# Patient Record
Sex: Male | Born: 1985
Health system: Southern US, Community
[De-identification: ages and names within clinical notes are randomized; demographics above are authoritative.]

## PROBLEM LIST (undated history)

## (undated) DIAGNOSIS — G4730 Sleep apnea, unspecified: Secondary | ICD-10-CM

## (undated) DIAGNOSIS — I10 Essential (primary) hypertension: Secondary | ICD-10-CM

## (undated) DIAGNOSIS — Z22322 Carrier or suspected carrier of Methicillin resistant Staphylococcus aureus: Secondary | ICD-10-CM

## (undated) DIAGNOSIS — E669 Obesity, unspecified: Secondary | ICD-10-CM

## (undated) DIAGNOSIS — I509 Heart failure, unspecified: Secondary | ICD-10-CM

## (undated) HISTORY — DX: Heart failure, unspecified: I50.9

## (undated) HISTORY — DX: Sleep apnea, unspecified: G47.30

## (undated) HISTORY — PX: ADENOIDECTOMY: SUR15

## (undated) HISTORY — PX: TONSILLECTOMY: SUR1361

---

## 2010-10-23 ENCOUNTER — Emergency Department: Payer: Self-pay | Admitting: Unknown Physician Specialty

## 2011-09-17 ENCOUNTER — Emergency Department: Payer: Self-pay | Admitting: *Deleted

## 2013-07-12 ENCOUNTER — Emergency Department: Payer: Self-pay | Admitting: Emergency Medicine

## 2013-10-21 ENCOUNTER — Emergency Department: Payer: Self-pay | Admitting: Emergency Medicine

## 2013-10-21 LAB — CBC
HCT: 43.6 % (ref 40.0–52.0)
HGB: 14.3 g/dL (ref 13.0–18.0)
MCH: 27.7 pg (ref 26.0–34.0)
MCHC: 32.9 g/dL (ref 32.0–36.0)
MCV: 84 fL (ref 80–100)
PLATELETS: 299 10*3/uL (ref 150–440)
RBC: 5.16 10*6/uL (ref 4.40–5.90)
RDW: 13.8 % (ref 11.5–14.5)
WBC: 10.5 10*3/uL (ref 3.8–10.6)

## 2013-10-21 LAB — BASIC METABOLIC PANEL
Anion Gap: 7 (ref 7–16)
BUN: 14 mg/dL (ref 7–18)
CALCIUM: 8.6 mg/dL (ref 8.5–10.1)
Chloride: 104 mmol/L (ref 98–107)
Co2: 25 mmol/L (ref 21–32)
Creatinine: 0.52 mg/dL — ABNORMAL LOW (ref 0.60–1.30)
EGFR (African American): >60
EGFR (Non-African Amer.): >60
Glucose: 115 mg/dL — ABNORMAL HIGH (ref 65–99)
OSMOLALITY: 273 (ref 275–301)
POTASSIUM: 3.9 mmol/L (ref 3.5–5.1)
Sodium: 136 mmol/L (ref 136–145)

## 2013-10-21 LAB — TROPONIN I: Troponin-I: <0.02 ng/mL

## 2014-06-21 ENCOUNTER — Emergency Department: Payer: Self-pay | Admitting: Emergency Medicine

## 2019-02-20 ENCOUNTER — Other Ambulatory Visit: Payer: Self-pay

## 2019-02-20 ENCOUNTER — Encounter: Payer: Self-pay | Admitting: Emergency Medicine

## 2019-02-20 DIAGNOSIS — Z800 Family history of malignant neoplasm of digestive organs: Secondary | ICD-10-CM

## 2019-02-20 DIAGNOSIS — Z1159 Encounter for screening for other viral diseases: Secondary | ICD-10-CM

## 2019-02-20 DIAGNOSIS — F17210 Nicotine dependence, cigarettes, uncomplicated: Secondary | ICD-10-CM | POA: Diagnosis present

## 2019-02-20 DIAGNOSIS — L89329 Pressure ulcer of left buttock, unspecified stage: Secondary | ICD-10-CM | POA: Diagnosis present

## 2019-02-20 DIAGNOSIS — Z6841 Body Mass Index (BMI) 40.0 and over, adult: Secondary | ICD-10-CM

## 2019-02-20 DIAGNOSIS — Z811 Family history of alcohol abuse and dependence: Secondary | ICD-10-CM

## 2019-02-20 DIAGNOSIS — L0231 Cutaneous abscess of buttock: Secondary | ICD-10-CM | POA: Diagnosis present

## 2019-02-20 DIAGNOSIS — Z833 Family history of diabetes mellitus: Secondary | ICD-10-CM

## 2019-02-20 DIAGNOSIS — L731 Pseudofolliculitis barbae: Secondary | ICD-10-CM | POA: Diagnosis present

## 2019-02-20 DIAGNOSIS — L89324 Pressure ulcer of left buttock, stage 4: Principal | ICD-10-CM | POA: Diagnosis present

## 2019-02-20 DIAGNOSIS — L03317 Cellulitis of buttock: Secondary | ICD-10-CM | POA: Diagnosis present

## 2019-02-20 DIAGNOSIS — I10 Essential (primary) hypertension: Secondary | ICD-10-CM | POA: Diagnosis present

## 2019-02-20 LAB — CBC WITH DIFFERENTIAL/PLATELET
Abs Immature Granulocytes: 0.07 10*3/uL (ref 0.00–0.07)
Basophils Absolute: 0.1 10*3/uL (ref 0.0–0.1)
Basophils Relative: 1 %
Eosinophils Absolute: 0.3 10*3/uL (ref 0.0–0.5)
Eosinophils Relative: 2 %
HCT: 43.4 % (ref 39.0–52.0)
Hemoglobin: 14.2 g/dL (ref 13.0–17.0)
Immature Granulocytes: 1 %
Lymphocytes Relative: 17 %
Lymphs Abs: 2.6 10*3/uL (ref 0.7–4.0)
MCH: 28 pg (ref 26.0–34.0)
MCHC: 32.7 g/dL (ref 30.0–36.0)
MCV: 85.4 fL (ref 80.0–100.0)
Monocytes Absolute: 1.4 10*3/uL — ABNORMAL HIGH (ref 0.1–1.0)
Monocytes Relative: 9 %
Neutro Abs: 10.9 10*3/uL — ABNORMAL HIGH (ref 1.7–7.7)
Neutrophils Relative %: 70 %
Platelets: 332 10*3/uL (ref 150–400)
RBC: 5.08 MIL/uL (ref 4.22–5.81)
RDW: 13.5 % (ref 11.5–15.5)
WBC: 15.4 10*3/uL — ABNORMAL HIGH (ref 4.0–10.5)
nRBC: 0 % (ref 0.0–0.2)

## 2019-02-20 NOTE — ED Triage Notes (Addendum)
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

## 2019-02-21 ENCOUNTER — Emergency Department: Payer: Self-pay

## 2019-02-21 ENCOUNTER — Inpatient Hospital Stay
Admission: EM | Admit: 2019-02-21 | Discharge: 2019-02-22 | DRG: 571 | Disposition: A | Discharge status: Other Acute Inpatient Hospital | Payer: Self-pay | Attending: Internal Medicine | Admitting: Internal Medicine

## 2019-02-21 ENCOUNTER — Encounter: Payer: Self-pay | Admitting: Radiology

## 2019-02-21 DIAGNOSIS — L03317 Cellulitis of buttock: Secondary | ICD-10-CM | POA: Diagnosis present

## 2019-02-21 DIAGNOSIS — L8990 Pressure ulcer of unspecified site, unspecified stage: Secondary | ICD-10-CM

## 2019-02-21 DIAGNOSIS — L0231 Cutaneous abscess of buttock: Secondary | ICD-10-CM

## 2019-02-21 HISTORY — DX: Obesity, unspecified: E66.9

## 2019-02-21 HISTORY — DX: Essential (primary) hypertension: I10

## 2019-02-21 LAB — LACTIC ACID, PLASMA: Lactic Acid, Venous: 1.2 mmol/L (ref 0.5–1.9)

## 2019-02-21 LAB — BASIC METABOLIC PANEL
Anion gap: 8 (ref 5–15)
BUN: 16 mg/dL (ref 6–20)
CO2: 26 mmol/L (ref 22–32)
Calcium: 8.5 mg/dL — ABNORMAL LOW (ref 8.9–10.3)
Chloride: 102 mmol/L (ref 98–111)
Creatinine, Ser: 0.57 mg/dL — ABNORMAL LOW (ref 0.61–1.24)
GFR calc Af Amer: >60 mL/min (ref >60)
GFR calc non Af Amer: >60 mL/min (ref >60)
Glucose, Bld: 144 mg/dL — ABNORMAL HIGH (ref 70–99)
Potassium: 3.5 mmol/L (ref 3.5–5.1)
Sodium: 136 mmol/L (ref 135–145)

## 2019-02-21 LAB — TSH: TSH: 2.074 u[IU]/mL (ref 0.350–4.500)

## 2019-02-21 LAB — HEMOGLOBIN A1C
Hgb A1c MFr Bld: 5.4 % (ref 4.8–5.6)
Mean Plasma Glucose: 108.28 mg/dL

## 2019-02-21 LAB — SARS CORONAVIRUS 2 BY RT PCR (HOSPITAL ORDER, PERFORMED IN ~~LOC~~ HOSPITAL LAB): SARS Coronavirus 2: NEGATIVE

## 2019-02-21 MED ORDER — ACETAMINOPHEN 325 MG PO TABS: 650.0000 mg | ORAL_TABLET | Freq: Four times a day (QID) | ORAL | Status: DC | PRN

## 2019-02-21 MED ORDER — ONDANSETRON HCL 4 MG PO TABS: 4.0000 mg | ORAL_TABLET | Freq: Four times a day (QID) | ORAL | Status: DC | PRN

## 2019-02-21 MED ORDER — ENOXAPARIN SODIUM 40 MG/0.4ML ~~LOC~~ SOLN
40.0000 mg | SUBCUTANEOUS | Status: DC
  Administered 2019-02-21: 40 mg via SUBCUTANEOUS
  Filled 2019-02-21: qty 0.4

## 2019-02-21 MED ORDER — CLINDAMYCIN PHOSPHATE 600 MG/50ML IV SOLN
600.0000 mg | Freq: Once | INTRAVENOUS | Status: AC
  Administered 2019-02-21: 600 mg via INTRAVENOUS
  Filled 2019-02-21: qty 50

## 2019-02-21 MED ORDER — PIPERACILLIN SOD-TAZOBACTAM SO 2.25 (2-0.25) G IV SOLR
4.5000 g | Freq: Three times a day (TID) | INTRAVENOUS | Status: DC
  Administered 2019-02-21: 4.5 g via INTRAVENOUS
  Filled 2019-02-21 (×3): qty 20

## 2019-02-21 MED ORDER — VANCOMYCIN HCL 10 G IV SOLR: 2500.0000 mg | Freq: Two times a day (BID) | INTRAVENOUS | Status: DC

## 2019-02-21 MED ORDER — OXYCODONE HCL 5 MG PO TABS
5.0000 mg | ORAL_TABLET | Freq: Four times a day (QID) | ORAL | Status: DC | PRN
  Administered 2019-02-21 – 2019-02-22 (×2): 5 mg via ORAL
  Filled 2019-02-21 (×2): qty 1

## 2019-02-21 MED ORDER — NICOTINE 14 MG/24HR TD PT24
14.0000 mg | MEDICATED_PATCH | Freq: Every day | TRANSDERMAL | Status: DC
  Filled 2019-02-21: qty 1

## 2019-02-21 MED ORDER — VANCOMYCIN HCL 10 G IV SOLR
2500.0000 mg | Freq: Two times a day (BID) | INTRAVENOUS | Status: DC
  Filled 2019-02-21: qty 2500

## 2019-02-21 MED ORDER — PIPERACILLIN-TAZOBACTAM 3.375 G IVPB 30 MIN
3.3750 g | Freq: Once | INTRAVENOUS | Status: DC
  Filled 2019-02-21: qty 50

## 2019-02-21 MED ORDER — DOCUSATE SODIUM 100 MG PO CAPS
100.0000 mg | ORAL_CAPSULE | Freq: Two times a day (BID) | ORAL | Status: DC
  Administered 2019-02-21 (×2): 100 mg via ORAL
  Filled 2019-02-21 (×2): qty 1

## 2019-02-21 MED ORDER — MORPHINE SULFATE (PF) 4 MG/ML IV SOLN
4.0000 mg | Freq: Once | INTRAVENOUS | Status: AC
  Administered 2019-02-21: 4 mg via INTRAVENOUS
  Filled 2019-02-21: qty 1

## 2019-02-21 MED ORDER — ACETAMINOPHEN 650 MG RE SUPP: 650.0000 mg | Freq: Four times a day (QID) | RECTAL | Status: DC | PRN

## 2019-02-21 MED ORDER — LIDOCAINE-EPINEPHRINE 2 %-1:100000 IJ SOLN
30.0000 mL | Freq: Once | INTRAMUSCULAR | Status: AC
  Administered 2019-02-21: 30 mL
  Filled 2019-02-21: qty 2

## 2019-02-21 MED ORDER — IOHEXOL 300 MG/ML  SOLN
125.0000 mL | Freq: Once | INTRAMUSCULAR | Status: AC | PRN
  Administered 2019-02-21: 150 mL via INTRAVENOUS

## 2019-02-21 MED ORDER — CLINDAMYCIN PHOSPHATE 900 MG/50ML IV SOLN
900.0000 mg | Freq: Three times a day (TID) | INTRAVENOUS | Status: DC
  Administered 2019-02-21 – 2019-02-22 (×4): 900 mg via INTRAVENOUS
  Filled 2019-02-21 (×5): qty 50

## 2019-02-21 MED ORDER — PIPERACILLIN-TAZOBACTAM 4.5 G IVPB
4.5000 g | Freq: Three times a day (TID) | INTRAVENOUS | Status: DC
  Filled 2019-02-21 (×2): qty 100

## 2019-02-21 MED ORDER — ENOXAPARIN SODIUM 40 MG/0.4ML ~~LOC~~ SOLN
40.0000 mg | Freq: Two times a day (BID) | SUBCUTANEOUS | Status: DC
  Administered 2019-02-21: 22:00:00 40 mg via SUBCUTANEOUS
  Filled 2019-02-21: qty 0.4

## 2019-02-21 MED ORDER — VANCOMYCIN HCL 1.5 G IV SOLR
1500.0000 mg | Freq: Three times a day (TID) | INTRAVENOUS | Status: DC
  Administered 2019-02-21 – 2019-02-22 (×3): 1500 mg via INTRAVENOUS
  Filled 2019-02-21 (×5): qty 1500

## 2019-02-21 MED ORDER — ONDANSETRON HCL 4 MG/2ML IJ SOLN: 4.0000 mg | Freq: Four times a day (QID) | INTRAMUSCULAR | Status: DC | PRN

## 2019-02-21 MED ORDER — VANCOMYCIN HCL 10 G IV SOLR
2500.0000 mg | Freq: Once | INTRAVENOUS | Status: AC
  Administered 2019-02-21: 2500 mg via INTRAVENOUS
  Filled 2019-02-21: qty 2500

## 2019-02-21 MED ORDER — PIPERACILLIN-TAZOBACTAM 3.375 G IVPB
3.3750 g | Freq: Three times a day (TID) | INTRAVENOUS | Status: DC
  Administered 2019-02-21 – 2019-02-22 (×4): 3.375 g via INTRAVENOUS
  Filled 2019-02-21 (×4): qty 50

## 2019-02-21 NOTE — Progress Notes (Signed)
Pharmacy Antibiotic Note  Christopher Keith is a 33 y.o. male admitted on 02/21/2019 with wound infection.  Pharmacy has been consulted for Vancomycin, Zosyn, clindamycin dosing.  Pt is morbidly obese (BMI 72.7). Wound infection of left buttock, concerned for necrotizing fasciitis.   Plan: Will start Zosyn 3.375 gm IV Q8H.  Pt received vancomycin 2500 mg IV X 1 5/17 @0851 , will order a maintenance dose of 1500 mg q8H. Predicted AUC 410. Goal AUC 400-550. Calculated Css min 12.8. Used Scr 0.8 for calculations.  No peak or trough currently ordered.   Will order clindamycin 900 mg q8H due to concern for necrotizing infection.   Height: 5\' 7"  (170.2 cm) Weight: (!) 464 lb 9 oz (210.7 kg) IBW/kg (Calculated) : 66.1  Temp (24hrs), Avg:98.6 F (37 C), Min:98.4 F (36.9 C), Max:98.9 F (37.2 C)  Recent Labs  Lab 02/20/19 2346 02/21/19 0435  WBC 15.4*  --   CREATININE 0.57*  --   LATICACIDVEN  --  1.2    Estimated Creatinine Clearance: 225.9 mL/min (A) (by C-G formula based on SCr of 0.57 mg/dL (L)).    No Known Allergies  Antimicrobials this admission: 5/17 pip/tazo >> 5/17 vancomycin  >>  5/17 Clindamycin >>   Dose adjustments this admission: None  Microbiology results:  5/17 BCx: pending  Thank you for allowing pharmacy to be a part of this patient's care.  Ronnald Ramp, PharmD, BCPS 02/21/2019 10:10 AM

## 2019-02-21 NOTE — ED Notes (Signed)
Dr. Forbach at bedside.  

## 2019-02-21 NOTE — Procedures (Addendum)
ATTENDING Surgeon(s): Carolan Shiver, MD   ANESTHESIA: General   PRE-OPERATIVE DIAGNOSIS: left gluteal abscess with necrotic tissue   POST-OPERATIVE DIAGNOSIS: left gluteal abscess with necrotic tissue   PROCEDURE(S):  1.) Sharp excisional debridement of left gluteal infected and necrotic tissueulcer down to subcutaneous tissue    INTRAOPERATIVE FINDINGS:  A 6 cm x 4 cm and 1 cm deep stage four sacral ulcer without purulent secretions   ESTIMATED BLOOD LOSS: Minimal (<10 mL)      SPECIMENS: abscess culture   COMPLICATIONS: None apparent   CONDITION AT END OF PROCEDURE: Hemodynamically stable and awake   INDICATIONS FOR PROCEDURE:  Patient is 33 y.o. with left gluteal abscess with necrotic tissue and purulent discharge. Patient with leukocytosis and purulent secretions.      DETAILS OF PROCEDURE: After informed consent,patient was positioned. Time out performed. Local anesthesia infiltrated. Patient placed on right decubitus position. The left gluteal area was cleaned and draped in sterile fashion. With #11 blade, incision was done and purulent secretion drained.  Necrotic tissue from the was resected with scissors. Hemostasis achieved. Wet to dry dressing was applied. Patient tolerated the procedure well.

## 2019-02-21 NOTE — Consult Note (Signed)
SURGICAL CONSULTATION NOTE   HISTORY OF PRESENT ILLNESS (HPI):  33 y.o. male presented to [facility] ED for evaluation of left border abscess. Patient reports he has been feeling in it for pain since few days ago.  Reports that he has been trying to take care of it but is progressively getting worse.  Patient has tried to drain it very recently has minimal possible.  Denies fever chills.  Reports previous episode of scrotal abscess.  Pain localized in the left buttock area.  Pain does not radiate towards the body.  Pain aggravated by pressure.  There is no alleviating factor.  At ED patient found with leukocytosis.  CT scan of the pelvis shows phlegmonous process on the liver area but no drainable collection.  Surgery is consulted by Dr. York Cerise in this context for evaluation and management of left gluteal abscess.  PAST MEDICAL HISTORY (PMH):  Past Medical History:  Diagnosis Date  . Hypertension   . Obesity      PAST SURGICAL HISTORY [facility]):  Past Surgical History:  Procedure Laterality Date  . ADENOIDECTOMY    . TONSILLECTOMY       MEDICATIONS:  Prior to Admission medications   Not on File     ALLERGIES:  No Known Allergies   SOCIAL HISTORY:  Social History   Socioeconomic History  . Marital status: Married    Spouse name: Not on file  . Number of children: Not on file  . Years of education: Not on file  . Highest education level: Not on file  Occupational History  . Not on file  Social Needs  . Financial resource strain: Not on file  . Food insecurity:    Worry: Not on file    Inability: Not on file  . Transportation needs:    Medical: Not on file    Non-medical: Not on file  Tobacco Use  . Smoking status: Current Some Day Smoker    Types: Cigarettes  . Smokeless tobacco: Never Used  Substance and Sexual Activity  . Alcohol use: Yes  . Drug use: Never  . Sexual activity: Not on file  Lifestyle  . Physical activity:    Days per week: Not on file    Minutes  per session: Not on file  . Stress: Not on file  Relationships  . Social connections:    Talks on phone: Not on file    Gets together: Not on file    Attends religious service: Not on file    Active member of club or organization: Not on file    Attends meetings of clubs or organizations: Not on file    Relationship status: Not on file  . Intimate partner violence:    Fear of current or ex partner: Not on file    Emotionally abused: Not on file    Physically abused: Not on file    Forced sexual activity: Not on file  Other Topics Concern  . Not on file  Social History Narrative  . Not on file     FAMILY HISTORY:  History reviewed. No pertinent family history.   REVIEW OF SYSTEMS:  Constitutional: denies weight loss, fever, chills, or sweats  Eyes: denies any other vision changes, history of eye injury  ENT: denies sore throat, hearing problems  Respiratory: denies shortness of breath, wheezing  Cardiovascular: denies chest pain, palpitations  Gastrointestinal: denies abdominal pain, N/V, or diarrhea Genitourinary: denies burning with urination or urinary frequency Musculoskeletal: denies any other joint pains or  cramps  Skin: denies any other rashes or skin discolorations  Neurological: denies any other headache, dizziness, weakness  Psychiatric: denies any other depression, anxiety   All other review of systems were negative   VITAL SIGNS:  Temp:  [98.5 F (36.9 C)-98.9 F (37.2 C)] 98.5 F (36.9 C) (05/17 0252) Pulse Rate:  [85-92] 85 (05/17 0252) Resp:  [19-20] 20 (05/17 0252) BP: (158-180)/(74-97) 158/97 (05/17 0252) SpO2:  [98 %] 98 % (05/17 0252) Weight:  [210.7 kg] 210.7 kg (05/16 2335)     Height: 5\' 7"  (170.2 cm) Weight: (!) 210.7 kg BMI (Calculated): 72.74   PHYSICAL EXAM:  Constitutional:  --Morbid obesity -- Awake, alert, and oriented x3  Eyes:  -- Pupils equally round and reactive to light  -- No scleral icterus  Ear, nose, and throat:  -- No  jugular venous distension  Pulmonary:  -- No crackles  -- Equal breath sounds bilaterally -- Breathing non-labored at rest Cardiovascular:  -- S1, S2 present  -- No pericardial rubs Gastrointestinal:  -- Abdomen soft, nontender, non-distended, no guarding or rebound tenderness -- No abdominal masses appreciated, pulsatile or otherwise  Musculoskeletal and Integumentary:  -- Wounds or skin discoloration: Left gluteal area of necrotic tissue measure 4 x 6 cm.  Superficial necrotic tissue with purulent secretion.  There is no crepitus.  There is associated cellulitis. -- Extremities: B/L UE and LE FROM, hands and feet warm, no edema  Neurologic:  -- Motor function: intact and symmetric -- Sensation: intact and symmetric   Labs:  CBC Latest Ref Rng & Units 02/20/2019 10/21/2013  WBC 4.0 - 10.5 K/uL 15.4(H) 10.5  Hemoglobin 13.0 - 17.0 g/dL 16.114.2 09.614.3  Hematocrit 04.539.0 - 52.0 % 43.4 43.6  Platelets 150 - 400 K/uL 332 299   CMP Latest Ref Rng & Units 02/20/2019 10/21/2013  Glucose 70 - 99 mg/dL 409(W144(H) 119(J115(H)  BUN 6 - 20 mg/dL 16 14  Creatinine 4.780.61 - 1.24 mg/dL 2.95(A0.57(L) 2.13(Y0.52(L)  Sodium 135 - 145 mmol/L 136 136  Potassium 3.5 - 5.1 mmol/L 3.5 3.9  Chloride 98 - 111 mmol/L 102 104  CO2 22 - 32 mmol/L 26 25  Calcium 8.9 - 10.3 mg/dL 8.6(V8.5(L) 8.6     Imaging studies:  EXAM: CT PELVIS WITH CONTRAST  TECHNIQUE: Multidetector CT imaging of the pelvis was performed using the standard protocol following the bolus administration of intravenous contrast.  CONTRAST:  150mL OMNIPAQUE IOHEXOL 300 MG/ML  SOLN  COMPARISON:  None.  FINDINGS: Urinary Tract:  Bladder is within normal limits.  Bowel:  Visualized bowel is unremarkable.  Vascular/Lymphatic: No evidence of aneurysm.  Small retroperitoneal nodes, including 11 mm short axis left common iliac node (series 3/image 20). Small pelvic nodes bilaterally, including a 14 mm short axis left external iliac node (series 3/image 87),  these are mildly prominent although likely reactive.  Reproductive:  Prostate is unremarkable.  Other:  No pelvic ascites.  Musculoskeletal: Inflammatory stranding/phlegmonous change in the medial left buttock adjacent to the gluteal cleft (series 11/image 97). No discrete fluid collection/abscess.  Mild degenerative changes of the lower lumbar spine.  IMPRESSION: Inflammatory stranding/phlegmonous change in the medial left buttock adjacent to the gluteal cleft, reflecting cellulitis. No discrete fluid collection/abscess.  Small retroperitoneal/pelvic lymph nodes, likely reactive.   Electronically Signed   By: Charline BillsSriyesh  Krishnan M.D.   On: 02/21/2019 05:17  Assessment/Plan:  33 y.o. male with left gluteal abscess, complicated by pertinent comorbidities including morbid obesity of 72 of BMI. And within the  gluteal abscess and associated phlegmon with necrotic tissue.  Due to the amount of necrotic tissue and leukocytosis I assess that the patient will benefit of incision and drainage and debridement of the tissue.  I discussed the case with anesthesia and an upper comfortable performing anesthesia on this patient.  I discussed this with the patient and he approved to proceed with bedside debridement and incision and drainage.  I discussed with the patient the benefit of the procedure which will be control infection. After debridement of the area not having any more fluid collection that needs to be drained I recommend the patient to have IV antibiotic therapy for 24-48 hours for the cellulitis.  I will gladly follow the patient for wound care.  Gae Gallop, MD

## 2019-02-21 NOTE — Progress Notes (Signed)
See the patient at bedside. Vital signs are stable.  Labs reviewed. Left gluteal abscess with necrotic tissue Continue antibiotics, follow-up CBC. Morbid obesity.  Diet control and exercise advised. Tobacco abuse.  Smoking cessation was counseled for 3 to 4 minutes. I discussed with the patient and RN.  Time spent about 20 minutes.

## 2019-02-21 NOTE — ED Provider Notes (Signed)
[facility] Emergency Department Provider Note  ____________________________________________   First MD Initiated Contact with Patient 02/21/19 [phone]     (approximate)  I have reviewed the triage vital signs and the nursing notes.   HISTORY  Chief Complaint Abscess    HPI Christopher Keith is a 33 y.o. male with a history of morbid obesity but no other known diagnoses other than a prior episode about 7 months ago of an abscess on his scrotum.  He presents tonight with what he describes as a gradually worsening area of infection on his left buttock.  He reports that it is painful when he sits but otherwise it is not painful.  He has been trying to keep clean and scrubs himself including that area with a brush but he has a difficult time seeing it both due to the location and his size.  He finally came in tonight because it has been draining some fluid and it has become very painful when he sits down.  He required drainage of an abscess on his scrotum 7 months ago as described above but he said that there is no scrotal involvement this time and that the area is isolated to the left buttock.  He denies fever/chills, sore throat, chest pain, shortness of breath, cough, nausea, vomiting, abdominal pain, and dysuria.  He does not have a diagnosis of diabetes that he is aware of but he does not have a doctor  or receive regular medical care.  He rarely uses alcohol and occasionally smokes tobacco.  He thinks it may have started as an ingrown hair or another small infection and he was able to squeeze out some pus and blood a few days ago but it has gotten much bigger and much worse.  He now describes it as severe.        Past Medical History:  Diagnosis Date   Hypertension    Obesity     There are no active problems to display for this patient.   Past Surgical History:  Procedure Laterality Date   ADENOIDECTOMY     TONSILLECTOMY      Prior to Admission medications    Not on File    Allergies Patient has no known allergies.  History reviewed. No pertinent family history.  Social History Social History   Tobacco Use   Smoking status: Current Some Day Smoker    Types: Cigarettes   Smokeless tobacco: Never Used  Substance Use Topics   Alcohol use: Yes   Drug use: Never    Review of Systems Constitutional: No fever/chills Eyes: No visual changes. ENT: No sore throat. Cardiovascular: Denies chest pain. Respiratory: Denies shortness of breath. Gastrointestinal: No abdominal pain.  No nausea, no vomiting.  No diarrhea.  No constipation. Genitourinary: Negative for dysuria. Musculoskeletal: Negative for neck pain.  Negative for back pain. Integumentary: Large area of draining infection on his left buttock. Neurological: Negative for headaches, focal weakness or numbness.   ____________________________________________   PHYSICAL EXAM:  VITAL SIGNS: ED Triage Vitals  Enc Vitals Group     BP 02/20/19 2334 (!) 180/74     Pulse Rate 02/20/19 2334 92     Resp 02/20/19 2334 19     Temp 02/20/19 2334 98.9 F (37.2 C)     Temp Source 02/20/19 2334 Oral     SpO2 02/20/19 2334 98 %     Weight 02/20/19 2335 (!) 210.7 kg (464 lb 9 oz)     Height 02/20/19 2335  1.702 m ( )     Head Circumference --      Peak Flow --      Pain Score 02/20/19 2335 4     Pain Loc --      Pain Edu? --      Excl. in GC? --     Constitutional: Alert and oriented. Well appearing and in no acute distress. Eyes: Conjunctivae are normal.  Head: Atraumatic. Nose: No congestion/rhinnorhea. Mouth/Throat: Mucous membranes are moist. Neck: No stridor.  No meningeal signs.   Cardiovascular: Normal rate, regular rhythm. Good peripheral circulation. Grossly normal heart sounds. Respiratory: Normal respiratory effort.  No retractions. No audible wheezing. Gastrointestinal: Morbid obesity.  Soft and nontender. No distention.  GU: Normal-appearing scrotum and  penis.  No erythema, edema, tenderness, or crepitus identified in and around the scrotum nor perineum. Musculoskeletal: No lower extremity tenderness nor edema. No gross deformities of extremities. Neurologic:  Normal speech and language. No gross focal neurologic deficits are appreciated.  Psychiatric: Mood and affect are normal. Speech and behavior are normal. Skin:  Skin is warm, dry and intact except for the left buttock.  He has a very large area of induration that is at least 10 to 12 cm in diameter and in width with a slightly smaller area of erythema and central purulence and fluctuance.  It is isolated to the left buttock but it takes up most of the buttock itself.  It does not track to his anus.  In addition to the fluctuance and induration and surrounding cellulitis, there is a large central area of purulence versus acute necrosis.  There is no central eschar.  The wound is bleeding and discharging a purulent material.  It is tender to palpation but the tenderness is not out of proportion.  There is no crepitus of the infected region.     ____________________________________________   LABS (all labs ordered are listed, but only abnormal results are displayed)  Labs Reviewed  CBC WITH DIFFERENTIAL/PLATELET - Abnormal; Notable for the following components:      Result Value   WBC 15.4 (*)    Neutro Abs 10.9 (*)    Monocytes Absolute 1.4 (*)    All other components within normal limits  BASIC METABOLIC PANEL - Abnormal; Notable for the following components:   Glucose, Bld 144 (*)    Creatinine, Ser 0.57 (*)    Calcium 8.5 (*)    All other components within normal limits  SARS CORONAVIRUS 2 (HOSPITAL ORDER, PERFORMED IN [facility])  CULTURE, BLOOD (ROUTINE X 2)  CULTURE, BLOOD (ROUTINE X 2)  LACTIC ACID, PLASMA  HEMOGLOBIN A1C   ____________________________________________  EKG  No indication for  EKG ____________________________________________  RADIOLOGY   ED MD interpretation: Phlegmon with no obvious drainable fluid collection in the left buttock  Official radiology report(s): Ct Pelvis W Contrast  Result Date: 02/21/2019 CLINICAL DATA:  Left buttock abscess EXAM: CT PELVIS WITH CONTRAST TECHNIQUE: Multidetector CT imaging of the pelvis was performed using the standard protocol following the bolus administration of intravenous contrast. CONTRAST:  OMNIPAQUE IOHEXOL 300 MG/ML  SOLN COMPARISON:  None. FINDINGS: Urinary Tract:  Bladder is within normal limits. Bowel:  Visualized bowel is unremarkable. Vascular/Lymphatic: No evidence of aneurysm. Small retroperitoneal nodes, including 11 mm short axis left common iliac node (series 3/image 20). Small pelvic nodes bilaterally, including a 14 mm short axis left external iliac node (series 3/image 87), these are mildly prominent although likely reactive. Reproductive:  Prostate  is unremarkable. Other:  No pelvic ascites. Musculoskeletal: Inflammatory stranding/phlegmonous change in the medial left buttock adjacent to the gluteal cleft (series 11/image 97). No discrete fluid collection/abscess. Mild degenerative changes of the lower lumbar spine. IMPRESSION: Inflammatory stranding/phlegmonous change in the medial left buttock adjacent to the gluteal cleft, reflecting cellulitis. No discrete fluid collection/abscess. Small retroperitoneal/pelvic lymph nodes, likely reactive. Electronically Signed   By: Charline BillsSriyesh  Krishnan M.D.   On: 02/21/2019 05:17    ____________________________________________   PROCEDURES   Procedure(s) performed (including Critical Care):  Procedures   ____________________________________________   INITIAL IMPRESSION / MDM / ASSESSMENT AND PLAN / ED COURSE  As part of my medical decision making, I reviewed the following data within the electronic MEDICAL RECORD NUMBER Nursing notes reviewed and incorporated, Labs  reviewed , Old chart reviewed, Discussed with admitting physician (Dr. Sheryle Hailiamond with the hospitalist service) , A consult was requested and obtained from this/these consultant(s) Surgery, Notes from prior ED visits and [place] Controlled Substance Database      *Christopher EmeryJuan Keith was evaluated in Emergency Department on 02/21/2019 for the symptoms described in the history of present illness. He was evaluated in the context of the global COVID-19 pandemic, which necessitated consideration that the patient might be at risk for infection with the SARS-CoV-2 virus that causes COVID-19. Institutional protocols and algorithms that pertain to the evaluation of patients at risk for COVID-19 are in a state of rapid change based on information released by regulatory bodies including the CDC and federal and state organizations. These policies and algorithms were followed during the patient's care in the ED.  Some ED evaluations and interventions may be delayed as a result of limited staffing during the pandemic.*  Differential diagnosis includes, but is not limited to, large left buttock abscess and surrounding cellulitis, necrotizing fasciitis/Fournier's gangrene, bacteremia.  According to the patient this wound has developed over the last week and it is currently severe.  In spite of this wound he is well-appearing and in no distress with normal and stable vital signs, no tachycardia, no fever.  Lab work is notable for an essentially normal basic metabolic panel and a leukocytosis of 16.  Given the extent of the wound I am calling Dr. Maia Planintron the general surgeon that is currently on call and asking him to come evaluate the patient in person and admit him to the operating room for drainage versus debridement.  We are establishing IV access and I have ordered a COVID-19 swab given the patient's need for admission.  He does not meet sepsis criteria.  Clinical Course as of Feb 20 637  Wynelle LinkSun Feb 21, 2019  0401 Discussed case by phone  with Dr. Maia Planintron.  He will come to the ED to evaluate the patinet.   [CF]  0402 I ordered blood cultures x 2, lactic acid, COVID-19 swab, and hemoglobin A1c (for follow up).   [CF]  [phone] Upon reconsideration, rather than just awaiting bedside consult with Dr. Maia Planintron, which I still believe is appropriate, I will also evaluate with a CT scan of the pelvis with IV contrast to rule out necrotizing fasciitis given the odd and concerning appearance of the wound even though he does not have significant pain and is hemodynamically stable.  I [facility] a secure chat [facility] Maia Planintron who agreed with this plan.  Given the concerns, I have also ordered empiric antibiotics for possible necrotizing fasciitis which includes clindamycin 600 mg IV, vancomycin 2 g IV loading dose, and Zosyn 3.375  g IV.  I also ordered a pharmacy consult given the patient's morbid obesity.   [CF]  0503 Lactic Acid, Venous: 1.2 [CF]  0531 CT scan was reassuring with no sign of necrotizing fasciitis.  I discussed the case in person with Dr. Cintron after he evaluated the Maia Planent in the emergency department.  He agreed that it would be ideal to perform the incision and drainage in the operating room under anesthesia, but he discussed the case with the anesthesiologist who refused the case because of the patient's super morbid obesity.  We discussed options and Dr. Maia Plan talked to the patient about performing a bedside incision and drainage and the patient agreed with the plan.  He will let me know after the procedure whether or not he feels the patient needs to come into the hospital for 24 hours or more of IV antibiotics.   [CF]  0543 SARS Coronavirus 2: NEGATIVE [CF]  0621 Dr. Maia Plan performed the bedside incision and drainage with minimal purulent discharge.  He deroofed the necrotic portion of the abscess.  He agrees that the patient would benefit him coming into the hospital service for 24 hours of IV antibiotics.  Dr.  Maia Plan will continue to follow as a consult service and they can reassess after about 24 hours to determine if he needs additional IV treatment or if he is improving sufficiently for discharge.  I have paged the hospitalist service to discuss the case with Dr. Sheryle Hail.   [CF]  [phone] I discussed the case by phone with Dr. Sheryle Hail with the hospitalist service.  He will admit for cellulitis of the left buttock as described previously.  See the note from Dr. Maia Plan for additional details.   [CF]    Clinical Course User Index [CF] Loleta Rose, MD     ____________________________________________  FINAL CLINICAL IMPRESSION(S) / ED DIAGNOSES  Final diagnoses:  Left buttock abscess  Cellulitis of left buttock     MEDICATIONS GIVEN DURING THIS VISIT:  Medications  vancomycin (VANCOCIN) 2,500 mg in sodium chloride 0.9 % 500 mL IVPB (has no administration in time range)  piperacillin-tazobactam (ZOSYN) 4.5 g in dextrose 5 % 50 mL IVPB (has no administration in time range)  lidocaine-EPINEPHrine (XYLOCAINE W/EPI) 2 %-1:100000 (with pres) injection 30 mL (has no administration in time range)  clindamycin (CLEOCIN) IVPB 600 mg (0 mg Intravenous Stopped 02/21/19 0629)  iohexol (OMNIPAQUE) 300 MG/ML solution 125 mL (150 mLs Intravenous Contrast Given 02/21/19 0447)  morphine 4 MG/ML injection 4 mg (4 mg Intravenous Given 02/21/19 2130)     ED Discharge Orders    None       Note:  This document was prepared using Dragon voice recognition software and may include unintentional dictation errors.   Loleta Rose, MD 02/21/19 [phone]

## 2019-02-21 NOTE — ED Notes (Signed)
Report given to Megan, RN.

## 2019-02-21 NOTE — Progress Notes (Signed)
PHARMACIST - PHYSICIAN COMMUNICATION  CONCERNING:  Enoxaparin (Lovenox) for DVT Prophylaxis    RECOMMENDATION: Patient was prescribed enoxaprin 40mg  q24 hours for VTE prophylaxis.   Filed Weights   02/20/19 2335  Weight: (!) 464 lb 9 oz (210.7 kg)    Body mass index is 72.76 kg/m.  Estimated Creatinine Clearance: 225.9 mL/min (A) (by C-G formula based on SCr of 0.57 mg/dL (L)).   Based on [facility] policy patient is candidate for enoxaparin 40mg  every 12 hour dosing due to BMI being >40.  DESCRIPTION: Pharmacy has adjusted enoxaparin dose per [facility] policy.  Patient is now receiving enoxaparin 40mg  every 12 hours.   Ronnald Ramp, PharmD, BCPS Clinical Pharmacist  02/21/2019 1:58 PM

## 2019-02-21 NOTE — ED Notes (Signed)
ED TO INPATIENT HANDOFF REPORT  ED Nurse Name and Phone #: Rayaan Garguilo [phone]  S Name/Age/Gender Christopher Keith 33 y.o. male Room/Bed: ED13A/ED13A  Code Status   Code Status: Not on file  Home/SNF/Other Home A/Ox4 Is this baseline? Yes   Triage Complete: Triage complete  Chief Complaint abscess l buttock  Triage Note Pt reports abscess to left buttock that he noticed about a week ago; large area of redness, soft in the middle with large area of redness surrounding; pt reports being homeless and has been showering at the truck stop; is concerned the area is MRSA positive; some drainage noted; pt reports abscess on scrotum last fall that cleared after a round of bactim   Allergies No Known Allergies  Level of Care/Admitting Diagnosis ED Disposition    ED Disposition Condition Comment   Admit  Hospital Area: [facility] [[id number]]  Level of Care: Med-Surg [16]  Covid Evaluation: Screening Protocol (No Symptoms)  Diagnosis: Cellulitis [161096]  Admitting Physician: Arnaldo Natal [[id number]]  Attending Physician: Arnaldo Natal [phone]  PT Class (Do Not Modify): Observation [104]  PT Acc Code (Do Not Modify): Observation [10022]       B Medical/Surgery History Past Medical History:  Diagnosis Date  . Hypertension   . Obesity    Past Surgical History:  Procedure Laterality Date  . ADENOIDECTOMY    . TONSILLECTOMY       A IV Location/Drains/Wounds Patient Lines/Drains/Airways Status   Active Line/Drains/Airways    Name:   Placement date:   Placement time:   Site:   Days:   Peripheral IV 02/21/19 Right Forearm   02/21/19    0640    Forearm   less than 1          Intake/Output Last 24 hours No intake or output data in the 24 hours ending 02/21/19 0655  Labs/Imaging Results for orders placed or performed during the hospital encounter of 02/21/19 (from the past 48 hour(s))  CBC with Differential     Status: Abnormal   Collection Time:  02/20/19 11:46 PM  Result Value Ref Range   WBC 15.4 (H) 4.0 - 10.5 K/uL   RBC 5.08 4.22 - 5.81 MIL/uL   Hemoglobin 14.2 13.0 - 17.0 g/dL   HCT 29.5 62.1 - 30.8 %   MCV 85.4 80.0 - 100.0 fL   MCH 28.0 26.0 - 34.0 pg   MCHC 32.7 30.0 - 36.0 g/dL   RDW 65.7 84.6 - 96.2 %   Platelets 332 150 - 400 K/uL   nRBC 0.0 0.0 - 0.2 %   Neutrophils Relative % 70 %   Neutro Abs 10.9 (H) 1.7 - 7.7 K/uL   Lymphocytes Relative 17 %   Lymphs Abs 2.6 0.7 - 4.0 K/uL   Monocytes Relative 9 %   Monocytes Absolute 1.4 (H) 0.1 - 1.0 K/uL   Eosinophils Relative 2 %   Eosinophils Absolute 0.3 0.0 - 0.5 K/uL   Basophils Relative 1 %   Basophils Absolute 0.1 0.0 - 0.1 K/uL   Immature Granulocytes 1 %   Abs Immature Granulocytes 0.07 0.00 - 0.07 K/uL    Comment: Performed at [facility], [address], [place], [place] [address]  Basic metabolic panel     Status: Abnormal   Collection Time: 02/20/19 11:46 PM  Result Value Ref Range   Sodium 136 135 - 145 mmol/L   Potassium 3.5 3.5 - 5.1 mmol/L   Chloride 102 98 - 111 mmol/L  CO2 26 22 - 32 mmol/L   Glucose, Bld 144 (H) 70 - 99 mg/dL   BUN 16 6 - 20 mg/dL   Creatinine, Ser 1.610.57 (L) 0.61 - 1.24 mg/dL   Calcium 8.5 (L) 8.9 - 10.3 mg/dL   GFR calc non Af Amer >60 >60 mL/min   GFR calc Af Amer >60 >60 mL/min   Anion gap 8 5 - 15    Comment: Performed at [facility], [address] [place], [place] [address]  SARS Coronavirus 2 (CEPHEID - Performed in [facility] hospital lab), Hosp Order     Status: None   Collection Time: 02/21/19  4:34 AM  Result Value Ref Range   SARS Coronavirus 2 NEGATIVE NEGATIVE    Comment: (NOTE) If result is NEGATIVE SARS-CoV-2 target nucleic acids are NOT DETECTED. The SARS-CoV-2 RNA is generally detectable in upper and lower  respiratory specimens during the acute phase of infection. The lowest  concentration of SARS-CoV-2 viral copies this assay can detect is 250  copies / mL. A negative result  does not preclude SARS-CoV-2 infection  and should not be used as the sole basis for treatment or other  patient management decisions.  A negative result may occur with  improper specimen collection / handling, submission of specimen other  than nasopharyngeal swab, presence of viral mutation(s) within the  areas targeted by this assay, and inadequate number of viral copies  (<250 copies / mL). A negative result must be combined with clinical  observations, patient history, and epidemiological information. If result is POSITIVE SARS-CoV-2 target nucleic acids are DETECTED. The SARS-CoV-2 RNA is generally detectable in upper and lower  respiratory specimens dur ing the acute phase of infection.  Positive  results are indicative of active infection with SARS-CoV-2.  Clinical  correlation with patient history and other diagnostic information is  necessary to determine patient infection status.  Positive results do  not rule out bacterial infection or co-infection with other viruses. If result is PRESUMPTIVE POSTIVE SARS-CoV-2 nucleic acids MAY BE PRESENT.   A presumptive positive result was obtained on the submitted specimen  and confirmed on repeat testing.  While 2019 novel coronavirus  (SARS-CoV-2) nucleic acids may be present in the submitted sample  additional confirmatory testing may be necessary for epidemiological  and / or clinical management purposes  to differentiate between  SARS-CoV-2 and other Sarbecovirus currently known to infect humans.  If clinically indicated additional testing with an alternate test  methodology [phone] is advised. The SARS-CoV-2 RNA is generally  detectable in upper and lower respiratory sp ecimens during the acute  phase of infection. The expected result is Negative. Fact Sheet for Patients:  BoilerBrush.com.cyhttps://www.fda.gov/media/136312/download Fact Sheet for Healthcare Providers: https://pope.com/https://www.fda.gov/media/136313/download This test is not yet approved or  cleared by the [place] FDA and has been authorized for detection and/or diagnosis of SARS-CoV-2 by FDA under an Emergency Use Authorization (EUA).  This EUA will remain in effect (meaning this test can be used) for the duration of the COVID-19 declaration under Section 564(b)(1) of the Act, 21 U.S.C. section 360bbb-3(b)(1), unless the authorization is terminated or revoked sooner. Performed at [facility], [address] [place], [place] [address]   Blood culture (routine x 2)     Status: None (Preliminary result)   Collection Time: 02/21/19  4:35 AM  Result Value Ref Range   Specimen Description BLOOD RIGHT ANTECUBITAL    Special Requests      BOTTLES DRAWN AEROBIC AND ANAEROBIC  Blood Culture adequate volume   Culture      NO GROWTH <12 HOURS Performed at [facility], [address] [place], [place] [address]    Report Status PENDING   Blood culture (routine x 2)     Status: None (Preliminary result)   Collection Time: 02/21/19  4:35 AM  Result Value Ref Range   Specimen Description BLOOD LEFT ANTECUBITAL    Special Requests      BOTTLES DRAWN AEROBIC AND ANAEROBIC Blood Culture results may not be optimal due to an excessive volume of blood received in culture bottles   Culture      NO GROWTH <12 HOURS Performed at [facility] [address] [place], [place] [address]    Report Status PENDING   Lactic acid, plasma     Status: None   Collection Time: 02/21/19  4:35 AM  Result Value Ref Range   Lactic Acid, Venous 1.2 0.5 - 1.9 mmol/L    Comment: Performed at [facility], [address] [place], [place] [address]   Ct Pelvis W Contrast  Result Date: 02/21/2019 CLINICAL DATA:  Left buttock abscess EXAM: CT PELVIS WITH CONTRAST TECHNIQUE: Multidetector CT imaging of the pelvis was performed using the standard protocol following the bolus administration of intravenous contrast. CONTRAST:  OMNIPAQUE IOHEXOL 300 MG/ML   SOLN COMPARISON:  None. FINDINGS: Urinary Tract:  Bladder is within normal limits. Bowel:  Visualized bowel is unremarkable. Vascular/Lymphatic: No evidence of aneurysm. Small retroperitoneal nodes, including 11 mm short axis left common iliac node (series 3/image 20). Small pelvic nodes bilaterally, including a 14 mm short axis left external iliac node (series 3/image 87), these are mildly prominent although likely reactive. Reproductive:  Prostate is unremarkable. Other:  No pelvic ascites. Musculoskeletal: Inflammatory stranding/phlegmonous change in the medial left buttock adjacent to the gluteal cleft (series 11/image 97). No discrete fluid collection/abscess. Mild degenerative changes of the lower lumbar spine. IMPRESSION: Inflammatory stranding/phlegmonous change in the medial left buttock adjacent to the gluteal cleft, reflecting cellulitis. No discrete fluid collection/abscess. Small retroperitoneal/pelvic lymph nodes, likely reactive. Electronically Signed   By: Charline Bills M.D.   On: 02/21/2019 05:17    Pending Labs Unresulted Labs (From admission, onward)    Start     Ordered   02/21/19 0400  Hemoglobin A1c  ONCE - STAT,   STAT     02/21/19 0400   Signed and Held  Creatinine, serum  (enoxaparin (LOVENOX)    CrCl >/= 30 ml/min)  Weekly,   R    Comments:  while on enoxaparin therapy    Signed and Held   Signed and Held  TSH  Add-on,   R     Signed and Held          Vitals/Pain Today's Vitals   02/20/19 2334 02/20/19 2335 02/21/19 0252  BP: (!) 180/74  (!) 158/97  Pulse: 92  85  Resp: 19  20  Temp: 98.9 F (37.2 C)  98.5 F (36.9 C)  TempSrc: Oral  Oral  SpO2: 98%  98%  Weight:  (!) 210.7 kg   Height:   (1.702 m)   PainSc:  4  2     Isolation Precautions No active isolations  Medications Medications  vancomycin (VANCOCIN) 2,500 mg in sodium chloride 0.9 % 500 mL IVPB (has no administration in time range)  piperacillin-tazobactam (ZOSYN) 4.5 g in dextrose 5  % 50 mL IVPB (4.5 g Intravenous New Bag/Given 02/21/19 0651)  lidocaine-EPINEPHrine (XYLOCAINE W/EPI) 2 %-1:100000 (with pres) injection 30 mL (has no administration in time range)  vancomycin (VANCOCIN) 2,500 mg in sodium chloride 0.9 % 500 mL IVPB (has no administration in time range)  clindamycin (CLEOCIN) IVPB 600 mg (0 mg Intravenous Stopped 02/21/19 0629)  iohexol (OMNIPAQUE) 300 MG/ML solution 125 mL (150 mLs Intravenous Contrast Given 02/21/19 0447)  morphine 4 MG/ML injection 4 mg (4 mg Intravenous Given 02/21/19 0612)    Mobility walks Low fall risk   Focused Assessments    R Recommendations: See Admitting Provider Note  Report given to:   Additional Notes

## 2019-02-21 NOTE — Progress Notes (Addendum)
Pharmacy Antibiotic Note  Christopher Keith is a 33 y.o. male admitted on 02/21/2019 with wound infection.  Pharmacy has been consulted for Vancomycin, Zosyn dosing.  Pt is morbidly obese (BMI 72.7).  Plan:  Zosyn 4.5 gm IV Q8H EI ordered to start on 5/17 @ 0700.  Vancomycin 2500 mg IV X 1 to be given in ED on 5/17 @ ~ 0700. Vancomycin 2500 mg IV Q12H ordered to start on 5/17 @ 1900. No peak or trough currently ordered.   CrCl = 120 ml/min Ke = 0.1 hr-1 T1/2 = 6.6 hrs Vd = 105.4 L   AUC = 454.6 Vanc trough = 12.3 mcg/mL   Height: 5\' 7"  (170.2 cm) Weight: (!) 464 lb 9 oz (210.7 kg) IBW/kg (Calculated) : 66.1  Temp (24hrs), Avg:98.7 F (37.1 C), Min:98.5 F (36.9 C), Max:98.9 F (37.2 C)  Recent Labs  Lab 02/20/19 2346 02/21/19 0435  WBC 15.4*  --   CREATININE 0.57*  --   LATICACIDVEN  --  1.2    Estimated Creatinine Clearance: 225.9 mL/min (A) (by C-G formula based on SCr of 0.57 mg/dL (L)).    No Known Allergies  Antimicrobials this admission:   >>   >>   Dose adjustments this admission:   Microbiology results:  BCx:   UCx:    Sputum:    MRSA PCR:   Thank you for allowing pharmacy to be a part of this patient's care.  Pope Brunty D 02/21/2019 6:55 AM

## 2019-02-21 NOTE — ED Notes (Signed)
Pt reports noticed a bump under left buttocks about a week ago, reports he squeezed bump and had some discharge with blood, reports area has become more welling and tender to touch, reports redness and tender to touch, Upon assessment pt has abscess area in the inner part of left buttocks, red, tender to touch, pt talks in complete sentences no respiratory distress noted

## 2019-02-21 NOTE — H&P (Signed)
Christopher EmeryJuan Keith is an 33 y.o. male.   Chief Complaint: Abscess HPI: The patient with past medical history of hypertension and morbid obesity presents to the emergency department complaining of a painful area on his buttock.  The patient reports a small tender area that developed a few days ago that he is tried to pop without much success.  In that time the tender lesion has grown in size and area.  The patient denies fever, nausea, vomiting or diarrhea.  CT of the pelvis showed inflammatory area with associated phlegmon but no discrete abscess.  The surgical service came to bedside to perform incision and drainage which the patient tolerated well.  Due to the extent of debridement and size of cellulitic area the emergency department staff called the hospitalist service for admission.  Past Medical History:  Diagnosis Date  . Hypertension   . Obesity     Past Surgical History:  Procedure Laterality Date  . ADENOIDECTOMY    . TONSILLECTOMY      Family History  Problem Relation Age of Onset  . Gastric cancer Mother   . Alcohol abuse Father   . Obesity Father   . Diabetes Mellitus II Brother    Social History:  reports that he has been smoking cigarettes. He has never used smokeless tobacco. He reports current alcohol use. He reports that he does not use drugs.  Allergies: No Known Allergies  Prior to Admission medications   Not on File     Results for orders placed or performed during the hospital encounter of 02/21/19 (from the past 48 hour(s))  CBC with Differential     Status: Abnormal   Collection Time: 02/20/19 11:46 PM  Result Value Ref Range   WBC 15.4 (H) 4.0 - 10.5 K/uL   RBC 5.08 4.22 - 5.81 MIL/uL   Hemoglobin 14.2 13.0 - 17.0 g/dL   HCT 16.143.4 09.639.0 - 04.552.0 %   MCV 85.4 80.0 - 100.0 fL   MCH 28.0 26.0 - 34.0 pg   MCHC 32.7 30.0 - 36.0 g/dL   RDW 40.913.5 81.111.5 - 91.415.5 %   Platelets 332 150 - 400 K/uL   nRBC 0.0 0.0 - 0.2 %   Neutrophils Relative % 70 %   Neutro Abs 10.9 (H)  1.7 - 7.7 K/uL   Lymphocytes Relative 17 %   Lymphs Abs 2.6 0.7 - 4.0 K/uL   Monocytes Relative 9 %   Monocytes Absolute 1.4 (H) 0.1 - 1.0 K/uL   Eosinophils Relative 2 %   Eosinophils Absolute 0.3 0.0 - 0.5 K/uL   Basophils Relative 1 %   Basophils Absolute 0.1 0.0 - 0.1 K/uL   Immature Granulocytes 1 %   Abs Immature Granulocytes 0.07 0.00 - 0.07 K/uL    Comment: Performed at [facility], [address] [place], [place] [address]  Basic metabolic panel     Status: Abnormal   Collection Time: 02/20/19 11:46 PM  Result Value Ref Range   Sodium 136 135 - 145 mmol/L   Potassium 3.5 3.5 - 5.1 mmol/L   Chloride 102 98 - 111 mmol/L   CO2 26 22 - 32 mmol/L   Glucose, Bld 144 (H) 70 - 99 mg/dL   BUN 16 6 - 20 mg/dL   Creatinine, Ser 6.210.57 (L) 0.61 - 1.24 mg/dL   Calcium 8.5 (L) 8.9 - 10.3 mg/dL   GFR calc non Af Amer >60 >60 mL/min   GFR calc Af Amer >60 >60 mL/min   Anion  gap 8 5 - 15    Comment: Performed at [facility], [address] [place], [place] [address]  SARS Coronavirus 2 (CEPHEID - Performed in [facility] hospital lab), Hosp Order     Status: None   Collection Time: 02/21/19  4:34 AM  Result Value Ref Range   SARS Coronavirus 2 NEGATIVE NEGATIVE    Comment: (NOTE) If result is NEGATIVE SARS-CoV-2 target nucleic acids are NOT DETECTED. The SARS-CoV-2 RNA is generally detectable in upper and lower  respiratory specimens during the acute phase of infection. The lowest  concentration of SARS-CoV-2 viral copies this assay can detect is 250  copies / mL. A negative result does not preclude SARS-CoV-2 infection  and should not be used as the sole basis for treatment or other  patient management decisions.  A negative result may occur with  improper specimen collection / handling, submission of specimen other  than nasopharyngeal swab, presence of viral mutation(s) within the  areas targeted by this assay, and inadequate number of viral copies  (<250  copies / mL). A negative result must be combined with clinical  observations, patient history, and epidemiological information. If result is POSITIVE SARS-CoV-2 target nucleic acids are DETECTED. The SARS-CoV-2 RNA is generally detectable in upper and lower  respiratory specimens dur ing the acute phase of infection.  Positive  results are indicative of active infection with SARS-CoV-2.  Clinical  correlation with patient history and other diagnostic information is  necessary to determine patient infection status.  Positive results do  not rule out bacterial infection or co-infection with other viruses. If result is PRESUMPTIVE POSTIVE SARS-CoV-2 nucleic acids MAY BE PRESENT.   A presumptive positive result was obtained on the submitted specimen  and confirmed on repeat testing.  While 2019 novel coronavirus  (SARS-CoV-2) nucleic acids may be present in the submitted sample  additional confirmatory testing may be necessary for epidemiological  and / or clinical management purposes  to differentiate between  SARS-CoV-2 and other Sarbecovirus currently known to infect humans.  If clinically indicated additional testing with an alternate test  methodology [phone]) is advised. The SARS-CoV-2 RNA is generally  detectable in upper and lower respiratory sp ecimens during the acute  phase of infection. The expected result is Negative. Fact Sheet for Patients:  BoilerBrush.com.cy Fact Sheet for Healthcare Providers: https://pope.com/ This test is not yet approved or cleared by the [place] FDA and has been authorized for detection and/or diagnosis of SARS-CoV-2 by FDA under an Emergency Use Authorization (EUA).  This EUA will remain in effect (meaning this test can be used) for the duration of the COVID-19 declaration under Section 564(b)(1) of the Act, 21 U.S.C. section 360bbb-3(b)(1), unless the authorization is terminated or revoked  sooner. Performed at [facility], [address] [place], [place] [address]   Blood culture (routine x 2)     Status: None (Preliminary result)   Collection Time: 02/21/19  4:35 AM  Result Value Ref Range   Specimen Description BLOOD RIGHT ANTECUBITAL    Special Requests      BOTTLES DRAWN AEROBIC AND ANAEROBIC Blood Culture adequate volume   Culture      NO GROWTH <12 HOURS Performed at [facility], [address] [place], [place] [address]    Report Status PENDING   Blood culture (routine x 2)     Status: None (Preliminary result)   Collection Time: 02/21/19  4:35 AM  Result Value Ref Range   Specimen  Description BLOOD LEFT ANTECUBITAL    Special Requests      BOTTLES DRAWN AEROBIC AND ANAEROBIC Blood Culture results may not be optimal due to an excessive volume of blood received in culture bottles   Culture      NO GROWTH <12 HOURS Performed at [facility], [address], [place], [place] [address]    Report Status PENDING   Lactic acid, plasma     Status: None   Collection Time: 02/21/19  4:35 AM  Result Value Ref Range   Lactic Acid, Venous 1.2 0.5 - 1.9 mmol/L    Comment: Performed at [facility], [address] [place], [place] [address]   Ct Pelvis W Contrast  Result Date: 02/21/2019 CLINICAL DATA:  Left buttock abscess EXAM: CT PELVIS WITH CONTRAST TECHNIQUE: Multidetector CT imaging of the pelvis was performed using the standard protocol following the bolus administration of intravenous contrast. CONTRAST:  OMNIPAQUE IOHEXOL 300 MG/ML  SOLN COMPARISON:  None. FINDINGS: Urinary Tract:  Bladder is within normal limits. Bowel:  Visualized bowel is unremarkable. Vascular/Lymphatic: No evidence of aneurysm. Small retroperitoneal nodes, including 11 mm short axis left common iliac node (series 3/image 20). Small pelvic nodes bilaterally, including a 14 mm short axis left external iliac node (series 3/image 87), these are  mildly prominent although likely reactive. Reproductive:  Prostate is unremarkable. Other:  No pelvic ascites. Musculoskeletal: Inflammatory stranding/phlegmonous change in the medial left buttock adjacent to the gluteal cleft (series 11/image 97). No discrete fluid collection/abscess. Mild degenerative changes of the lower lumbar spine. IMPRESSION: Inflammatory stranding/phlegmonous change in the medial left buttock adjacent to the gluteal cleft, reflecting cellulitis. No discrete fluid collection/abscess. Small retroperitoneal/pelvic lymph nodes, likely reactive. Electronically Signed   By: Charline Bills M.D.   On: 02/21/2019 05:17    Review of Systems  Constitutional: Negative for chills and fever.  HENT: Negative for sore throat and tinnitus.   Eyes: Negative for blurred vision and redness.  Respiratory: Negative for cough and shortness of breath.   Cardiovascular: Negative for chest pain, palpitations, orthopnea and PND.  Gastrointestinal: Negative for abdominal pain, diarrhea, nausea and vomiting.  Genitourinary: Negative for dysuria, frequency and urgency.  Musculoskeletal: Negative for joint pain and myalgias.  Skin: Negative for rash.       No lesions  Neurological: Negative for speech change, focal weakness and weakness.  Endo/Heme/Allergies: Does not bruise/bleed easily.       No temperature intolerance  Psychiatric/Behavioral: Negative for depression and suicidal ideas.    Blood pressure (!) 158/97, pulse 85, temperature 98.5 F (36.9 C), temperature source Oral, resp. rate 20, height  (1.702 m), weight (!) 210.7 kg, SpO2 98 %. Physical Exam  Vitals reviewed. Constitutional: He is oriented to person, place, and time. He appears well-developed and well-nourished. No distress.  HENT:  Head: Normocephalic and atraumatic.  Mouth/Throat: Oropharynx is clear and moist.  Eyes: Pupils are equal, round, and reactive to light. Conjunctivae and EOM are normal. No scleral  icterus.  Neck: Normal range of motion. Neck supple. No JVD present. No tracheal deviation present. No thyromegaly present.  Cardiovascular: Normal rate, regular rhythm and normal heart sounds. Exam reveals no gallop and no friction rub.  No murmur heard. Respiratory: Effort normal and breath sounds normal. No respiratory distress.  GI: Soft. Bowel sounds are normal. He exhibits no distension. There is no abdominal tenderness.  Genitourinary:    Genitourinary Comments: Deferred   Musculoskeletal: Normal range of motion.  General: No edema.  Lymphadenopathy:    He has no cervical adenopathy.  Neurological: He is alert and oriented to person, place, and time. No cranial nerve deficit.  Skin: Skin is warm and dry. No rash noted. There is erythema.  Psychiatric: He has a normal mood and affect. His behavior is normal. Judgment and thought content normal.     Assessment/Plan This is a 33 year old male admitted for cellulitis. 1.  Cellulitis: Left buttock.  The patient does not meet criteria for sepsis at this time.  He is received vancomycin, Zosyn and clindamycin in the emergency department we will narrow coverage slightly until MRSA status is determined. 2.  Gluteal abscess: More accurately a phlegmon.  Status post I&D in the emergency department.  Manage severe pain with IV morphine as needed. 3.  Hypertension: Uncontrolled; labetalol as needed.  I will start the patient on amlodipine. 4.  Morbid obesity: BMI is 72.7; encouraged healthy diet and exercise 5.  DVT prophylaxis: Lovenox 6.  GI prophylaxis: None Patient is a full code.  Time spent on admission orders and patient care approximately 45 minutes  Arnaldo Natal, MD 02/21/2019, 7:17 AM

## 2019-02-22 ENCOUNTER — Inpatient Hospital Stay (HOSPITAL_COMMUNITY)
Admission: AD | Admit: 2019-02-22 | Discharge: 2019-02-24 | DRG: 602 | Disposition: A | Discharge status: Home / Self Care | Payer: Self-pay | Source: Other Acute Inpatient Hospital | Attending: Internal Medicine | Admitting: Internal Medicine

## 2019-02-22 DIAGNOSIS — L03317 Cellulitis of buttock: Principal | ICD-10-CM

## 2019-02-22 DIAGNOSIS — Z20828 Contact with and (suspected) exposure to other viral communicable diseases: Secondary | ICD-10-CM | POA: Diagnosis present

## 2019-02-22 DIAGNOSIS — L8990 Pressure ulcer of unspecified site, unspecified stage: Secondary | ICD-10-CM

## 2019-02-22 DIAGNOSIS — E871 Hypo-osmolality and hyponatremia: Secondary | ICD-10-CM | POA: Diagnosis not present

## 2019-02-22 DIAGNOSIS — Z6841 Body Mass Index (BMI) 40.0 and over, adult: Secondary | ICD-10-CM

## 2019-02-22 DIAGNOSIS — L89154 Pressure ulcer of sacral region, stage 4: Secondary | ICD-10-CM | POA: Diagnosis present

## 2019-02-22 DIAGNOSIS — I10 Essential (primary) hypertension: Secondary | ICD-10-CM | POA: Diagnosis present

## 2019-02-22 DIAGNOSIS — F17210 Nicotine dependence, cigarettes, uncomplicated: Secondary | ICD-10-CM | POA: Diagnosis present

## 2019-02-22 DIAGNOSIS — L0231 Cutaneous abscess of buttock: Secondary | ICD-10-CM | POA: Diagnosis present

## 2019-02-22 DIAGNOSIS — Z22322 Carrier or suspected carrier of Methicillin resistant Staphylococcus aureus: Secondary | ICD-10-CM

## 2019-02-22 DIAGNOSIS — Z79899 Other long term (current) drug therapy: Secondary | ICD-10-CM

## 2019-02-22 HISTORY — DX: Carrier or suspected carrier of methicillin resistant Staphylococcus aureus: Z22.322

## 2019-02-22 LAB — CBC
HCT: 41.3 % (ref 39.0–52.0)
Hemoglobin: 13.4 g/dL (ref 13.0–17.0)
MCH: 28.2 pg (ref 26.0–34.0)
MCHC: 32.4 g/dL (ref 30.0–36.0)
MCV: 86.8 fL (ref 80.0–100.0)
Platelets: 305 10*3/uL (ref 150–400)
RBC: 4.76 MIL/uL (ref 4.22–5.81)
RDW: 13.3 % (ref 11.5–15.5)
WBC: 10.6 10*3/uL — ABNORMAL HIGH (ref 4.0–10.5)
nRBC: 0 % (ref 0.0–0.2)

## 2019-02-22 LAB — CREATININE, SERUM
Creatinine, Ser: 0.55 mg/dL — ABNORMAL LOW (ref 0.61–1.24)
GFR calc Af Amer: >60 mL/min (ref >60)
GFR calc non Af Amer: >60 mL/min (ref >60)

## 2019-02-22 MED ORDER — DAKINS (1/4 STRENGTH) 0.125 % EX SOLN
Freq: Every day | CUTANEOUS | Status: DC
  Administered 2019-02-22: 15:00:00
  Filled 2019-02-22 (×2): qty 473

## 2019-02-22 MED ORDER — PIPERACILLIN-TAZOBACTAM 3.375 G IVPB
3.3750 g | Freq: Three times a day (TID) | INTRAVENOUS | Status: DC
  Administered 2019-02-22 – 2019-02-24 (×5): 3.375 g via INTRAVENOUS
  Filled 2019-02-22 (×4): qty 50

## 2019-02-22 MED ORDER — ONDANSETRON HCL 4 MG PO TABS: 4.0000 mg | ORAL_TABLET | Freq: Four times a day (QID) | ORAL | Status: DC | PRN

## 2019-02-22 MED ORDER — ONDANSETRON HCL 4 MG/2ML IJ SOLN: 4.0000 mg | Freq: Four times a day (QID) | INTRAMUSCULAR | Status: DC | PRN

## 2019-02-22 MED ORDER — ACETAMINOPHEN 650 MG RE SUPP: 650.0000 mg | Freq: Four times a day (QID) | RECTAL | Status: DC | PRN

## 2019-02-22 MED ORDER — ACETAMINOPHEN 325 MG PO TABS: 650.0000 mg | ORAL_TABLET | Freq: Four times a day (QID) | ORAL | Status: DC | PRN

## 2019-02-22 MED ORDER — DAKINS (1/4 STRENGTH) 0.125 % EX SOLN
1.0000 "application " | Freq: Two times a day (BID) | CUTANEOUS | Status: DC
  Administered 2019-02-23: 1
  Filled 2019-02-22: qty 473

## 2019-02-22 MED ORDER — HYDRALAZINE HCL 20 MG/ML IJ SOLN: 10.0000 mg | Freq: Four times a day (QID) | INTRAMUSCULAR | Status: DC | PRN

## 2019-02-22 MED ORDER — DAKINS (1/4 STRENGTH) 0.125 % EX SOLN: Freq: Every day | CUTANEOUS | 0 refills | Status: DC

## 2019-02-22 MED ORDER — ONDANSETRON HCL 4 MG PO TABS: 4.0000 mg | ORAL_TABLET | Freq: Four times a day (QID) | ORAL | 0 refills | Status: DC | PRN

## 2019-02-22 MED ORDER — AMLODIPINE BESYLATE 5 MG PO TABS: 5.0000 mg | ORAL_TABLET | Freq: Every day | ORAL | Status: DC

## 2019-02-22 MED ORDER — PIPERACILLIN-TAZOBACTAM 3.375 G IVPB: 3.3750 g | Freq: Three times a day (TID) | INTRAVENOUS | Status: DC

## 2019-02-22 MED ORDER — NICOTINE 14 MG/24HR TD PT24: 14.0000 mg | MEDICATED_PATCH | Freq: Every day | TRANSDERMAL | 0 refills | Status: DC

## 2019-02-22 MED ORDER — DOCUSATE SODIUM 100 MG PO CAPS: 100.0000 mg | ORAL_CAPSULE | Freq: Two times a day (BID) | ORAL | 0 refills | Status: DC

## 2019-02-22 MED ORDER — ENOXAPARIN SODIUM 40 MG/0.4ML ~~LOC~~ SOLN: 40.0000 mg | Freq: Two times a day (BID) | SUBCUTANEOUS | Status: DC

## 2019-02-22 MED ORDER — OXYCODONE HCL 5 MG PO TABS
5.0000 mg | ORAL_TABLET | ORAL | Status: DC | PRN
  Administered 2019-02-23: 5 mg via ORAL
  Filled 2019-02-22: qty 1

## 2019-02-22 MED ORDER — OXYCODONE HCL 5 MG PO TABS: 5.0000 mg | ORAL_TABLET | Freq: Four times a day (QID) | ORAL | 0 refills | Status: DC | PRN

## 2019-02-22 NOTE — Progress Notes (Signed)
[facility] [place] at [facility]   PATIENT NAME: Christopher EmeryJuan Keith    MR#:  [id number]  DATE OF BIRTH:  Apr 21, 1986  SUBJECTIVE:  CHIEF COMPLAINT: Patient is feeling the same scheduled for bedside I&D by surgery.  No fever or chills.  Status post bedside debridement on 02/21/2019  REVIEW OF SYSTEMS:  CONSTITUTIONAL: No fever, fatigue or weakness.  EYES: No blurred or double vision.  EARS, NOSE, AND THROAT: No tinnitus or ear pain.  RESPIRATORY: No cough, shortness of breath, wheezing or hemoptysis.  CARDIOVASCULAR: No chest pain, orthopnea, edema.  GASTROINTESTINAL: No nausea, vomiting, diarrhea or abdominal pain.  GENITOURINARY: No dysuria, hematuria.  ENDOCRINE: No polyuria, nocturia,  HEMATOLOGY: No anemia, easy bruising or bleeding SKIN: No rash or lesion.  Left buttock abscess with redness and pain MUSCULOSKELETAL: No joint pain or arthritis.   NEUROLOGIC: No tingling, numbness, weakness.  PSYCHIATRY: No anxiety or depression.   DRUG ALLERGIES:  No Known Allergies  VITALS:  Blood pressure (!) 139/54, pulse 69, temperature 98.3 F (36.8 C), resp. rate 20, height 5\' 7"  (1.702 m), weight (!) 209.8 kg, SpO2 94 %.  PHYSICAL EXAMINATION:  GENERAL:  33 y.o.-year-old patient lying in the bed with no acute distress.  EYES: Pupils equal, round, reactive to light and accommodation. No scleral icterus. Extraocular muscles intact.  HEENT: Head atraumatic, normocephalic. Oropharynx and nasopharynx clear.  NECK:  Supple, no jugular venous distention. No thyroid enlargement, no tenderness.  LUNGS: Normal breath sounds bilaterally, no wheezing, rales,rhonchi or crepitation. No use of accessory muscles of respiration.  CARDIOVASCULAR: S1, S2 normal. No murmurs, rubs, or gallops.  ABDOMEN: Soft, nontender, nondistended. Bowel sounds present.  EXTREMITIES: No pedal edema, cyanosis, or clubbing.  NEUROLOGIC: Awake, alert and oriented x3 sensation intact. Gait not checked.   PSYCHIATRIC: The patient is alert and oriented x 3.  SKIN: Left gluteal abscess, tender, edematous   LABORATORY PANEL:   CBC Recent Labs  Lab 02/22/19 0510  WBC 10.6*  HGB 13.4  HCT 41.3  PLT 305   ------------------------------------------------------------------------------------------------------------------  Chemistries  Recent Labs  Lab 02/20/19 2346 02/22/19 0510  NA 136  --   K 3.5  --   CL 102  --   CO2 26  --   GLUCOSE 144*  --   BUN 16  --   CREATININE 0.57* 0.55*  CALCIUM 8.5*  --    ------------------------------------------------------------------------------------------------------------------  Cardiac Enzymes No results for input(s): TROPONINI in the last 168 hours. ------------------------------------------------------------------------------------------------------------------  RADIOLOGY:  Ct Pelvis W Contrast  Result Date: 02/21/2019 CLINICAL DATA:  Left buttock abscess EXAM: CT PELVIS WITH CONTRAST TECHNIQUE: Multidetector CT imaging of the pelvis was performed using the standard protocol following the bolus administration of intravenous contrast. CONTRAST:  150mL OMNIPAQUE IOHEXOL 300 MG/ML  SOLN COMPARISON:  None. FINDINGS: Urinary Tract:  Bladder is within normal limits. Bowel:  Visualized bowel is unremarkable. Vascular/Lymphatic: No evidence of aneurysm. Small retroperitoneal nodes, including 11 mm short axis left common iliac node (series 3/image 20). Small pelvic nodes bilaterally, including a 14 mm short axis left external iliac node (series 3/image 87), these are mildly prominent although likely reactive. Reproductive:  Prostate is unremarkable. Other:  No pelvic ascites. Musculoskeletal: Inflammatory stranding/phlegmonous change in the medial left buttock adjacent to the gluteal cleft (series 11/image 97). No discrete fluid collection/abscess. Mild degenerative changes of the lower lumbar spine. IMPRESSION: Inflammatory stranding/phlegmonous  change in the medial left buttock adjacent to the gluteal cleft, reflecting cellulitis. No discrete fluid collection/abscess.  Small retroperitoneal/pelvic lymph nodes, likely reactive. Electronically Signed   By: Charline Bills M.D.   On: 02/21/2019 05:17    EKG:  No orders found for this or any previous visit.  ASSESSMENT AND PLAN:   This is a 33 year old male admitted for cellulitis.  1.  Cellulitis: Left buttock.   The patient does not meet criteria for sepsis at this time.   Continue IV vancomycin, Zosyn  we will narrow coverage slightly until MRSA status is determined. 2.  Gluteal abscess: Status post I&D in the emergency department.  Manage severe pain with IV morphine as needed. IV antibiotics Zosyn and vancomycin Surgery Dr. Maia Plan wants to do additional debridement but anesthesia are stating patient is at high risk.  Wound care nurse is recommending hydrotherapy and bariatric care probably at [facility] long.  We will transfer the patient to [facility] hospital with bariatric care and hydrotherapy facilities once  bed is available .  patient is agreeable -3.  Hypertension: Pain from the cellulitis and abscess is playing a major role but relatively better Patient started on amlodipine titrate as needed IV labetalol as needed  4.  Morbid obesity: BMI is 72.7; encouraged healthy diet and exercise 5.  DVT prophylaxis: Lovenox 6.  GI prophylaxis: None     All the records are reviewed and case discussed with Care Management/Social Workerr. Management plans discussed with the patient, he is in agreement.  CODE STATUS: fc  TOTAL TIME TAKING CARE OF THIS PATIENT: 36 minutes.   POSSIBLE D/C IN 2 DAYS, DEPENDING ON CLINICAL CONDITION.  Note: This dictation was prepared with Dragon dictation along with smaller phrase technology. Any transcriptional errors that result from this process are unintentional.   Ramonita Lab M.D on 02/22/2019 at 1:21 PM  Between 7am to 6pm - Pager -  [phone] After 6pm go to [email or web address] - password [email or web address]  [facility]  Office  [phone]  CC: Primary care physician; Patient, No Pcp Per

## 2019-02-22 NOTE — Consult Note (Signed)
WOC Nurse wound consult note Reason for Consult: Patient seen at [facility] earlier today by my partner, K. Sanders. Transferred to [facility] for surgical consult, debridement and hydrotherapy if [facility] consultant agrees. Wound type:Infectious vs pressure Pressure Injury POA: Yes Measurement:Per note from K. Sanders earlier today, 3cm x 2cm with depth unable to be determined due to the presence of necrotic tissue. Wound bed:per note from K. Sanders earlier today, 100% necrotic tissue Drainage (amount, consistency, odor) Per note from K. Sanders earlier today, moderate amount serosanguinous exudate Periwound:Erythema and maroon discoloration of surrounding tissue Dressing procedure/placement/frequency: I have provided the patient with a bariatric mattress replacement with low air loss feature and reordered the care orders of my partner, K. Sanders ie. Dakin's solution dampened dressings to wound twice daily for 3 days.    Recommend [facility] consultation. If you agree, please order/arrange.  WOC nursing team will not follow, but will remain available to this patient, the nursing and medical teams.  Please re-consult if needed. Thanks, Ladona Mow, MSN, RN, GNP, [place]  Pager# [phone]

## 2019-02-22 NOTE — Progress Notes (Addendum)
SURGICAL PROGRESS NOTE   Hospital Day(s): 0.   Post op day(s):  Marland Kitchen   Interval History: Patient seen and examined, no acute events or new complaints overnight. Patient reports no major improvement of his initial symptoms.  He continued having pain on the left gluteal area.  Denies fever chills.  Patient had bedside cleansing and debridement yesterday and has been on antibiotics since then.  Vital signs in last 24 hours: [min-max] current  Temp:  [98.2 F (36.8 C)-98.3 F (36.8 C)] 98.3 F (36.8 C) (05/18 0422) Pulse Rate:  [69-76] 69 (05/18 0422) Resp:  [18-20] 20 (05/18 0422) BP: (139-152)/(54-74) 139/54 (05/18 0422) SpO2:  [94 %-100 %] 94 % (05/18 0422) Weight:  [209.8 kg] 209.8 kg (05/18 0422)     Height: 5\' 7"  (170.2 cm) Weight: (!) 209.8 kg BMI (Calculated): 72.42   Physical Exam:  Constitutional: alert, cooperative and no distress  Skin: Patient with a left buttock abscess, no necrotic tissue but abundant amount of purulent secretion.  There is persistent cellulitis.  There is no crepitus.  Labs:  CBC Latest Ref Rng & Units 02/22/2019 02/20/2019 10/21/2013  WBC 4.0 - 10.5 K/uL 10.6(H) 15.4(H) 10.5  Hemoglobin 13.0 - 17.0 g/dL 71.2 19.7 58.8  Hematocrit 39.0 - 52.0 % 41.3 43.4 43.6  Platelets 150 - 400 K/uL 305 332 299   CMP Latest Ref Rng & Units 02/22/2019 02/20/2019 10/21/2013  Glucose 70 - 99 mg/dL - 325(Q) 982(M)  BUN 6 - 20 mg/dL - 16 14  Creatinine 4.15 - 1.24 mg/dL 8.30(N) 4.07(W) 8.08(U)  Sodium 135 - 145 mmol/L - 136 136  Potassium 3.5 - 5.1 mmol/L - 3.5 3.9  Chloride 98 - 111 mmol/L - 102 104  CO2 22 - 32 mmol/L - 26 25  Calcium 8.9 - 10.3 mg/dL - 8.5(L) 8.6    Imaging studies: No new pertinent imaging studies   Assessment/Plan:  33 y.o. male with left gluteal abscess, complicated by pertinent comorbidities including morbid obesity of 72 of BMI.  Patient with worsening left gluteal abscess.  An admission I discussed with anesthesia to perform incision,  drainage and debridement of the left buttock area but it was assessed as high risk for anesthesia.  I perform bedside incision and cleansing and debridement for initial infection control.  Patient was admitted for IV antibiotic therapy that has not improved significantly with abundant purulent drainage.  Even though there is no recurrent necrotic tissue, there is abundant purulent drainage.  My recommendation was to take patient to the OR for additional clean seen and debridement.  I discussed the case with 2 anesthesiologist today and both assess that this is high risk patient for our institution.  They recommend transferring patient to a center with bariatric services.  I will have to agree with the recommendation since I considered that this patient needs further cleansing and debridement.  I would like to avoid bedside debridement since it was painful for the patient despite local anesthesia and he definitely needs a deeper clean skin.  At this moment there is no crepitance on physical exam to suggest severe deep necrotizing fasciitis that needs emergent surgery.  Gae Gallop, MD  Addendum:   As per Wound care recommendations to transfer patient to a [facility] facility with Hydrotherapy and bariatric services, case was presented to Surgery services at [facility] and they will be available to consult on the patient if Hydrotherapy does not work and patient needs further surgical debridement. Hospitalist will coordinate transfer. Case discussed  with Dr. Amado CoeGouru.

## 2019-02-22 NOTE — Progress Notes (Signed)
Pt arrived to unit via [facility]. Pt alert and oriented. Wound on bottom draining so area was redressed per WOC orders. Call bell within reach. MD paged and made aware that pt has arrived to floor.

## 2019-02-22 NOTE — H&P (Signed)
History and Physical    [name] CEY:[id number] DOB: 1985-12-18 DOA: 02/22/2019  PCP: Patient, No Pcp Per  Patient coming from: [facility]  I have personally briefly reviewed patient's old medical records in [facility]  Chief Complaint: Skin infection left buttocks  HPI: Christopher Keith is a 33 y.o. male with medical history significant for hypertension and morbid obesity who presented to [facility] with a wound on his left buttocks.  He first noticed this about a week ago.  He felt the size of the wound increasing and was scratching at the area and eventually popped open on his own using his fingers.  He thought he had seen pus oozing from the area as well as significant skin breakdown.  He had noticed worsening pain and irritation to the area when applying direct pressure from friction on his pants.  He decided to present to [facility] ED for further evaluation and management.  He denies any associated fevers, chills, diaphoresis, chest pain, dyspnea, nausea, vomiting, abdominal pain, dysuria, diarrhea, or other skin wounds or rash.  [facility] Course:  Initial vitals showed BP 158/97, pulse 85, RR 20, temp 98.5 Fahrenheit, SPO2 98% on room air.  His initial labs were notable for WBC 15.4, hemoglobin 14.2, platelets 332,000, BUN 16, creatinine 0.57, serum glucose 144.  TSH was 2.072.  A1c was 5.4%.  SARS-CoV-2 test was negative.  Lactic acid was 1.2.  Blood cultures were drawn and no growth to date.  CT pelvis with contrast showed inflammatory stranding/phlegmonous change in the medial left buttock adjacent to the gluteal cleft, reflecting cellulitis.  No discrete fluid collection/abscess seen on CT imaging.  General surgery were consulted and performed a bedside excisional debridement with intraoperative findings of a 6 cm x 4 cm and 1 cm deep stage IV sacral ulcer without purulent secretions per procedure note.  Patient was admitted for IV antibiotics initially started on vancomycin, Zosyn, and clindamycin.   General surgery recommended taking the patient to the OR for additional incision and debridement, however due to anesthesiology concerns were patient's morbid obesity was recommended he transfer to [facility] for possible hydrotherapy and bariatric services.  [facility] surgeon Dr. Hazle Quant discussed the case with on-call surgery at [facility], Dr. Dwain Sarna.  Review of Systems: All systems reviewed and are negative except as documented in history of present illness above.   Past Medical History:  Diagnosis Date  . Hypertension   . Obesity     Past Surgical History:  Procedure Laterality Date  . ADENOIDECTOMY    . TONSILLECTOMY      Social History:  reports that he has been smoking cigarettes. He has never used smokeless tobacco. He reports current alcohol use. He reports that he does not use drugs.  No Known Allergies  Family History  Problem Relation Age of Onset  . Gastric cancer Mother   . Alcohol abuse Father   . Obesity Father   . Diabetes Mellitus II Brother      Prior to Admission medications   Medication Sig Start Date End Date Taking? Authorizing Provider  acetaminophen (TYLENOL) 325 MG tablet Take 2 tablets (650 mg total) by mouth every 6 (six) hours as needed for mild pain (or Fever >/= 101). 02/22/19   Gouru, Deanna Artis, MD  docusate sodium (COLACE) 100 MG capsule Take 1 capsule (100 mg total) by mouth 2 (two) times daily. 02/22/19   Gouru, Deanna Artis, MD  enoxaparin (LOVENOX) 40 MG/0.4ML injection Inject 0.4 mLs (40 mg total) into the skin every 12 (  twelve) hours. 02/22/19   Gouru, Deanna ArtisAruna, MD  nicotine (NICODERM CQ - DOSED IN MG/24 HOURS) 14 mg/24hr patch Place 1 patch (14 mg total) onto the skin daily. 02/23/19   Gouru, Deanna ArtisAruna, MD  ondansetron (ZOFRAN) 4 MG tablet Take 1 tablet (4 mg total) by mouth every 6 (six) hours as needed for nausea. 02/22/19   Gouru, Deanna ArtisAruna, MD  oxyCODONE (OXY IR/ROXICODONE) 5 MG immediate release tablet Take 1 tablet (5 mg total) by mouth every 6 (six) hours as  needed for severe pain. 02/22/19   Gouru, Deanna ArtisAruna, MD  piperacillin-tazobactam (ZOSYN) 3.375 GM/50ML IVPB Inject 50 mLs (3.375 g total) into the vein every 8 (eight) hours. 02/22/19   Gouru, Deanna ArtisAruna, MD  sodium hypochlorite (DAKIN'S 1/4 STRENGTH) 0.125 % SOLN Irrigate with as directed daily after lunch. 02/23/19   Ramonita LabGouru, Aruna, MD    Physical Exam: Vitals:   02/22/19 1813  BP: (!) 144/68  Pulse: 70  Resp: 18  Temp: 98.8 F (37.1 C)  TempSrc: Oral  SpO2: 99%    Constitutional: Morbidly obese man resting supine in bed, NAD, calm, comfortable Eyes: PERRL, lids and conjunctivae normal ENMT: Mucous membranes are moist. Posterior pharynx clear of any exudate or lesions.Normal dentition.  Neck: normal, supple, no masses. Respiratory: Distant breath sounds, clear to auscultation bilaterally, no wheezing, no crackles. Normal respiratory effort. No accessory muscle use.  Cardiovascular: Regular rate and rhythm, no murmurs / rubs / gallops. No extremity edema. 2+ pedal pulses.  Abdomen: no tenderness, no masses palpated. No hepatosplenomegaly. Bowel sounds positive.  Musculoskeletal: no clubbing / cyanosis. No joint deformity upper and lower extremities. Good ROM, no contractures. Normal muscle tone.  Skin: Left buttocks cellulitis with slight erythema and indurated area approximately 4 x 4 centimeter semicircular skin incision without active drainage. Neurologic: CN 2-12 grossly intact. Sensation intact. Strength 5/5 in all 4.  Psychiatric: Normal judgment and insight. Alert and oriented x 3. Normal mood.     Labs on Admission: I have personally reviewed following labs and imaging studies  CBC: Recent Labs  Lab 02/20/19 2346 02/22/19 0510  WBC 15.4* 10.6*  NEUTROABS 10.9*  --   HGB 14.2 13.4  HCT 43.4 41.3  MCV 85.4 86.8  PLT 332 305   Basic Metabolic Panel: Recent Labs  Lab 02/20/19 2346 02/22/19 0510  NA 136  --   K 3.5  --   CL 102  --   CO2 26  --   GLUCOSE 144*  --   BUN 16   --   CREATININE 0.57* 0.55*  CALCIUM 8.5*  --    GFR: Estimated Creatinine Clearance: 225.3 mL/min (A) (by C-G formula based on SCr of 0.55 mg/dL (L)). Liver Function Tests: No results for input(s): AST, ALT, ALKPHOS, BILITOT, PROT, ALBUMIN in the last 168 hours. No results for input(s): LIPASE, AMYLASE in the last 168 hours. No results for input(s): AMMONIA in the last 168 hours. Coagulation Profile: No results for input(s): INR, PROTIME in the last 168 hours. Cardiac Enzymes: No results for input(s): CKTOTAL, CKMB, CKMBINDEX, TROPONINI in the last 168 hours. BNP (last 3 results) No results for input(s): PROBNP in the last 8760 hours. HbA1C: Recent Labs    02/21/19 0435  HGBA1C 5.4   CBG: No results for input(s): GLUCAP in the last 168 hours. Lipid Profile: No results for input(s): CHOL, HDL, LDLCALC, TRIG, CHOLHDL, LDLDIRECT in the last 72 hours. Thyroid Function Tests: Recent Labs    02/20/19 2346  TSH 2.074  Anemia Panel: No results for input(s): VITAMINB12, FOLATE, FERRITIN, TIBC, IRON, RETICCTPCT in the last 72 hours. Urine analysis: No results found for: COLORURINE, APPEARANCEUR, LABSPEC, PHURINE, GLUCOSEU, HGBUR, BILIRUBINUR, KETONESUR, PROTEINUR, UROBILINOGEN, NITRITE, LEUKOCYTESUR  Radiological Exams on Admission: Ct Pelvis W Contrast  Result Date: 02/21/2019 CLINICAL DATA:  Left buttock abscess EXAM: CT PELVIS WITH CONTRAST TECHNIQUE: Multidetector CT imaging of the pelvis was performed using the standard protocol following the bolus administration of intravenous contrast. CONTRAST:  OMNIPAQUE IOHEXOL 300 MG/ML  SOLN COMPARISON:  None. FINDINGS: Urinary Tract:  Bladder is within normal limits. Bowel:  Visualized bowel is unremarkable. Vascular/Lymphatic: No evidence of aneurysm. Small retroperitoneal nodes, including 11 mm short axis left common iliac node (series 3/image 20). Small pelvic nodes bilaterally, including a 14 mm short axis left external iliac  node (series 3/image 87), these are mildly prominent although likely reactive. Reproductive:  Prostate is unremarkable. Other:  No pelvic ascites. Musculoskeletal: Inflammatory stranding/phlegmonous change in the medial left buttock adjacent to the gluteal cleft (series 11/image 97). No discrete fluid collection/abscess. Mild degenerative changes of the lower lumbar spine. IMPRESSION: Inflammatory stranding/phlegmonous change in the medial left buttock adjacent to the gluteal cleft, reflecting cellulitis. No discrete fluid collection/abscess. Small retroperitoneal/pelvic lymph nodes, likely reactive. Electronically Signed   By: Charline Bills M.D.   On: 02/21/2019 05:17    EKG: Not performed.  Assessment/Plan Active Problems:   Cellulitis of left buttock   Hypertension  Mekiah Cambridge is a 33 y.o. male with medical history significant for hypertension and morbid obesity who was transferred to [facility] from [facility] for further management of left buttocks abscess and cellulitis.   Cellulitis and abscess of left buttocks: Status post bedside I&D by general surgery at [facility] on 02/21/2019.  Surgery at [facility] felt further I&D in the OR was needed and patient was transferred to [facility] due to bariatric concerns.  Does not appear initial I&D cultures were sent that I could find. [facility] surgery consulted, team will see in a.m. -Continue IV Zosyn for now -Pain currently well controlled, continue as needed pain management -Will keep n.p.o. at midnight  Hypertension: BP mildly elevated.  Patient states he is supposed to be taking lisinopril-HCTZ as an outpatient but has difficulty affording it. -Start amlodipine 5 mg daily, IV hydralazine if needed   DVT prophylaxis: SCDs  Code Status: Full code Family Communication: None present on admission Disposition Plan: Pending surgical clearance Consults called: [facility], to see in a.m. Admission status: Inpatient   Darreld Mclean MD [facility]  If 7PM-7AM, please contact night-coverage [email or web address]  02/22/2019, 11:21 PM

## 2019-02-22 NOTE — Consult Note (Signed)
WOC Nurse wound consult note Reason for Consult: Unstageable pressure injury to left gluteal area with cellulitis.  Has had I & D at bedside and surgery has recommended further debridement of devitalized tissue.  Due to body habitus and available resources on this campus, patient may benefit from transfer to a [facility] facility that can better serve bariatric surgical services and hydrotherapy for post operative management.   Wound type: unstageable pressure injury to left gluteal fold.  Pressure Injury POA: Yes Measurement: 3 cm x 2 cm unable to determine depth due to the presence of devitalized tissue  Gray and red inflammatory tissue noted.  Wound bed: 100% devitalized tissue Drainage (amount, consistency, odor) moderate serosanguinous  No odor Periwound: Erythema and maroon discoloration Dressing procedure/placement/frequency: Cleanse buttock wound with Ns and pat dry.  Apply Dakins moist gauze to buttocks wound.  Cover with ABD pad and tape.  Change daily x 3 days.  Awaiting transfer and further surgical intervention.  Will not follow at this time.  Please re-consult if needed.  Maple Hudson MSN, RN, FNP-BC [facility] Wound, Ostomy, Continence Nurse Pager [phone]

## 2019-02-22 NOTE — Discharge Summary (Signed)
[facility] - [place] [facility]   PATIENT NAME: Christopher Keith    MR#:  [id number]  DATE OF BIRTH:  [birth year]  DATE OF ADMISSION:  02/21/2019 ADMITTING PHYSICIAN: Arnaldo Natal, MD  DATE OF DISCHARGE: 02/22/2019   PRIMARY CARE PHYSICIAN: Patient, No Pcp Per    ADMISSION DIAGNOSIS:  Cellulitis of left buttock [L03.317] Left buttock abscess [L02.31]  DISCHARGE DIAGNOSIS:  Active Problems:   Cellulitis   Pressure injury of skin   SECONDARY DIAGNOSIS:   Past Medical History:  Diagnosis Date  . Hypertension   . Obesity     HOSPITAL COURSE:   hpi  The patient with past medical history of hypertension and morbid obesity presents to the emergency department complaining of a painful area on his buttock.  The patient reports a small tender area that developed a few days ago that he is tried to pop without much success.  In that time the tender lesion has grown in size and area.  The patient denies fever, nausea, vomiting or diarrhea.  CT of the pelvis showed inflammatory area with associated phlegmon but no discrete abscess.  The surgical service came to bedside to perform incision and drainage which the patient tolerated well.  Due to the extent of debridement and size of cellulitic area the emergency department staff called the hospitalist service for admission.  1. Cellulitis: Left buttock.  The patient does not Christopher criteria for sepsis at this time.  Continue IV vancomycin, Zosyn  we will narrow coverage slightly until MRSA status is determined. 2. Gluteal abscess:Status post I&D in the emergency department. Manage severe pain with IV morphine as needed. IV antibiotics Zosyn, clindamycin and vancomycin started but necrotizing fasciitis ruled out and patient's clindamycin vancomycin discontinued plan is to continue Zosyn as recommended by surgery Dr. Maia Plan Surgery Dr. Maia Plan wants to do additional debridement but anesthesia are stating patient is  at high risk.  Wound care nurse is recommending hydrotherapy and bariatric care probably at [facility] long.  We will transfer the patient to [facility] for bariatric care and hydrotherapy facilities once  bed is available .  patient is agreeable.  Patient is accepted by hospitalist Dr. Hettie Holstein has discussed with the surgeon Dr. Dwain Sarna at [facility] -3. Hypertension: Pain from the cellulitis and abscess and discomfort from the abscess is playing a major role but relatively better Patient started on amlodipine titrate as needed IV labetalol as needed  4. Morbid obesity: BMI is 72.7; encouraged healthy diet and exercise 5. DVT prophylaxis: Lovenox 6. GI prophylaxis: None  DISCHARGE CONDITIONS:     CONSULTS OBTAINED:  Treatment Team:  Carolan Shiver, MD Ramonita Lab, MD Ccs, Md, MD   PROCEDURES debridement of the abscess on the buttock in the ED 02/21/2019  DRUG ALLERGIES:  No Known Allergies  DISCHARGE MEDICATIONS:   Allergies as of 02/22/2019   No Known Allergies     Medication List    TAKE these medications   acetaminophen 325 MG tablet Commonly known as:  TYLENOL Take 2 tablets (650 mg total) by mouth every 6 (six) hours as needed for mild pain (or Fever >/= 101).   docusate sodium 100 MG capsule Commonly known as:  COLACE Take 1 capsule (100 mg total) by mouth 2 (two) times daily.   enoxaparin 40 MG/0.4ML injection Commonly known as:  LOVENOX Inject 0.4 mLs (40 mg total) into the skin every 12 (twelve) hours.   nicotine 14 mg/24hr patch Commonly known as:  NICODERM CQ - dosed in mg/24 hours Place 1 patch (14 mg total) onto the skin daily. Start taking on:  Feb 23, 2019   ondansetron 4 MG tablet Commonly known as:  ZOFRAN Take 1 tablet (4 mg total) by mouth every 6 (six) hours as needed for nausea.   oxyCODONE 5 MG immediate release tablet Commonly known as:  Oxy IR/ROXICODONE Take 1 tablet (5 mg total) by mouth every 6 (six) hours as  needed for severe pain.   piperacillin-tazobactam 3.375 GM/50ML IVPB Commonly known as:  ZOSYN Inject 50 mLs (3.375 g total) into the vein every 8 (eight) hours.   sodium hypochlorite 0.125 % Soln Commonly known as:  DAKIN'S 1/4 STRENGTH Irrigate with as directed daily after lunch. Start taking on:  Feb 23, 2019        DISCHARGE INSTRUCTIONS:   Patient is getting transferred to [facility] service Dr. Jerral Ralph  DIET:  Cardiac diet  DISCHARGE CONDITION:  Fair  ACTIVITY:  Activity as tolerated  OXYGEN:  Home Oxygen: No.   Oxygen Delivery: room air  DISCHARGE LOCATION:    Patient is getting transferred to [facility] service Dr. Jerral Ralph  If you experience worsening of your admission symptoms, develop shortness of breath, life threatening emergency, suicidal or homicidal thoughts you must seek medical attention immediately by calling 911 or calling your MD immediately  if symptoms less severe.  You Must read complete instructions/literature along with all the possible adverse reactions/side effects for all the Medicines you take and that have been prescribed to you. Take any new Medicines after you have completely understood and accpet all the possible adverse reactions/side effects.   Please note  You were cared for by a hospitalist during your hospital stay. If you have any questions about your discharge medications or the care you received while you were in the hospital after you are discharged, you can call the unit and asked to speak with the hospitalist on call if the hospitalist that took care of you is not available. Once you are discharged, your primary care physician will handle any further medical issues. Please note that NO REFILLS for any discharge medications will be authorized once you are discharged, as it is imperative that you return to your primary care physician (or establish a relationship with a primary care physician if you do not have one)  for your aftercare needs so that they can reassess your need for medications and monitor your lab values.     Today  Chief Complaint  Patient presents with  . Abscess   Patient is feeling okay and agreeable for transfer to [facility] as he could not get IND done here  ROS:  CONSTITUTIONAL: Denies fevers, chills. Denies any fatigue, weakness.  EYES: Denies blurry vision, double vision, eye pain. EARS, NOSE, THROAT: Denies tinnitus, ear pain, hearing loss. RESPIRATORY: Denies cough, wheeze, shortness of breath.  CARDIOVASCULAR: Denies chest pain, palpitations, edema.  GASTROINTESTINAL: Denies nausea, vomiting, diarrhea, abdominal pain. Denies bright red blood per rectum. GENITOURINARY: Denies dysuria, hematuria. ENDOCRINE: Denies nocturia or thyroid problems. HEMATOLOGIC AND LYMPHATIC: Denies easy bruising or bleeding. SKIN: Denies rash or lesion.  Left buttock abscess MUSCULOSKELETAL: Denies pain in neck, back, shoulder, knees, hips or arthritic symptoms.  NEUROLOGIC: Denies paralysis, paresthesias.  PSYCHIATRIC: Denies anxiety or depressive symptoms.   VITAL SIGNS:  Blood pressure (!) 154/82, pulse 71, temperature 97.8 F (36.6 C), resp. rate 18, height  (1.702 m), weight (!) 209.8 kg, SpO2  98 %.  I/O:    Intake/Output Summary (Last 24 hours) at 02/22/2019 1552 Last data filed at 02/22/2019 1452 Gross per 24 hour  Intake 2066.77 ml  Output -  Net 2066.77 ml    PHYSICAL EXAMINATION:  GENERAL:  33 y.o.-year-old patient lying in the bed with no acute distress.  EYES: Pupils equal, round, reactive to light and accommodation. No scleral icterus. Extraocular muscles intact.  HEENT: Head atraumatic, normocephalic. Oropharynx and nasopharynx clear.  NECK:  Supple, no jugular venous distention. No thyroid enlargement, no tenderness.  LUNGS: Normal breath sounds bilaterally, no wheezing, rales,rhonchi or crepitation. No use of accessory muscles of respiration.   CARDIOVASCULAR: S1, S2 normal. No murmurs, rubs, or gallops.  ABDOMEN: Soft, non-tender, non-distended. Bowel sounds present. No organomegaly or mass.  EXTREMITIES: No pedal edema, cyanosis, or clubbing.  NEUROLOGIC: Cranial nerves II through XII are intact. Muscle strength 5/5 in all extremities. Sensation intact. Gait not checked.  PSYCHIATRIC: The patient is alert and oriented x 3.  SKIN: Left gluteal abscess which is tender and edematous with erythema somewhat purulent discharge  DATA REVIEW:   CBC Recent Labs  Lab 02/22/19 0510  WBC 10.6*  HGB 13.4  HCT 41.3  PLT 305    Chemistries  Recent Labs  Lab 02/20/19 2346 02/22/19 0510  NA 136  --   K 3.5  --   CL 102  --   CO2 26  --   GLUCOSE 144*  --   BUN 16  --   CREATININE 0.57* 0.55*  CALCIUM 8.5*  --     Cardiac Enzymes No results for input(s): TROPONINI in the last 168 hours.  Microbiology Results  Results for orders placed or performed during the hospital encounter of 02/21/19  SARS Coronavirus 2 (CEPHEID - Performed in [facility] lab), Hosp Order     Status: None   Collection Time: 02/21/19  4:34 AM  Result Value Ref Range Status   SARS Coronavirus 2 NEGATIVE NEGATIVE Final    Comment: (NOTE) If result is NEGATIVE SARS-CoV-2 target nucleic acids are NOT DETECTED. The SARS-CoV-2 RNA is generally detectable in upper and lower  respiratory specimens during the acute phase of infection. The lowest  concentration of SARS-CoV-2 viral copies this assay can detect is 250  copies / mL. A negative result does not preclude SARS-CoV-2 infection  and should not be used as the sole basis for treatment or other  patient management decisions.  A negative result may occur with  improper specimen collection / handling, submission of specimen other  than nasopharyngeal swab, presence of viral mutation(s) within the  areas targeted by this assay, and inadequate number of viral copies  (<250 copies / mL). A  negative result must be combined with clinical  observations, patient history, and epidemiological information. If result is POSITIVE SARS-CoV-2 target nucleic acids are DETECTED. The SARS-CoV-2 RNA is generally detectable in upper and lower  respiratory specimens dur ing the acute phase of infection.  Positive  results are indicative of active infection with SARS-CoV-2.  Clinical  correlation with patient history and other diagnostic information is  necessary to determine patient infection status.  Positive results do  not rule out bacterial infection or co-infection with other viruses. If result is PRESUMPTIVE POSTIVE SARS-CoV-2 nucleic acids MAY BE PRESENT.   A presumptive positive result was obtained on the submitted specimen  and confirmed on repeat testing.  While 2019 novel coronavirus  (SARS-CoV-2) nucleic acids may be present in the  submitted sample  additional confirmatory testing may be necessary for epidemiological  and / or clinical management purposes  to differentiate between  SARS-CoV-2 and other Sarbecovirus currently known to infect humans.  If clinically indicated additional testing with an alternate test  methodology [phone]) is advised. The SARS-CoV-2 RNA is generally  detectable in upper and lower respiratory sp ecimens during the acute  phase of infection. The expected result is Negative. Fact Sheet for Patients:  BoilerBrush.com.cy Fact Sheet for Healthcare Providers: https://pope.com/ This test is not yet approved or cleared by the [place] FDA and has been authorized for detection and/or diagnosis of SARS-CoV-2 by FDA under an Emergency Use Authorization (EUA).  This EUA will remain in effect (meaning this test can be used) for the duration of the COVID-19 declaration under Section 564(b)(1) of the Act, 21 U.S.C. section 360bbb-3(b)(1), unless the authorization is terminated or revoked sooner. Performed  at [facility], [address] [place], [place] [address]   Blood culture (routine x 2)     Status: None (Preliminary result)   Collection Time: 02/21/19  4:35 AM  Result Value Ref Range Status   Specimen Description BLOOD RIGHT ANTECUBITAL  Final   Special Requests   Final    BOTTLES DRAWN AEROBIC AND ANAEROBIC Blood Culture adequate volume   Culture   Final    NO GROWTH 1 DAY Performed at [facility], [address], [place], [place] [address]    Report Status PENDING  Incomplete  Blood culture (routine x 2)     Status: None (Preliminary result)   Collection Time: 02/21/19  4:35 AM  Result Value Ref Range Status   Specimen Description BLOOD LEFT ANTECUBITAL  Final   Special Requests   Final    BOTTLES DRAWN AEROBIC AND ANAEROBIC Blood Culture results may not be optimal due to an excessive volume of blood received in culture bottles   Culture   Final    NO GROWTH 1 DAY Performed at [facility], [address], [place], [place] [address]    Report Status PENDING  Incomplete    RADIOLOGY:  Ct Pelvis W Contrast  Result Date: 02/21/2019 CLINICAL DATA:  Left buttock abscess EXAM: CT PELVIS WITH CONTRAST TECHNIQUE: Multidetector CT imaging of the pelvis was performed using the standard protocol following the bolus administration of intravenous contrast. CONTRAST:  OMNIPAQUE IOHEXOL 300 MG/ML  SOLN COMPARISON:  None. FINDINGS: Urinary Tract:  Bladder is within normal limits. Bowel:  Visualized bowel is unremarkable. Vascular/Lymphatic: No evidence of aneurysm. Small retroperitoneal nodes, including 11 mm short axis left common iliac node (series 3/image 20). Small pelvic nodes bilaterally, including a 14 mm short axis left external iliac node (series 3/image 87), these are mildly prominent although likely reactive. Reproductive:  Prostate is unremarkable. Other:  No pelvic ascites. Musculoskeletal: Inflammatory stranding/phlegmonous change in the medial  left buttock adjacent to the gluteal cleft (series 11/image 97). No discrete fluid collection/abscess. Mild degenerative changes of the lower lumbar spine. IMPRESSION: Inflammatory stranding/phlegmonous change in the medial left buttock adjacent to the gluteal cleft, reflecting cellulitis. No discrete fluid collection/abscess. Small retroperitoneal/pelvic lymph nodes, likely reactive. Electronically Signed   By: Charline Bills M.D.   On: 02/21/2019 05:17    EKG:  No orders found for this or any previous visit.    Management plans discussed with the patient, he is in agreement.  CODE STATUS:     Code Status Orders  (From admission, onward)  Start     Ordered   02/21/19 0742  Full code  Continuous     02/21/19 0741        Code Status History    This patient has a current code status but no historical code status.      TOTAL TIME TAKING CARE OF THIS PATIENT: 45  minutes.   Note: This dictation was prepared with Dragon dictation along with smaller phrase technology. Any transcriptional errors that result from this process are unintentional.   @[facility] @  on 02/22/2019 at 3:52 PM  Between 7am to 6pm - Pager - [phone]  After 6pm go to [email or web address] - password [email or web address]  [facility]  Office  [phone]  CC: Primary care physician; Patient, No Pcp Per

## 2019-02-23 ENCOUNTER — Encounter (HOSPITAL_COMMUNITY): Payer: Self-pay | Admitting: *Deleted

## 2019-02-23 ENCOUNTER — Encounter (HOSPITAL_COMMUNITY)
Admission: AD | Disposition: A | Discharge status: Home / Self Care | Payer: Self-pay | Source: Other Acute Inpatient Hospital | Attending: Internal Medicine

## 2019-02-23 ENCOUNTER — Inpatient Hospital Stay (HOSPITAL_COMMUNITY): Payer: Self-pay | Admitting: Anesthesiology

## 2019-02-23 DIAGNOSIS — Z22322 Carrier or suspected carrier of Methicillin resistant Staphylococcus aureus: Secondary | ICD-10-CM

## 2019-02-23 HISTORY — PX: IRRIGATION AND DEBRIDEMENT ABSCESS: SHX5252

## 2019-02-23 LAB — CBC
HCT: 41.1 % (ref 39.0–52.0)
Hemoglobin: 13.5 g/dL (ref 13.0–17.0)
MCH: 27.8 pg (ref 26.0–34.0)
MCHC: 32.8 g/dL (ref 30.0–36.0)
MCV: 84.7 fL (ref 80.0–100.0)
Platelets: 329 10*3/uL (ref 150–400)
RBC: 4.85 MIL/uL (ref 4.22–5.81)
RDW: 13 % (ref 11.5–15.5)
WBC: 9.1 10*3/uL (ref 4.0–10.5)
nRBC: 0 % (ref 0.0–0.2)

## 2019-02-23 LAB — BASIC METABOLIC PANEL
Anion gap: 9 (ref 5–15)
BUN: 7 mg/dL (ref 6–20)
CO2: 25 mmol/L (ref 22–32)
Calcium: 8.3 mg/dL — ABNORMAL LOW (ref 8.9–10.3)
Chloride: 103 mmol/L (ref 98–111)
Creatinine, Ser: 0.49 mg/dL — ABNORMAL LOW (ref 0.61–1.24)
GFR calc Af Amer: >60 mL/min (ref >60)
GFR calc non Af Amer: >60 mL/min (ref >60)
Glucose, Bld: 99 mg/dL (ref 70–99)
Potassium: 3.4 mmol/L — ABNORMAL LOW (ref 3.5–5.1)
Sodium: 137 mmol/L (ref 135–145)

## 2019-02-23 LAB — SURGICAL PCR SCREEN
MRSA, PCR: POSITIVE — AB
Staphylococcus aureus: POSITIVE — AB

## 2019-02-23 LAB — HIV ANTIBODY (ROUTINE TESTING W REFLEX): HIV Screen 4th Generation wRfx: NONREACTIVE

## 2019-02-23 SURGERY — IRRIGATION AND DEBRIDEMENT ABSCESS
Anesthesia: General | Site: Buttocks | Laterality: Right

## 2019-02-23 MED ORDER — LACTATED RINGERS IV SOLN
INTRAVENOUS | Status: DC
  Administered 2019-02-23: 14:00:00 via INTRAVENOUS

## 2019-02-23 MED ORDER — ADULT MULTIVITAMIN W/MINERALS CH
1.0000 | ORAL_TABLET | Freq: Every day | ORAL | Status: DC
  Administered 2019-02-24: 1 via ORAL
  Filled 2019-02-23: qty 1

## 2019-02-23 MED ORDER — ONDANSETRON HCL 4 MG/2ML IJ SOLN
INTRAMUSCULAR | Status: DC | PRN
  Administered 2019-02-23: 4 mg via INTRAVENOUS

## 2019-02-23 MED ORDER — FENTANYL CITRATE (PF) 100 MCG/2ML IJ SOLN
25.0000 ug | Freq: Once | INTRAMUSCULAR | Status: DC
  Administered 2019-02-23: 25 ug via INTRAVENOUS

## 2019-02-23 MED ORDER — ENSURE MAX PROTEIN PO LIQD
11.0000 [oz_av] | Freq: Three times a day (TID) | ORAL | Status: DC
  Administered 2019-02-24: 11 [oz_av] via ORAL
  Filled 2019-02-23 (×3): qty 330

## 2019-02-23 MED ORDER — MIDAZOLAM HCL 5 MG/5ML IJ SOLN
INTRAMUSCULAR | Status: DC | PRN
  Administered 2019-02-23: 2 mg via INTRAVENOUS

## 2019-02-23 MED ORDER — FENTANYL CITRATE (PF) 250 MCG/5ML IJ SOLN
INTRAMUSCULAR | Status: AC
  Filled 2019-02-23: qty 5

## 2019-02-23 MED ORDER — PROPOFOL 10 MG/ML IV BOLUS
INTRAVENOUS | Status: DC | PRN
  Administered 2019-02-23: 250 mg via INTRAVENOUS
  Administered 2019-02-23 (×2): 100 mg via INTRAVENOUS
  Administered 2019-02-23: 50 mg via INTRAVENOUS

## 2019-02-23 MED ORDER — BUPIVACAINE-EPINEPHRINE (PF) 0.25% -1:200000 IJ SOLN
INTRAMUSCULAR | Status: AC
  Filled 2019-02-23: qty 30

## 2019-02-23 MED ORDER — FENTANYL CITRATE (PF) 100 MCG/2ML IJ SOLN
INTRAMUSCULAR | Status: AC
  Filled 2019-02-23: qty 2

## 2019-02-23 MED ORDER — LABETALOL HCL 5 MG/ML IV SOLN
5.0000 mg | INTRAVENOUS | Status: DC | PRN
  Filled 2019-02-23: qty 4

## 2019-02-23 MED ORDER — LIDOCAINE HCL (PF) 1 % IJ SOLN
INTRAMUSCULAR | Status: AC
  Filled 2019-02-23: qty 30

## 2019-02-23 MED ORDER — AMLODIPINE BESYLATE 10 MG PO TABS
10.0000 mg | ORAL_TABLET | Freq: Every day | ORAL | Status: DC
  Administered 2019-02-23 – 2019-02-24 (×2): 10 mg via ORAL
  Filled 2019-02-23 (×2): qty 1

## 2019-02-23 MED ORDER — MIDAZOLAM HCL 2 MG/2ML IJ SOLN
INTRAMUSCULAR | Status: AC
  Filled 2019-02-23: qty 2

## 2019-02-23 MED ORDER — SUCCINYLCHOLINE CHLORIDE 20 MG/ML IJ SOLN
INTRAMUSCULAR | Status: DC | PRN
  Administered 2019-02-23: 200 mg via INTRAVENOUS

## 2019-02-23 MED ORDER — 0.9 % SODIUM CHLORIDE (POUR BTL) OPTIME
TOPICAL | Status: DC | PRN
  Administered 2019-02-23: 1000 mL

## 2019-02-23 MED ORDER — LIDOCAINE 2% (20 MG/ML) 5 ML SYRINGE
INTRAMUSCULAR | Status: DC | PRN
  Administered 2019-02-23: 100 mg via INTRAVENOUS

## 2019-02-23 MED ORDER — PROPOFOL 10 MG/ML IV BOLUS
INTRAVENOUS | Status: AC
  Filled 2019-02-23: qty 20

## 2019-02-23 MED ORDER — FENTANYL CITRATE (PF) 100 MCG/2ML IJ SOLN
INTRAMUSCULAR | Status: DC | PRN
  Administered 2019-02-23: 50 ug via INTRAVENOUS

## 2019-02-23 MED ORDER — DEXAMETHASONE SODIUM PHOSPHATE 10 MG/ML IJ SOLN
INTRAMUSCULAR | Status: DC | PRN
  Administered 2019-02-23: 5 mg via INTRAVENOUS

## 2019-02-23 SURGICAL SUPPLY — 36 items
BNDG GAUZE ELAST 4 BULKY (GAUZE/BANDAGES/DRESSINGS) IMPLANT
CANISTER SUCT 3000ML PPV (MISCELLANEOUS) IMPLANT
CHLORAPREP W/TINT 26ML (MISCELLANEOUS) IMPLANT
CLEANER TIP ELECTROSURG 2X2 (MISCELLANEOUS) ×1 IMPLANT
COVER SURGICAL LIGHT HANDLE (MISCELLANEOUS) ×3 IMPLANT
COVER WAND RF STERILE (DRAPES) ×3 IMPLANT
DECANTER SPIKE VIAL GLASS SM (MISCELLANEOUS) IMPLANT
DRAPE HALF SHEET 40X57 (DRAPES) IMPLANT
DRAPE LAPAROSCOPIC ABDOMINAL (DRAPES) ×3 IMPLANT
DRSG PAD ABDOMINAL 8X10 ST (GAUZE/BANDAGES/DRESSINGS) ×2 IMPLANT
ELECT REM PT RETURN 9FT ADLT (ELECTROSURGICAL) ×3
ELECTRODE REM PT RTRN 9FT ADLT (ELECTROSURGICAL) ×1 IMPLANT
GAUZE 4X4 16PLY RFD (DISPOSABLE) IMPLANT
GAUZE SPONGE 4X4 12PLY STRL (GAUZE/BANDAGES/DRESSINGS) ×3 IMPLANT
GLOVE BIO SURGEON STRL SZ7 (GLOVE) ×3 IMPLANT
GLOVE BIOGEL PI IND STRL 7.5 (GLOVE) ×1 IMPLANT
GLOVE BIOGEL PI INDICATOR 7.5 (GLOVE) ×2
GOWN STRL REUS W/ TWL LRG LVL3 (GOWN DISPOSABLE) ×2 IMPLANT
GOWN STRL REUS W/TWL LRG LVL3 (GOWN DISPOSABLE) ×4
KIT BASIN OR (CUSTOM PROCEDURE TRAY) ×3 IMPLANT
KIT TURNOVER KIT B (KITS) ×3 IMPLANT
NDL HYPO 25GX1X1/2 BEV (NEEDLE) IMPLANT
NEEDLE HYPO 25GX1X1/2 BEV (NEEDLE) IMPLANT
NS IRRIG 1000ML POUR BTL (IV SOLUTION) ×3 IMPLANT
PACK GENERAL/GYN (CUSTOM PROCEDURE TRAY) ×3 IMPLANT
PAD ARMBOARD 7.5X6 YLW CONV (MISCELLANEOUS) ×6 IMPLANT
PENCIL SMOKE EVACUATOR (MISCELLANEOUS) ×3 IMPLANT
SPECIMEN JAR SMALL (MISCELLANEOUS) ×3 IMPLANT
SUT MNCRL AB 4-0 PS2 18 (SUTURE) IMPLANT
SUT VIC AB 3-0 SH 27 (SUTURE)
SUT VIC AB 3-0 SH 27XBRD (SUTURE) IMPLANT
SWAB COLLECTION DEVICE MRSA (MISCELLANEOUS) ×2 IMPLANT
SWAB CULTURE ESWAB REG 1ML (MISCELLANEOUS) ×2 IMPLANT
SYR CONTROL 10ML LL (SYRINGE) IMPLANT
TOWEL OR 17X24 6PK STRL BLUE (TOWEL DISPOSABLE) ×3 IMPLANT
TOWEL OR 17X26 10 PK STRL BLUE (TOWEL DISPOSABLE) ×3 IMPLANT

## 2019-02-23 NOTE — Transfer of Care (Signed)
Immediate Anesthesia Transfer of Care Note  Patient: Christopher Keith   Procedure(s) Performed: IRRIGATION AND DEBRIDEMENT PERI RECTAL ABSCESS (Right Buttocks)  Patient Location: PACU  Anesthesia Type:General  Level of Consciousness: awake, alert  and oriented  Airway & Oxygen Therapy: Patient Spontanous Breathing and Patient connected to face mask oxygen  Post-op Assessment: Report given to RN and Post -op Vital signs reviewed and stable  Post vital signs: Reviewed and stable  Last Vitals:  Vitals Value Taken Time  BP 126/66 02/23/2019  3:07 PM  Temp 36.1 C 02/23/2019  3:07 PM  Pulse 81 02/23/2019  3:12 PM  Resp 23 02/23/2019  3:12 PM  SpO2 99 % 02/23/2019  3:12 PM  Vitals shown include unvalidated device data.  Last Pain:  Vitals:   02/23/19 1507  TempSrc:   PainSc: 3          Complications: No apparent anesthesia complications

## 2019-02-23 NOTE — Op Note (Signed)
Preoperative diagnosis: left buttock abscess Postoperative diagnosis: same as above Procedure: incision and drainage buttock abscess Surgeon: Dr Harden Mo Anesthesia: general EBL minimal Complications none Drains none Specimens none Sponge and needle count correct times two dispo to recovery stable  Indications: 33 yom transfer from [facility] for buttock abscess due to anesthesia inability to care for him due to BMI.  As recommended to patient by Dr Hazle Quant he needed further drainage.  I discussed this with him today.  Procedure: After informed consent obtained patient was taken to the OR. He was on antibiotics.  SCDs were in place.  He was placed under general anesthesia without complication.  He was rolled into right lateral decubitus position. He was prepped and draped. Timeout performed.  I made a large elliptical incision and removed the overlying skin in region where he had a prior drainage. There was some purulence and a lot of induration. I was able to open the cavity widely.  I then packed this. Dressings placed.  Tolerated well and transferred to recovery stable.

## 2019-02-23 NOTE — Anesthesia Procedure Notes (Signed)
Procedure Name: Intubation Date/Time: 02/23/2019 2:30 PM Performed by: Trinna Post., CRNA Pre-anesthesia Checklist: Patient identified, Emergency Drugs available, Suction available, Patient being monitored and Timeout performed Patient Re-evaluated:Patient Re-evaluated prior to induction Oxygen Delivery Method: Circle system utilized Preoxygenation: Pre-oxygenation with 100% oxygen Induction Type: IV induction, Rapid sequence and Cricoid Pressure applied Laryngoscope Size: Mac and 4 Grade View: Grade I Tube type: Oral Tube size: 7.5 mm Number of attempts: 1 Airway Equipment and Method: Stylet Placement Confirmation: ETT inserted through vocal cords under direct vision,  positive ETCO2 and breath sounds checked- equal and bilateral Secured at: 23 cm Tube secured with: Tape Dental Injury: Teeth and Oropharynx as per pre-operative assessment

## 2019-02-23 NOTE — Consult Note (Signed)
[facility] Surgery Consult Note  [place] Banner 1986/09/19  [id number].     Chief Complaint/Reason for Consult: L buttock cellulitis  HPI:  Patient is a 33 year old male transferred here from [facility] for surgical evaluation of L buttock cellulitis. He is morbidly obese with HTN and anesthesia was not comfortable with putting patient to sleep at [facility]. Bedside I&D was attempted but cellulitis and purulent drainage not improving. Patient denied fever, chills, nausea, vomiting, diarrhea on admission. NKDA. Patient does not take any blood thinning medications at home but was on lovenox prior to transfer.   ROS: Review of Systems  Constitutional: Negative for chills and fever.  Respiratory: Negative for shortness of breath and wheezing.   Cardiovascular: Negative for chest pain.  Gastrointestinal: Negative for abdominal pain, diarrhea, nausea and vomiting.  Genitourinary: Negative for dysuria, frequency and urgency.       L buttock pain and swelling  All other systems reviewed and are negative.   Family History  Problem Relation Age of Onset  . Gastric cancer Mother   . Alcohol abuse Father   . Obesity Father   . Diabetes Mellitus II Brother     Past Medical History:  Diagnosis Date  . Hypertension   . Obesity     Past Surgical History:  Procedure Laterality Date  . ADENOIDECTOMY    . TONSILLECTOMY      Social History:  reports that he has been smoking cigarettes. He has never used smokeless tobacco. He reports current alcohol use. He reports that he does not use drugs.  Allergies: No Known Allergies  Medications Prior to Admission  Medication Sig Dispense Refill  . acetaminophen (TYLENOL) 325 MG tablet Take 2 tablets (650 mg total) by mouth every 6 (six) hours as needed for mild pain (or Fever >/= 101).    Marland Kitchen docusate sodium (COLACE) 100 MG capsule Take 1 capsule (100 mg total) by mouth 2 (two) times daily. 10 capsule 0  . enoxaparin (LOVENOX) 40 MG/0.4ML injection Inject 0.4  mLs (40 mg total) into the skin every 12 (twelve) hours. 0 Syringe   . nicotine (NICODERM CQ - DOSED IN MG/24 HOURS) 14 mg/24hr patch Place 1 patch (14 mg total) onto the skin daily. 28 patch 0  . ondansetron (ZOFRAN) 4 MG tablet Take 1 tablet (4 mg total) by mouth every 6 (six) hours as needed for nausea. 20 tablet 0  . oxyCODONE (OXY IR/ROXICODONE) 5 MG immediate release tablet Take 1 tablet (5 mg total) by mouth every 6 (six) hours as needed for severe pain. 30 tablet 0  . piperacillin-tazobactam (ZOSYN) 3.375 GM/50ML IVPB Inject 50 mLs (3.375 g total) into the vein every 8 (eight) hours. 50 mL   . sodium hypochlorite (DAKIN'S 1/4 STRENGTH) 0.125 % SOLN Irrigate with as directed daily after lunch.  0    Blood pressure (!) 144/68, pulse 70, temperature 98.8 F (37.1 C), temperature source Oral, resp. rate 18, SpO2 99 %. Physical Exam: Physical Exam Constitutional:      General: He is not in acute distress.    Appearance: He is morbidly obese. He is not toxic-appearing.  Neck:     Musculoskeletal: Normal range of motion and neck supple.  Pulmonary:     Effort: Pulmonary effort is normal.  Abdominal:     General: There is no distension.     Palpations: Abdomen is soft.     Tenderness: There is no abdominal tenderness.  Genitourinary:    Comments: L gluteal erythema and  induration with central area that is draining purulent material - when probed, purulent material also drains from other areas Musculoskeletal:     Comments: Moves all 4 extremities  Neurological:     Mental Status: He is alert and oriented to person, place, and time.     Motor: Motor function is intact.  Psychiatric:        Attention and Perception: Attention and perception normal.        Mood and Affect: Mood and affect normal.        Speech: Speech normal.        Behavior: Behavior is cooperative.     Results for orders placed or performed during the hospital encounter of 02/22/19 (from the past 48 hour(s))   CBC     Status: None   Collection Time: 02/23/19  1:33 AM  Result Value Ref Range   WBC 9.1 4.0 - 10.5 K/uL   RBC 4.85 4.22 - 5.81 MIL/uL   Hemoglobin 13.5 13.0 - 17.0 g/dL   HCT 16.141.1 09.639.0 - 04.552.0 %   MCV 84.7 80.0 - 100.0 fL   MCH 27.8 26.0 - 34.0 pg   MCHC 32.8 30.0 - 36.0 g/dL   RDW 40.913.0 81.111.5 - 91.415.5 %   Platelets 329 150 - 400 K/uL   nRBC 0.0 0.0 - 0.2 %    Comment: Performed at [facility], [address] [place], [place] [address]  Basic metabolic panel     Status: Abnormal   Collection Time: 02/23/19  1:33 AM  Result Value Ref Range   Sodium 137 135 - 145 mmol/L   Potassium 3.4 (L) 3.5 - 5.1 mmol/L   Chloride 103 98 - 111 mmol/L   CO2 25 22 - 32 mmol/L   Glucose, Bld 99 70 - 99 mg/dL   BUN 7 6 - 20 mg/dL   Creatinine, Ser 6.210.49 (L) 0.61 - 1.24 mg/dL   Calcium 8.3 (L) 8.9 - 10.3 mg/dL   GFR calc non Af Amer >60 >60 mL/min   GFR calc Af Amer >60 >60 mL/min   Anion gap 9 5 - 15    Comment: Performed at [facility], [address] [place], [place] [address]   No results found.    Assessment/Plan Morbid Obesity HTN  L buttock cellulitis with possible abscess - draining purulent material - WBC 9.1, afebrile - to OR for debridement later today  Wells GuilesKelly Rayburn, [facility] 02/23/2019, 8:27 AM Pager: [phone] Consults: [phone]

## 2019-02-23 NOTE — Progress Notes (Signed)
Initial Nutrition Assessment  RD working remotely.  DOCUMENTATION CODES:   Morbid obesity  INTERVENTION:   -MVI with minerals daily -Ensure Max po TID, each supplement provides 150 kcal and 30 grams of protein.   NUTRITION DIAGNOSIS:   Increased nutrient needs related to wound healing as evidenced by estimated needs.  GOAL:   Patient will meet greater than or equal to 90% of their needs  MONITOR:   PO intake, Supplement acceptance, Diet advancement, Labs, Weight trends, Skin, I & O's  REASON FOR ASSESSMENT:   Other (Comment)    ASSESSMENT:   Christopher Keith is a 33 y.o. male with medical history significant for hypertension and morbid obesity who [facility] with a wound on his left buttocks.  He first noticed this about a week ago.  He felt the size of the wound increasing and was scratching at the area and eventually popped open on his own using his fingers.  He thought he had seen pus oozing from the area as well as significant skin breakdown.  He had noticed worsening pain and irritation to the area when applying direct pressure from friction on his pants.  He decided to [facility] ED for further evaluation and management.  Pt admitted with cellulitis and abscess of lt buttocks.  5/17- s[facility]p bedside [facility]; [facility] for further surgical interventions  Reviewed I/O's: +240 ml x 24 hours  Per general surgery notes, plan for I&D today.   Pt in OR at time of visit. Unable to reach to obtain further history at this time.   Per [facility] note on 02/22/19; buttocks wound difficult to assess due to presence of necrotic tissue. Pt was transferred to bariatric mattress with low air loss feature.   Reviewed CareEverywhere records. Noted pt has been following with would clinic, however, last visit in October 2019.   Pt with increased nutritional needs and would benefit from addition of nutritional supplements.   Labs reviewed: K: 3.4.   NUTRITION - FOCUSED  PHYSICAL EXAM:    Most Recent Value  Orbital Region  Unable to assess  Upper Arm Region  Unable to assess  Thoracic and Lumbar Region  Unable to assess  Buccal Region  Unable to assess  Temple Region  Unable to assess  Clavicle Bone Region  Unable to assess  Clavicle and Acromion Bone Region  Unable to assess  Scapular Bone Region  Unable to assess  Dorsal Hand  Unable to assess  Patellar Region  Unable to assess  Anterior Thigh Region  Unable to assess  Posterior Calf Region  Unable to assess  Edema (RD Assessment)  Unable to assess  Hair  Unable to assess  Eyes  Unable to assess  Mouth  Unable to assess  Skin  Unable to assess  Nails  Unable to assess       Diet Order:   Diet Order            Diet NPO time specified  Diet effective midnight              EDUCATION NEEDS:   No education needs have been identified at this time  Skin:  Skin Assessment: Skin Integrity Issues: Skin Integrity Issues:: Stage IV, Other (Comment) Stage IV: buttocks Other: non-pressurw wound to buttocks  Last BM:  02/22/19  Height:   Ht Readings from Last 1 Encounters:  02/23/19 5\' 7"  (1.702 m)    Weight:   Wt Readings from Last 1 Encounters:  02/23/19 (!) 209.8 kg    Ideal Body Weight:  67.3 kg  BMI:  Body mass index is 72.44 kg/m.  Estimated Nutritional Needs:   Kcal:  2000-2200  Protein:  135-150 grams  Fluid:  > 2 L    Teresina Bugaj A. Mayford Knife, RD, LDN, CDCES Registered Dietitian II Certified Diabetes Care and Education Specialist Pager: [phone] After hours Pager: [phone]

## 2019-02-23 NOTE — TOC Initial Note (Signed)
Transition of Care [facility](TOC) - Initial/Assessment Note    Patient Details  Name: Christopher Keith MRN: [id number] Date of Birth: 02-08-1986  Transition of Care [facility](TOC) CM/SW Contact:    Doy HutchingIsabel H Landrie Beale, LCSWA Phone Number: 02/23/2019, 11:29 AM  Clinical Narrative:                 CSW spoke with pt at bedside, introduced self, role and reason for visit. Pt [place] with his brother and [place]. [place] and moved down here to be with family. Pt does not have a primary care physician and is open to getting a f/u appointment arranged. We will [facility] at discharge as pt does not have a consistent pharmacy (states he utilizes whoever has the most affordable prices).   Pt pleasant and in no apparent distress, understands he will stay in the hospital until he is stable and able to go home.   Expected Discharge Plan: Home/Self Care Barriers to Discharge: Continued Medical Work up, Inadequate or no insurance   Patient Goals and CMS Choice Patient states their goals for this hospitalization and ongoing recovery are:: to get this (the wound) taken care of   Choice offered to / list presented to : Patient  Expected Discharge Plan and Services Expected Discharge Plan: Home/Self Care In-house Referral: Financial Counselor Discharge Planning Services: CM Consult, Follow-up appt scheduled, Medication Assistance   Living arrangements for the past 2 months: No permanent address(lives in an RV at a truck stop) Expected Discharge Date: 02/27/19                                    Prior Living Arrangements/Services Living arrangements for the past 2 months: No permanent address(lives in an RV at a truck stop) Lives with:: Siblings Patient language and need for interpreter reviewed:: Yes(no needs) Do you feel safe going back to the place where you live?: Yes      Need for Family Participation in Patient Care: No  (Comment) Care giver support system in place?: Yes (comment)(siblings)   Criminal Activity/Legal Involvement Pertinent to Current Situation/Hospitalization: No - Comment as needed  Activities of Daily Living Home Assistive Devices/Equipment: None ADL Screening (condition at time of admission) Patient's cognitive ability adequate to safely complete daily activities?: Yes Is the patient deaf or have difficulty hearing?: No Does the patient have difficulty seeing, even when wearing glasses/contacts?: No Does the patient have difficulty concentrating, remembering, or making decisions?: No Patient able to express need for assistance with ADLs?: Yes Does the patient have difficulty dressing or bathing?: No Independently performs ADLs?: Yes (appropriate for developmental age) Does the patient have difficulty walking or climbing stairs?: No Weakness of Legs: None Weakness of Arms/Hands: None  Permission Sought/Granted Permission sought to share information with : Case Manager Permission granted to share information with : Yes, Verbal Permission Granted              Emotional Assessment Appearance:: Appears stated age Attitude/Demeanor/Rapport: Engaged, Gracious Affect (typically observed): Accepting, Adaptable, Pleasant Orientation: : Oriented to Self, Oriented to Place, Oriented to  Time, Oriented to Situation Alcohol / Substance Use: Tobacco Use Psych Involvement: No (comment)  Admission diagnosis:  ABSCESS RECTAL Patient Active Problem List   Diagnosis Date Noted  . Pressure injury of skin 02/22/2019  . Hypertension 02/22/2019  . Cellulitis of  left buttock 02/21/2019   PCP:  Patient, No Pcp Per Pharmacy:   [id number] [place], [place] - [address] AT [facility] [address] [place] [id number] Phone: [phone] Fax: [phone]  [facility] [place], [place] - [address] [place] [address] Phone: [phone] Fax: [phone]     Social Determinants of Health (SDOH) Interventions    Readmission Risk Interventions No flowsheet data found.

## 2019-02-23 NOTE — Anesthesia Preprocedure Evaluation (Signed)
Anesthesia Evaluation  Patient identified by MRN, date of birth, ID band Patient awake    Reviewed: Allergy & Precautions, NPO status , Patient's Chart, lab work & pertinent test results  History of Anesthesia Complications Negative for: history of anesthetic complications  Airway Mallampati: I  TM Distance: >3 FB Neck ROM: Full   Comment: Large, thick neck w/ beard Dental  (+) Teeth Intact   Pulmonary neg pulmonary ROS, Current Smoker,    Pulmonary exam normal        Cardiovascular hypertension, Normal cardiovascular exam     Neuro/Psych negative neurological ROS  negative psych ROS   GI/Hepatic negative GI ROS, Neg liver ROS,   Endo/Other  Morbid obesity (BMI >70)  Renal/GU negative Renal ROS  negative genitourinary   Musculoskeletal negative musculoskeletal ROS (+)   Abdominal   Peds  Hematology negative hematology ROS (+)   Anesthesia Other Findings   Reproductive/Obstetrics                             Anesthesia Physical Anesthesia Plan  ASA: III  Anesthesia Plan: General   Post-op Pain Management:    Induction: Intravenous and Rapid sequence  PONV Risk Score and Plan: 1  Airway Management Planned:   Additional Equipment:   Intra-op Plan:   Post-operative Plan:   Informed Consent:   Plan Discussed with:   Anesthesia Plan Comments:         Anesthesia Quick Evaluation

## 2019-02-23 NOTE — Progress Notes (Signed)
[facility] PROGRESS NOTE  Christopher EmeryJuan Keith ZOX:[id number] DOB: [birth year] DOA: 02/22/2019 PCP: Patient, No Pcp Per  Assessment/Plan:  Cellulitis and abscess of left buttocks: Status post bedside I&D by general surgery at [facility] on 02/21/2019.  Surgery at [facility] felt further I&D in the OR and patient was transferred to [facility] due to bariatric concerns.[place] consulted and taking to OR today. He is afebrile and non-toxic appearing. -Continue IV Zosyn for now -pain management -further management per surgery  Hypertension:BP mildly elevated.  Patient states he is supposed to be taking lisinopril-HCTZ as an outpatient but has difficulty affording it. -amlodipine  -prn hydralazine -pain management  Hyponatremia. Mild. -replete -recheck  Obesity: bmi 72. -nutritional consult  Code Status: full Family Communication: patient Disposition Plan: home   Consultants:  General surger  Procedures:  I and D 02/23/19  Antibiotics:  Zosyn 02/22/19>>>  HPI/Subjective: 33 yo morbidly obese hx htn admitted for I and D of infection on buttocks in OR.  Ambulating in room with steady gait. No acute distress  Objective: Vitals:   02/22/19 1813 02/23/19 1018  BP: (!) 144/68 133/60  Pulse: 70   Resp: 18   Temp: 98.8 F (37.1 C)   SpO2: 99%     Intake/Output Summary (Last 24 hours) at 02/23/2019 1331 Last data filed at 02/23/2019 0941 Gross per 24 hour  Intake 240 ml  Output -  Net 240 ml   Filed Weights   02/23/19 1251  Weight: (!) 209.8 kg    Exam:   General:  Obese alert in no acute distress  Cardiovascular: rrr no mgr no LE edema  Respiratory: normal effort BS clear bilaterally no wheeze  Abdomen: obese soft +BS no guarding or rebounding  Musculoskeletal: joints without swelling/erythema   Data Reviewed: Basic Metabolic Panel: Recent Labs  Lab 02/20/19 2346 02/22/19 0510 02/23/19 0133  NA 136  --  137  K 3.5  --  3.4*  CL 102  --  103   CO2 26  --  25  GLUCOSE 144*  --  99  BUN 16  --  7  CREATININE 0.57* 0.55* 0.49*  CALCIUM 8.5*  --  8.3*   Liver Function Tests: No results for input(s): AST, ALT, ALKPHOS, BILITOT, PROT, ALBUMIN in the last 168 hours. No results for input(s): LIPASE, AMYLASE in the last 168 hours. No results for input(s): AMMONIA in the last 168 hours. CBC: Recent Labs  Lab 02/20/19 2346 02/22/19 0510 02/23/19 0133  WBC 15.4* 10.6* 9.1  NEUTROABS 10.9*  --   --   HGB 14.2 13.4 13.5  HCT 43.4 41.3 41.1  MCV 85.4 86.8 84.7  PLT 332 305 329   Cardiac Enzymes: No results for input(s): CKTOTAL, CKMB, CKMBINDEX, TROPONINI in the last 168 hours. BNP (last 3 results) No results for input(s): BNP in the last 8760 hours.  ProBNP (last 3 results) No results for input(s): PROBNP in the last 8760 hours.  CBG: No results for input(s): GLUCAP in the last 168 hours.  Recent Results (from the past 240 hour(s))  SARS Coronavirus 2 (CEPHEID - Performed in [facility] hospital lab), Hosp Order     Status: None   Collection Time: 02/21/19  4:34 AM  Result Value Ref Range Status   SARS Coronavirus 2 NEGATIVE NEGATIVE Final    Comment: (NOTE) If result is NEGATIVE SARS-CoV-2 target nucleic acids are NOT DETECTED. The SARS-CoV-2 RNA is generally detectable in upper and lower  respiratory specimens during the acute  phase of infection. The lowest  concentration of SARS-CoV-2 viral copies this assay can detect is 250  copies / mL. A negative result does not preclude SARS-CoV-2 infection  and should not be used as the sole basis for treatment or other  patient management decisions.  A negative result may occur with  improper specimen collection / handling, submission of specimen other  than nasopharyngeal swab, presence of viral mutation(s) within the  areas targeted by this assay, and inadequate number of viral copies  (<250 copies / mL). A negative result must be combined with clinical  observations,  patient history, and epidemiological information. If result is POSITIVE SARS-CoV-2 target nucleic acids are DETECTED. The SARS-CoV-2 RNA is generally detectable in upper and lower  respiratory specimens dur ing the acute phase of infection.  Positive  results are indicative of active infection with SARS-CoV-2.  Clinical  correlation with patient history and other diagnostic information is  necessary to determine patient infection status.  Positive results do  not rule out bacterial infection or co-infection with other viruses. If result is PRESUMPTIVE POSTIVE SARS-CoV-2 nucleic acids MAY BE PRESENT.   A presumptive positive result was obtained on the submitted specimen  and confirmed on repeat testing.  While 2019 novel coronavirus  (SARS-CoV-2) nucleic acids may be present in the submitted sample  additional confirmatory testing may be necessary for epidemiological  and / or clinical management purposes  to differentiate between  SARS-CoV-2 and other Sarbecovirus currently known to infect humans.  If clinically indicated additional testing with an alternate test  methodology [phone]) is advised. The SARS-CoV-2 RNA is generally  detectable in upper and lower respiratory sp ecimens during the acute  phase of infection. The expected result is Negative. Fact Sheet for Patients:  BoilerBrush.com.cy Fact Sheet for Healthcare Providers: https://pope.com/ This test is not yet approved or cleared by the [place] FDA and has been authorized for detection and/or diagnosis of SARS-CoV-2 by FDA under an Emergency Use Authorization (EUA).  This EUA will remain in effect (meaning this test can be used) for the duration of the COVID-19 declaration under Section 564(b)(1) of the Act, 21 U.S.C. section 360bbb-3(b)(1), unless the authorization is terminated or revoked sooner. Performed at [facility], [address]  [place], [place] [address]   Blood culture (routine x 2)     Status: None (Preliminary result)   Collection Time: 02/21/19  4:35 AM  Result Value Ref Range Status   Specimen Description BLOOD RIGHT ANTECUBITAL  Final   Special Requests   Final    BOTTLES DRAWN AEROBIC AND ANAEROBIC Blood Culture adequate volume   Culture   Final    NO GROWTH 2 DAYS Performed at [facility], [address] [place], [place] [address]    Report Status PENDING  Incomplete  Blood culture (routine x 2)     Status: None (Preliminary result)   Collection Time: 02/21/19  4:35 AM  Result Value Ref Range Status   Specimen Description BLOOD LEFT ANTECUBITAL  Final   Special Requests   Final    BOTTLES DRAWN AEROBIC AND ANAEROBIC Blood Culture results may not be optimal due to an excessive volume of blood received in culture bottles   Culture   Final    NO GROWTH 2 DAYS Performed at [facility], [address], [place], [place] [address]    Report Status PENDING  Incomplete  Surgical pcr screen     Status: Abnormal   Collection Time: 02/23/19  10:22 AM  Result Value Ref Range Status   MRSA, PCR POSITIVE (A) NEGATIVE Final    Comment: CRITICAL RESULT CALLED TO, READ BACK BY AND VERIFIED WITH: J. TORRES, RN AT 1240 ON 02/23/19 BY C. JESSUP, MLT.    Staphylococcus aureus POSITIVE (A) NEGATIVE Final    Comment: (NOTE) The Xpert SA Assay (FDA approved for NASAL specimens in patients 18 years of age and older), is one component of a comprehensive surveillance program. It is not intended to diagnose infection nor to guide or monitor treatment. Performed at [facility], [address] [place], [place] [address]      Studies: No results found.  Scheduled Meds: . [MAR Hold] amLODipine  10 mg Oral Daily  . [START ON 02/24/2019] multivitamin with minerals  1 tablet Oral Daily  . [START ON 02/24/2019] Ensure Max Protein  11 oz Oral TID BM  . [MAR Hold] sodium hypochlorite  1 application  Irrigation BID   Continuous Infusions: . lactated ringers    . [MAR Hold] piperacillin-tazobactam (ZOSYN)  IV 3.375 g (02/23/19 0547)    Principal Problem:   Cellulitis of left buttock Active Problems:   Hypertension   MRSA carrier    Time spent: 45 minutes    [facility] M NP  [facility]  If 7PM-7AM, please contact night-coverage at [email or web address], password [facility] 02/23/2019, 1:31 PM  LOS: 1 day

## 2019-02-23 NOTE — Progress Notes (Signed)
Patient transported off unit to OR for procedure. 

## 2019-02-23 NOTE — Plan of Care (Signed)
  Problem: Clinical Measurements: Goal: Ability to avoid or minimize complications of infection will improve Outcome: Progressing   Problem: Skin Integrity: Goal: Skin integrity will improve Outcome: Progressing   Problem: Education: Goal: Knowledge of General Education information will improve Description Including pain rating scale, medication(s)/side effects and non-pharmacologic comfort measures Outcome: Progressing   Problem: Health Behavior/Discharge Planning: Goal: Ability to manage health-related needs will improve Outcome: Progressing   Problem: Clinical Measurements: Goal: Ability to maintain clinical measurements within normal limits will improve Outcome: Progressing Goal: Will remain free from infection Outcome: Progressing Goal: Respiratory complications will improve Outcome: Progressing Goal: Cardiovascular complication will be avoided Outcome: Progressing   Problem: Activity: Goal: Risk for activity intolerance will decrease Outcome: Progressing   Problem: Nutrition: Goal: Adequate nutrition will be maintained Outcome: Progressing   Problem: Elimination: Goal: Will not experience complications related to bowel motility Outcome: Progressing Goal: Will not experience complications related to urinary retention Outcome: Progressing   Problem: Pain Managment: Goal: General experience of comfort will improve Outcome: Progressing   Problem: Skin Integrity: Goal: Risk for impaired skin integrity will decrease Outcome: Progressing

## 2019-02-24 ENCOUNTER — Encounter (HOSPITAL_COMMUNITY): Payer: Self-pay | Admitting: General Surgery

## 2019-02-24 ENCOUNTER — Other Ambulatory Visit: Payer: Self-pay

## 2019-02-24 LAB — BASIC METABOLIC PANEL
Anion gap: 12 (ref 5–15)
BUN: 7 mg/dL (ref 6–20)
CO2: 22 mmol/L (ref 22–32)
Calcium: 8.9 mg/dL (ref 8.9–10.3)
Chloride: 102 mmol/L (ref 98–111)
Creatinine, Ser: 0.52 mg/dL — ABNORMAL LOW (ref 0.61–1.24)
GFR calc Af Amer: >60 mL/min (ref >60)
GFR calc non Af Amer: >60 mL/min (ref >60)
Glucose, Bld: 120 mg/dL — ABNORMAL HIGH (ref 70–99)
Potassium: 4.2 mmol/L (ref 3.5–5.1)
Sodium: 136 mmol/L (ref 135–145)

## 2019-02-24 LAB — CBC
HCT: 44.2 % (ref 39.0–52.0)
Hemoglobin: 14.8 g/dL (ref 13.0–17.0)
MCH: 28 pg (ref 26.0–34.0)
MCHC: 33.5 g/dL (ref 30.0–36.0)
MCV: 83.7 fL (ref 80.0–100.0)
Platelets: 395 10*3/uL (ref 150–400)
RBC: 5.28 MIL/uL (ref 4.22–5.81)
RDW: 12.6 % (ref 11.5–15.5)
WBC: 11 10*3/uL — ABNORMAL HIGH (ref 4.0–10.5)
nRBC: 0 % (ref 0.0–0.2)

## 2019-02-24 MED ORDER — SULFAMETHOXAZOLE-TRIMETHOPRIM 800-160 MG PO TABS: 1.0000 | ORAL_TABLET | Freq: Two times a day (BID) | ORAL | 0 refills | Status: DC

## 2019-02-24 MED ORDER — HYDROMORPHONE HCL 1 MG/ML IJ SOLN
0.5000 mg | Freq: Once | INTRAMUSCULAR | Status: AC
  Administered 2019-02-24: 08:00:00 0.5 mg via INTRAVENOUS
  Filled 2019-02-24: qty 1

## 2019-02-24 MED ORDER — SULFAMETHOXAZOLE-TRIMETHOPRIM 800-160 MG PO TABS: 1.0000 | ORAL_TABLET | Freq: Two times a day (BID) | ORAL | 0 refills | Status: AC

## 2019-02-24 MED ORDER — ENSURE MAX PROTEIN PO LIQD: 11.0000 [oz_av] | Freq: Three times a day (TID) | ORAL | Status: DC

## 2019-02-24 MED ORDER — AMLODIPINE BESYLATE 10 MG PO TABS: 10.0000 mg | ORAL_TABLET | Freq: Every day | ORAL | 1 refills | Status: DC

## 2019-02-24 MED ORDER — SULFAMETHOXAZOLE-TRIMETHOPRIM 800-160 MG PO TABS
1.0000 | ORAL_TABLET | Freq: Two times a day (BID) | ORAL | Status: DC
  Administered 2019-02-24: 1 via ORAL
  Filled 2019-02-24: qty 1

## 2019-02-24 MED ORDER — ADULT MULTIVITAMIN W/MINERALS CH: 1.0000 | ORAL_TABLET | Freq: Every day | ORAL | Status: DC

## 2019-02-24 MED ORDER — DOCUSATE SODIUM 100 MG PO CAPS: 100.0000 mg | ORAL_CAPSULE | Freq: Two times a day (BID) | ORAL | Status: DC

## 2019-02-24 MED FILL — SULFAMETHOXAZOLE-TMP DS TAB: 800-160 | 5 days supply | Qty: 10 | Fill #0

## 2019-02-24 MED FILL — AMLODIPINE BESYLATE 10 MG T: 10 | 30 days supply | Qty: 30 | Fill #0

## 2019-02-24 NOTE — Progress Notes (Signed)
Christopher Keith to be D/C'd  per MD order. Discussed with the patient and all questions fully answered.  VSS, Skin clean, dry and intact without evidence of skin break down, no evidence of skin tears noted.  IV catheter discontinued intact. Site without signs and symptoms of complications. Dressing and pressure applied.  An After Visit Summary was printed and given to the patient. Patient received prescription.  D/c education completed with patient/family including follow up instructions, medication list, d/c activities limitations if indicated, with other d/c instructions as indicated by MD - patient able to verbalize understanding, all questions fully answered.   Patient instructed to return to ED, call 911, or call MD for any changes in condition.   Patient to be escorted via WC, and D/C home via private auto.

## 2019-02-24 NOTE — Anesthesia Postprocedure Evaluation (Signed)
Anesthesia Post Note  Patient: Wynetta Emery  Procedure(s) Performed: IRRIGATION AND DEBRIDEMENT PERI RECTAL ABSCESS (Right Buttocks)     Patient location during evaluation: PACU Anesthesia Type: General Level of consciousness: awake and alert Pain management: pain level controlled Vital Signs Assessment: post-procedure vital signs reviewed and stable Respiratory status: spontaneous breathing, nonlabored ventilation and respiratory function stable Cardiovascular status: blood pressure returned to baseline and stable Postop Assessment: no apparent nausea or vomiting Anesthetic complications: no    Last Vitals:  Vitals:   02/24/19 0210 02/24/19 0531  BP: (!) 136/96 (!) 142/70  Pulse: 62 63  Resp: 18 16  Temp: (!) 36.3 C 36.7 C  SpO2: 96% 99%    Last Pain:  Vitals:   02/24/19 0747  TempSrc:   PainSc: 5    Pain Goal:                   Lucretia Kern

## 2019-02-24 NOTE — TOC Transition Note (Signed)
Transition of Care [facility]) - CM/SW Discharge Note   Patient Details  Name: Christopher Keith MRN: [id number] Date of Birth: Jan 16, 1986  [facility]) CM/SW Contact:  Kingsley Plan, RN Phone Number: 02/24/2019, 10:43 AM   Clinical Narrative:     Prescription for Bactrim DS sent to [facility], entered in [facility] with no co pay     Barriers to Discharge: No Barriers Identified   Patient Goals and CMS Choice Patient states their goals for this hospitalization and ongoing recovery are:: to get this (the wound) taken care of   Choice offered to / list presented to : Patient  Discharge Placement                       Discharge Plan and Services In-house Referral: Financial Counselor Discharge Planning Services: CM Consult, [facility]                                 Social Determinants of Health (SDOH) Interventions     Readmission Risk Interventions No flowsheet data found.

## 2019-02-24 NOTE — Care Management (Signed)
Provided patient with application for [facility] in [place].    Ronny Flurry RN BSN [phone]

## 2019-02-24 NOTE — Progress Notes (Signed)
[facility] Progress Note  1 Day Post-Op  Subjective: CC: L buttock wound Feeling better than he felt yesterday. Tolerated dressing change well.   Objective: Vital signs in last 24 hours: Temp:  [97 F (36.1 C)-98.5 F (36.9 C)] 98 F (36.7 C) (05/20 0531) Pulse Rate:  [62-85] 63 (05/20 0531) Resp:  [16-22] 16 (05/20 0531) BP: (119-145)/(52-96) 142/70 (05/20 0531) SpO2:  [94 %-99 %] 99 % (05/20 0531) Weight:  [209.8 kg] 209.8 kg (05/19 1251) Last BM Date: 02/23/19  Intake/Output from previous day: 05/19 0701 - 05/20 0700 In: 1403.8 [P.O.:840; I.V.:400; IV Piggyback:163.8] Out: 10 [Blood:10] Intake/Output this shift: No intake/output data recorded.  PE: Gen:  Alert, NAD, pleasant Pulm:  Normal effor Abd: Soft, non-tender, non-distended GU: L buttock with some erythema and induration and wound is 10 cm in depth - packing removed with some bloody drainage Psych: A&Ox3   Lab Results:  Recent Labs    02/23/19 0133 02/24/19 0459  WBC 9.1 11.0*  HGB 13.5 14.8  HCT 41.1 44.2  PLT 329 395   BMET Recent Labs    02/23/19 0133 02/24/19 0459  NA 137 136  K 3.4* 4.2  CL 103 102  CO2 25 22  GLUCOSE 99 120*  BUN 7 7  CREATININE 0.49* 0.52*  CALCIUM 8.3* 8.9   PT/INR No results for input(s): LABPROT, INR in the last 72 hours. CMP     Component Value Date/Time   NA 136 02/24/2019 0459   NA 136 10/21/2013 0711   K 4.2 02/24/2019 0459   K 3.9 10/21/2013 0711   CL 102 02/24/2019 0459   CL 104 10/21/2013 0711   CO2 22 02/24/2019 0459   CO2 25 10/21/2013 0711   GLUCOSE 120 (H) 02/24/2019 0459   GLUCOSE 115 (H) 10/21/2013 0711   BUN 7 02/24/2019 0459   BUN 14 10/21/2013 0711   CREATININE 0.52 (L) 02/24/2019 0459   CREATININE 0.52 (L) 10/21/2013 0711   CALCIUM 8.9 02/24/2019 0459   CALCIUM 8.6 10/21/2013 0711   GFRNONAA >60 02/24/2019 0459   GFRNONAA >60 10/21/2013 0711   GFRAA >60 02/24/2019 0459   GFRAA >60 10/21/2013 0711   Lipase  No  results found for: LIPASE     Studies/Results: No results found.  Anti-infectives: Anti-infectives (From admission, onward)   Start     Dose/Rate Route Frequency Ordered Stop   02/22/19 2200  piperacillin-tazobactam (ZOSYN) IVPB 3.375 g     3.375 g 12.5 mL/hr over 240 Minutes Intravenous Every 8 hours 02/22/19 2048         Assessment/Plan Morbid Obesity HTN  L buttock cellulitis with abscess - s/p I&D yesterday in OR - wound with depth of 10 cm - BID dressing changes, pack loosely with saline moistened gauze - sitz baths if able, can shower if unable to do sitz - change prn for bowel movements - continue abx - stable for discharge from a surgical perspective with follow up in [facility] clinic  FEN: HH/CM VTE: lovenox ID: [id number] 5/18>>  LOS: 2 days    Wells Guiles , [facility] 02/24/2019, 8:49 AM Pager: [phone] Consults: [phone]

## 2019-02-24 NOTE — Discharge Instructions (Signed)
Incision and Drainage, Care After  Refer to this sheet in the next few weeks. These instructions provide you with information about caring for yourself after your procedure. Your health care provider may also give you more specific instructions. Your treatment has been planned according to current medical practices, but problems sometimes occur. Call your health care provider if you have any problems or questions after your procedure.  What can I expect after the procedure?  After the procedure, it is common to have:  · Pain or discomfort around your incision site.  · Drainage from your incision.  Follow these instructions at home:  · Take over-the-counter and prescription medicines only as told by your health care provider.  · If you were prescribed an antibiotic medicine, take it as told by your health care provider. Do not stop taking the antibiotic even if you start to feel better.  · Follow instructions from your health care provider about:  ? How to take care of your incision.  ? When and how you should change your packing and bandage (dressing). Wash your hands with soap and water before you change your dressing. If soap and water are not available, use hand sanitizer.  ? When you should remove your dressing.  · Do not take baths, swim, or use a hot tub until your health care provider approves.  · Keep all follow-up visits as told by your health care provider. This is important.  · Check your incision area every day for signs of infection. Check for:  ? More redness, swelling, or pain.  ? More fluid or blood.  ? Warmth.  ? Pus or a bad smell.  Contact a health care provider if:  · Your cyst or abscess returns.  · You have a fever.  · You have more redness, swelling, or pain around your incision.  · You have more fluid or blood coming from your incision.  · Your incision feels warm to the touch.  · You have pus or a bad smell coming from your incision.  Get help right away if:  · You have severe pain or  bleeding.  · You cannot eat or drink without vomiting.  · You have decreased urine output.  · You become short of breath.  · You have chest pain.  · You cough up blood.  · The area where the incision and drainage occurred becomes numb or it tingles.  This information is not intended to replace advice given to you by your health care provider. Make sure you discuss any questions you have with your health care provider.  Document Released: 12/16/2011 Document Revised: 02/23/2016 Document Reviewed: 07/14/2015  Elsevier Interactive Patient Education © 2019 Elsevier Inc.

## 2019-02-24 NOTE — Discharge Summary (Addendum)
Physician Discharge Summary  Christopher Keith ZOX:[id number] DOB: [birth year] DOA: 02/22/2019  PCP: Patient, No Pcp Per  Admit date: 02/22/2019 Discharge date: 02/24/2019  Time spent: 45 minutes  Recommendations for Outpatient Follow-up:  1. Follow up with general surgery 1-2 weeks for evaluation of healing of buttock wound 2. Take medication as prescribed 3. Follow up with PCP 2-3 weeks for evaluation of blood pressure controll Dressing changes twice daily as instructed: BID dressing changes, pack loosely with saline moistened gauze - sitz baths if able, can shower if unable to do sitz   Discharge Diagnoses:  Principal Problem:   Cellulitis of left buttock Active Problems:   Hypertension   MRSA carrier   Obesity, morbid (HCC)   Discharge Condition: stable  Diet recommendation: heart healthy  Filed Weights   02/23/19 1251  Weight: (!) 209.8 kg    History of present illness:  33 yo hx obesity, htn admitted from [facility] for surgical evaluation of L buttock cellulitis. Bedside I&D attempted but cellulitis and purulent drainage persisted. No fever, chills, nausea or diarrhea.   Hospital Course:  Cellulitisand abscessof left buttocks: Status post bedside I&D by general surgery at [facility] on 02/21/2019 and I&D [facility] at [facility] in OR 02/23/19. He remained afebrile and non-toxic appearing. He received IV zosyn. Will discharge with Bactrim for 5 more days. Instructed dressing change twice daily.   Hypertension:BP mildly elevated. Patient states he is supposed to be taking lisinopril-HCTZ as an outpatient but has difficulty affording it. Norvasc started and continued at discharge. Improved control at discharge. Will need OP follow up for BP control  Hyponatremia. Mild. Resolved at discharge.  Morbid obesity. BMI 72.   Procedures:  [facility]&D 5/19 [facility]  Bedside I&D 5/18 [facility]  Consultations:  Dr Dwain Sarna general surgery  Discharge Exam: Vitals:   02/24/19 0210 02/24/19 0531  BP: (!) 136/96  (!) 142/70  Pulse: 62 63  Resp: 18 16  Temp: (!) 97.3 F (36.3 C) 98 F (36.7 C)  SpO2: 96% 99%    General: awake alert no acute distress Cardiovascular: rrr no mgr no LE edema Respiratory: normal effort BS clear but distant Skin: dressing to left buttock clean and dry  Discharge Instructions   Discharge Instructions    Call MD for:  persistant dizziness or light-headedness   Complete by:  As directed    Call MD for:  temperature >100.4   Complete by:  As directed    Diet - low sodium heart healthy   Complete by:  As directed    Discharge instructions   Complete by:  As directed    Take medications as prescribed Change dressing with packing twice daily with saline soaked guaze. Change as needed with bowel movements Follow up with general surgery 1-2 weeks May shower or sitz bath   Increase activity slowly   Complete by:  As directed      Allergies as of 02/24/2019   No Known Allergies     Medication List    STOP taking these medications   enoxaparin 40 MG/0.4ML injection Commonly known as:  LOVENOX   piperacillin-tazobactam 3.375 GM/50ML IVPB Commonly known as:  ZOSYN     TAKE these medications   acetaminophen 325 MG tablet Commonly known as:  TYLENOL Take 2 tablets (650 mg total) by mouth every 6 (six) hours as needed for mild pain (or Fever >/= 101).   amLODipine 10 MG tablet Commonly known as:  NORVASC Take 1 tablet (10 mg total) by mouth daily. Start  taking on:  Feb 25, 2019   docusate sodium 100 MG capsule Commonly known as:  COLACE Take 1 capsule (100 mg total) by mouth 2 (two) times daily.   Ensure Max Protein Liqd Take 330 mLs (11 oz total) by mouth 3 (three) times daily between meals.   multivitamin with minerals Tabs tablet Take 1 tablet by mouth daily. Start taking on:  Feb 25, 2019   nicotine 14 mg/24hr patch Commonly known as:  NICODERM CQ - dosed in mg/24 hours Place 1 patch (14 mg total) onto the skin daily.   ondansetron 4 MG  tablet Commonly known as:  ZOFRAN Take 1 tablet (4 mg total) by mouth every 6 (six) hours as needed for nausea.   oxyCODONE 5 MG immediate release tablet Commonly known as:  Oxy IR/ROXICODONE Take 1 tablet (5 mg total) by mouth every 6 (six) hours as needed for severe pain.   sodium hypochlorite 0.125 % Soln Commonly known as:  DAKIN'S 1/4 STRENGTH Irrigate with as directed daily after lunch.   sulfamethoxazole-trimethoprim 800-160 MG tablet Commonly known as:  BACTRIM DS Take 1 tablet by mouth every 12 (twelve) hours for 5 days.      No Known Allergies Follow-up Information    [facility], [facility]. Go on 03/11/2019.   Specialty:  General Surgery Why:  Follow up for wound check scheduled for 8:30 AM. Please arrive 30 min prior to appointment time. Bring photo ID and insurance information.  Contact information: [address] [place] [address] [phone]            The results of significant diagnostics from this hospitalization (including imaging, microbiology, ancillary and laboratory) are listed below for reference.    Significant Diagnostic Studies: Ct Pelvis W Contrast  Result Date: 02/21/2019 CLINICAL DATA:  Left buttock abscess EXAM: CT PELVIS WITH CONTRAST TECHNIQUE: Multidetector CT imaging of the pelvis was performed using the standard protocol following the bolus administration of intravenous contrast. CONTRAST:  OMNIPAQUE IOHEXOL 300 MG/ML  SOLN COMPARISON:  None. FINDINGS: Urinary Tract:  Bladder is within normal limits. Bowel:  Visualized bowel is unremarkable. Vascular/Lymphatic: No evidence of aneurysm. Small retroperitoneal nodes, including 11 mm short axis left common iliac node (series 3/image 20). Small pelvic nodes bilaterally, including a 14 mm short axis left external iliac node (series 3/image 87), these are mildly prominent although likely reactive. Reproductive:  Prostate is unremarkable. Other:  No pelvic ascites.  Musculoskeletal: Inflammatory stranding/phlegmonous change in the medial left buttock adjacent to the gluteal cleft (series 11/image 97). No discrete fluid collection/abscess. Mild degenerative changes of the lower lumbar spine. IMPRESSION: Inflammatory stranding/phlegmonous change in the medial left buttock adjacent to the gluteal cleft, reflecting cellulitis. No discrete fluid collection/abscess. Small retroperitoneal/pelvic lymph nodes, likely reactive. Electronically Signed   By: Charline Bills M.D.   On: 02/21/2019 05:17    Microbiology: Recent Results (from the past 240 hour(s))  SARS Coronavirus 2 (CEPHEID - Performed in [facility] hospital lab), Hosp Order     Status: None   Collection Time: 02/21/19  4:34 AM  Result Value Ref Range Status   SARS Coronavirus 2 NEGATIVE NEGATIVE Final    Comment: (NOTE) If result is NEGATIVE SARS-CoV-2 target nucleic acids are NOT DETECTED. The SARS-CoV-2 RNA is generally detectable in upper and lower  respiratory specimens during the acute phase of infection. The lowest  concentration of SARS-CoV-2 viral copies this assay can detect is 250  copies / mL. A negative result does  not preclude SARS-CoV-2 infection  and should not be used as the sole basis for treatment or other  patient management decisions.  A negative result may occur with  improper specimen collection / handling, submission of specimen other  than nasopharyngeal swab, presence of viral mutation(s) within the  areas targeted by this assay, and inadequate number of viral copies  (<250 copies / mL). A negative result must be combined with clinical  observations, patient history, and epidemiological information. If result is POSITIVE SARS-CoV-2 target nucleic acids are DETECTED. The SARS-CoV-2 RNA is generally detectable in upper and lower  respiratory specimens dur ing the acute phase of infection.  Positive  results are indicative of active infection with SARS-CoV-2.  Clinical   correlation with patient history and other diagnostic information is  necessary to determine patient infection status.  Positive results do  not rule out bacterial infection or co-infection with other viruses. If result is PRESUMPTIVE POSTIVE SARS-CoV-2 nucleic acids MAY BE PRESENT.   A presumptive positive result was obtained on the submitted specimen  and confirmed on repeat testing.  While 2019 novel coronavirus  (SARS-CoV-2) nucleic acids may be present in the submitted sample  additional confirmatory testing may be necessary for epidemiological  and / or clinical management purposes  to differentiate between  SARS-CoV-2 and other Sarbecovirus currently known to infect humans.  If clinically indicated additional testing with an alternate test  methodology [phone] is advised. The SARS-CoV-2 RNA is generally  detectable in upper and lower respiratory sp ecimens during the acute  phase of infection. The expected result is Negative. Fact Sheet for Patients:  BoilerBrush.com.cyhttps://www.fda.gov/media/136312/download Fact Sheet for Healthcare Providers: https://pope.com/https://www.fda.gov/media/136313/download This test is not yet approved or cleared by the [place] FDA and has been authorized for detection and/or diagnosis of SARS-CoV-2 by FDA under an Emergency Use Authorization (EUA).  This EUA will remain in effect (meaning this test can be used) for the duration of the COVID-19 declaration under Section 564(b)(1) of the Act, 21 U.S.C. section 360bbb-3(b)(1), unless the authorization is terminated or revoked sooner. Performed at [facility], [address] [place], [place] [address]   Blood culture (routine x 2)     Status: None (Preliminary result)   Collection Time: 02/21/19  4:35 AM  Result Value Ref Range Status   Specimen Description BLOOD RIGHT ANTECUBITAL  Final   Special Requests   Final    BOTTLES DRAWN AEROBIC AND ANAEROBIC Blood Culture adequate volume   Culture   Final    NO  GROWTH 3 DAYS Performed at [facility], [address] [place], [place] [address]    Report Status PENDING  Incomplete  Blood culture (routine x 2)     Status: None (Preliminary result)   Collection Time: 02/21/19  4:35 AM  Result Value Ref Range Status   Specimen Description BLOOD LEFT ANTECUBITAL  Final   Special Requests   Final    BOTTLES DRAWN AEROBIC AND ANAEROBIC Blood Culture results may not be optimal due to an excessive volume of blood received in culture bottles   Culture   Final    NO GROWTH 3 DAYS Performed at [facility], [address] [place], [place] [address]    Report Status PENDING  Incomplete  Surgical pcr screen     Status: Abnormal   Collection Time: 02/23/19 10:22 AM  Result Value Ref Range Status   MRSA, PCR POSITIVE (A) NEGATIVE Final    Comment: CRITICAL RESULT CALLED TO, READ  BACK BY AND VERIFIED WITH: J. TORRES, RN AT 1240 ON 02/23/19 BY C. JESSUP, MLT.    Staphylococcus aureus POSITIVE (A) NEGATIVE Final    Comment: (NOTE) The Xpert SA Assay (FDA approved for NASAL specimens in patients 55 years of age and older), is one component of a comprehensive surveillance program. It is not intended to diagnose infection nor to guide or monitor treatment. Performed at [facility], [address], [place], [place] [address]      Labs: Basic Metabolic Panel: Recent Labs  Lab 02/20/19 2346 02/22/19 0510 02/23/19 0133 02/24/19 0459  NA 136  --  137 136  K 3.5  --  3.4* 4.2  CL 102  --  103 102  CO2 26  --  25 22  GLUCOSE 144*  --  99 120*  BUN 16  --  7 7  CREATININE 0.57* 0.55* 0.49* 0.52*  CALCIUM 8.5*  --  8.3* 8.9   Liver Function Tests: No results for input(s): AST, ALT, ALKPHOS, BILITOT, PROT, ALBUMIN in the last 168 hours. No results for input(s): LIPASE, AMYLASE in the last 168 hours. No results for input(s): AMMONIA in the last 168 hours. CBC: Recent Labs  Lab 02/20/19 2346 02/22/19 0510 02/23/19 0133  02/24/19 0459  WBC 15.4* 10.6* 9.1 11.0*  NEUTROABS 10.9*  --   --   --   HGB 14.2 13.4 13.5 14.8  HCT 43.4 41.3 41.1 44.2  MCV 85.4 86.8 84.7 83.7  PLT 332 305 329 395   Cardiac Enzymes: No results for input(s): CKTOTAL, CKMB, CKMBINDEX, TROPONINI in the last 168 hours. BNP: BNP (last 3 results) No results for input(s): BNP in the last 8760 hours.  ProBNP (last 3 results) No results for input(s): PROBNP in the last 8760 hours.  CBG: No results for input(s): GLUCAP in the last 168 hours.     Signed:  Gwenyth Bender NP  [facility] 02/24/2019, 10:08 AM   Patient was seen, examined,treatment plan was discussed with the Advance Practice Provider.  I have personally reviewed the clinical findings, labs, EKG, imaging studies and management of this patient in detail. I have also reviewed the orders written for this patient which were under my direction. I agree with the documentation, as recorded by the Advance Practice Provider.   Christopher Keith is a 33 y.o. male sent from [place] with need for abscess drainage in the OR.  Plan to treat as MRSA as no culture done.  Will need to establish with PCP.  Wound care has been explained and patient has done in the past.     Joseph Art, DO  How to contact the [facility] Attending or Consulting provider 7A - 7P or covering provider during after hours 7P -7A, for this patient?  1. Check the care team in [facility] and look for a) attending/consulting [facility] provider listed and b) the [facility] team listed 2. Log into [email or web address] and use [place]'s universal password to access. If you do not have the password, please contact the hospital operator. 3. Locate the [facility] provider you are looking for under [facility] and page to a number that you can be directly reached. 4. If you still have difficulty reaching the provider, please page the [facility] (Director on Call) for the Hospitalists listed on amion for assistance.

## 2019-02-26 LAB — CULTURE, BLOOD (ROUTINE X 2)
Culture: NO GROWTH
Culture: NO GROWTH
Special Requests: ADEQUATE

## 2020-07-19 ENCOUNTER — Other Ambulatory Visit: Payer: Self-pay

## 2020-07-19 ENCOUNTER — Observation Stay
Admission: EM | Admit: 2020-07-19 | Discharge: 2020-07-21 | Disposition: A | Discharge status: Home / Self Care | Payer: Self-pay | Attending: Internal Medicine | Admitting: Internal Medicine

## 2020-07-19 ENCOUNTER — Emergency Department: Payer: Self-pay

## 2020-07-19 DIAGNOSIS — J210 Acute bronchiolitis due to respiratory syncytial virus: Secondary | ICD-10-CM

## 2020-07-19 DIAGNOSIS — F17210 Nicotine dependence, cigarettes, uncomplicated: Secondary | ICD-10-CM | POA: Insufficient documentation

## 2020-07-19 DIAGNOSIS — Z79899 Other long term (current) drug therapy: Secondary | ICD-10-CM | POA: Insufficient documentation

## 2020-07-19 DIAGNOSIS — J205 Acute bronchitis due to respiratory syncytial virus: Principal | ICD-10-CM | POA: Insufficient documentation

## 2020-07-19 DIAGNOSIS — I10 Essential (primary) hypertension: Secondary | ICD-10-CM | POA: Insufficient documentation

## 2020-07-19 DIAGNOSIS — J209 Acute bronchitis, unspecified: Secondary | ICD-10-CM | POA: Diagnosis present

## 2020-07-19 DIAGNOSIS — J9601 Acute respiratory failure with hypoxia: Secondary | ICD-10-CM

## 2020-07-19 DIAGNOSIS — R0902 Hypoxemia: Secondary | ICD-10-CM

## 2020-07-19 DIAGNOSIS — Z20822 Contact with and (suspected) exposure to covid-19: Secondary | ICD-10-CM | POA: Insufficient documentation

## 2020-07-19 LAB — COMPREHENSIVE METABOLIC PANEL
ALT: 29 U/L (ref 0–44)
AST: 25 U/L (ref 15–41)
Albumin: 3.9 g/dL (ref 3.5–5.0)
Alkaline Phosphatase: 77 U/L (ref 38–126)
Anion gap: 11 (ref 5–15)
BUN: 9 mg/dL (ref 6–20)
CO2: 25 mmol/L (ref 22–32)
Calcium: 8.7 mg/dL — ABNORMAL LOW (ref 8.9–10.3)
Chloride: 101 mmol/L (ref 98–111)
Creatinine, Ser: 0.55 mg/dL — ABNORMAL LOW (ref 0.61–1.24)
GFR, Estimated: >60 mL/min (ref >60)
Glucose, Bld: 100 mg/dL — ABNORMAL HIGH (ref 70–99)
Potassium: 3.5 mmol/L (ref 3.5–5.1)
Sodium: 137 mmol/L (ref 135–145)
Total Bilirubin: 1.3 mg/dL — ABNORMAL HIGH (ref 0.3–1.2)
Total Protein: 8.1 g/dL (ref 6.5–8.1)

## 2020-07-19 LAB — CBC WITH DIFFERENTIAL/PLATELET
Abs Immature Granulocytes: 0.01 10*3/uL (ref 0.00–0.07)
Basophils Absolute: 0 10*3/uL (ref 0.0–0.1)
Basophils Relative: 1 %
Eosinophils Absolute: 0.1 10*3/uL (ref 0.0–0.5)
Eosinophils Relative: 2 %
HCT: 49.3 % (ref 39.0–52.0)
Hemoglobin: 16.4 g/dL (ref 13.0–17.0)
Immature Granulocytes: 0 %
Lymphocytes Relative: 17 %
Lymphs Abs: 0.9 10*3/uL (ref 0.7–4.0)
MCH: 28.5 pg (ref 26.0–34.0)
MCHC: 33.3 g/dL (ref 30.0–36.0)
MCV: 85.6 fL (ref 80.0–100.0)
Monocytes Absolute: 0.9 10*3/uL (ref 0.1–1.0)
Monocytes Relative: 16 %
Neutro Abs: 3.5 10*3/uL (ref 1.7–7.7)
Neutrophils Relative %: 64 %
Platelets: 237 10*3/uL (ref 150–400)
RBC: 5.76 MIL/uL (ref 4.22–5.81)
RDW: 13.1 % (ref 11.5–15.5)
WBC: 5.4 10*3/uL (ref 4.0–10.5)
nRBC: 0 % (ref 0.0–0.2)

## 2020-07-19 LAB — BRAIN NATRIURETIC PEPTIDE: B Natriuretic Peptide: 20 pg/mL (ref 0.0–100.0)

## 2020-07-19 MED ORDER — IPRATROPIUM-ALBUTEROL 0.5-2.5 (3) MG/3ML IN SOLN
3.0000 mL | Freq: Once | RESPIRATORY_TRACT | Status: AC
  Administered 2020-07-20: 3 mL via RESPIRATORY_TRACT

## 2020-07-19 MED ORDER — METHYLPREDNISOLONE SODIUM SUCC 125 MG IJ SOLR
125.0000 mg | Freq: Once | INTRAMUSCULAR | Status: AC
  Administered 2020-07-20: 125 mg via INTRAVENOUS
  Filled 2020-07-19: qty 2

## 2020-07-19 MED ORDER — IPRATROPIUM-ALBUTEROL 0.5-2.5 (3) MG/3ML IN SOLN
3.0000 mL | Freq: Once | RESPIRATORY_TRACT | Status: AC
  Administered 2020-07-20: 3 mL via RESPIRATORY_TRACT
  Filled 2020-07-19: qty 9

## 2020-07-19 NOTE — ED Triage Notes (Signed)
PAtient presents with SOB, cough, chills, diarrhea, and fatigue. SOB worse with exertion

## 2020-07-20 ENCOUNTER — Other Ambulatory Visit: Payer: Self-pay

## 2020-07-20 ENCOUNTER — Encounter: Payer: Self-pay | Admitting: Internal Medicine

## 2020-07-20 DIAGNOSIS — J209 Acute bronchitis, unspecified: Secondary | ICD-10-CM | POA: Diagnosis present

## 2020-07-20 DIAGNOSIS — R0902 Hypoxemia: Secondary | ICD-10-CM

## 2020-07-20 DIAGNOSIS — J205 Acute bronchitis due to respiratory syncytial virus: Secondary | ICD-10-CM

## 2020-07-20 LAB — TROPONIN I (HIGH SENSITIVITY)
Troponin I (High Sensitivity): 6 ng/L (ref <18)
Troponin I (High Sensitivity): 6 ng/L (ref <18)

## 2020-07-20 LAB — RESP PANEL BY RT PCR (RSV, FLU A&B, COVID)
Influenza A by PCR: NEGATIVE
Influenza B by PCR: NEGATIVE
Respiratory Syncytial Virus by PCR: POSITIVE — AB
SARS Coronavirus 2 by RT PCR: NEGATIVE

## 2020-07-20 LAB — FIBRIN DERIVATIVES D-DIMER (ARMC ONLY): Fibrin derivatives D-dimer (ARMC): 497.89 ng/mL (FEU) (ref 0.00–499.00)

## 2020-07-20 MED ORDER — AMLODIPINE BESYLATE 5 MG PO TABS
5.0000 mg | ORAL_TABLET | Freq: Every day | ORAL | Status: DC
  Administered 2020-07-20 (×2): 5 mg via ORAL
  Filled 2020-07-20 (×3): qty 1

## 2020-07-20 MED ORDER — ENOXAPARIN SODIUM 100 MG/ML ~~LOC~~ SOLN
100.0000 mg | SUBCUTANEOUS | Status: DC
  Administered 2020-07-21: 100 mg via SUBCUTANEOUS
  Filled 2020-07-20 (×3): qty 1

## 2020-07-20 MED ORDER — ALBUTEROL SULFATE (2.5 MG/3ML) 0.083% IN NEBU
2.5000 mg | INHALATION_SOLUTION | RESPIRATORY_TRACT | Status: DC | PRN
  Administered 2020-07-20: 2.5 mg via RESPIRATORY_TRACT
  Filled 2020-07-20 (×2): qty 3

## 2020-07-20 MED ORDER — IPRATROPIUM-ALBUTEROL 0.5-2.5 (3) MG/3ML IN SOLN
3.0000 mL | Freq: Four times a day (QID) | RESPIRATORY_TRACT | Status: DC
  Administered 2020-07-20 – 2020-07-21 (×6): 3 mL via RESPIRATORY_TRACT
  Filled 2020-07-20 (×6): qty 3

## 2020-07-20 NOTE — ED Notes (Signed)
Pt reports nephews were sick last week and he was around them, reports that symptoms began a couple days later with progressively increasing shortness of breath. Pt reports tonight his breathing became difficult enough for him to want to come in

## 2020-07-20 NOTE — ED Notes (Signed)
Pt given meal tray and lights dimmed for comfort

## 2020-07-20 NOTE — ED Notes (Signed)
Unsuccessful IV start attempt 

## 2020-07-20 NOTE — ED Notes (Signed)
Second RN also attempted IV start

## 2020-07-20 NOTE — Progress Notes (Signed)
Patient ID: Christopher Keith, male   DOB: 01-12-1986, 34 y.o.   MRN: [id number] This is a no charge progress note as patient was admitted this a.m. H&P reviewed patient seen and examined. Christopher Keith is a 34 y.o. male with medical history significant for hypertension and morbid obesity as well as nicotine dependence until 2 months ago, who presents with a several day history of shortness of breath, cough productive of clear phlegm, nasal congestion, chills without fever and wheezing.  He is unvaccinated against Covid and is not interested in getting the vaccine when I asked him.  He is Covid negative but positive for RSV.will continue supportive care. 02 support as needed.

## 2020-07-20 NOTE — ED Provider Notes (Signed)
[facility] Emergency Department Provider Note  ____________________________________________  Time seen: Approximately 12:14 AM  I have reviewed the triage vital signs and the nursing notes.   HISTORY  Chief Complaint Shortness of Breath, Nasal Congestion, Chills, Cough, and Diarrhea   HPI Christopher Keith is a 34 y.o. male with a history of obesity and hypertension who presents for evaluation of shortness of breath.  Patient reports that he has been sick for 2 days with cough productive of clear phlegm, shortness of breath, nasal congestion, chills, and diarrhea.  He is unvaccinated against Covid.  No known exposures.  Denies fever, chest pain, vomiting, abdominal pain.  No shortness of breath is absent at rest but becomes moderate in intensity with ambulation.  He also experienced wheezing with ambulation.  Patient denies history of asthma COPD.  He is a smoker however endorsed that he stopped smoking 2 months ago.  No personal or family history of PE or DVT, no recent travel immobilization, no leg pain or swelling, no hemoptysis or exogenous hormones.   Past Medical History:  Diagnosis Date  . Hypertension   . MRSA carrier   . Obesity     Patient Active Problem List   Diagnosis Date Noted  . Obesity, morbid (HCC) 02/23/2019  . MRSA carrier   . Pressure injury of skin 02/22/2019  . Hypertension 02/22/2019  . Cellulitis of left buttock 02/21/2019    Past Surgical History:  Procedure Laterality Date  . ADENOIDECTOMY    . IRRIGATION AND DEBRIDEMENT ABSCESS Right 02/23/2019   Procedure: IRRIGATION AND DEBRIDEMENT PERI RECTAL ABSCESS;  Surgeon: Emelia Loron, MD;  Location: [facility] OR;  Service: General;  Laterality: Right;  . TONSILLECTOMY      Prior to Admission medications   Medication Sig Start Date End Date Taking? Authorizing Provider  acetaminophen (TYLENOL) 325 MG tablet Take 2 tablets (650 mg total) by mouth every 6 (six) hours as needed for mild  pain (or Fever >/= 101). 02/22/19   Gouru, Deanna Artis, MD  amLODipine (NORVASC) 10 MG tablet Take 1 tablet (10 mg total) by mouth daily. 02/25/19   Black, Lesle Chris, NP  docusate sodium (COLACE) 100 MG capsule Take 1 capsule (100 mg total) by mouth 2 (two) times daily. 02/22/19   Ramonita Lab, MD  Ensure Max Protein (ENSURE MAX PROTEIN) LIQD Take 330 mLs (11 oz total) by mouth 3 (three) times daily between meals. 02/24/19   Black, Lesle Chris, NP  Multiple Vitamin (MULTIVITAMIN WITH MINERALS) TABS tablet Take 1 tablet by mouth daily. 02/25/19   Black, Lesle Chris, NP  nicotine (NICODERM CQ - DOSED IN MG/24 HOURS) 14 mg/24hr patch Place 1 patch (14 mg total) onto the skin daily. 02/23/19   Gouru, Deanna Artis, MD  ondansetron (ZOFRAN) 4 MG tablet Take 1 tablet (4 mg total) by mouth every 6 (six) hours as needed for nausea. 02/22/19   Gouru, Deanna Artis, MD  oxyCODONE (OXY IR/ROXICODONE) 5 MG immediate release tablet Take 1 tablet (5 mg total) by mouth every 6 (six) hours as needed for severe pain. 02/22/19   Gouru, Deanna Artis, MD  sodium hypochlorite (DAKIN'S 1/4 STRENGTH) 0.125 % SOLN Irrigate with as directed daily after lunch. 02/23/19   Ramonita Lab, MD    Allergies Patient has no known allergies.  Family History  Problem Relation Age of Onset  . Gastric cancer Mother   . Alcohol abuse Father   . Obesity Father   . Diabetes Mellitus II Brother     Social History  Social History   Tobacco Use  . Smoking status: Current Some Day Smoker    Types: Cigarettes  . Smokeless tobacco: Never Used  Substance Use Topics  . Alcohol use: Yes  . Drug use: Never    Review of Systems  Constitutional: Negative for fever. + chills Eyes: Negative for visual changes. ENT: Negative for sore throat. + Congestion Neck: No neck pain  Cardiovascular: Negative for chest pain. Respiratory: + shortness of breath, wheezing, cough Gastrointestinal: Negative for abdominal pain, vomiting + diarrhea. Genitourinary: Negative for  dysuria. Musculoskeletal: Negative for back pain. Skin: Negative for rash. Neurological: Negative for headaches, weakness or numbness. Psych: No SI or HI  ____________________________________________   PHYSICAL EXAM:  VITAL SIGNS: ED Triage Vitals  Enc Vitals Group     BP 07/19/20 1836 (!) 185/110     Pulse Rate 07/19/20 1836 82     Resp 07/19/20 1836 (!) 30     Temp 07/19/20 1836 98.7 F (37.1 C)     Temp Source 07/19/20 1836 Oral     SpO2 07/19/20 1836 93 %     Weight 07/19/20 1837 (!) 426 lb 6.4 oz (193.4 kg)     Height 07/19/20 1837 5' 7.25" (1.708 m)     Head Circumference --      Peak Flow --      Pain Score 07/19/20 1837 3     Pain Loc --      Pain Edu? --      Excl. in GC? --     Constitutional: Alert and oriented. Well appearing and in no apparent distress. HEENT:      Head: Normocephalic and atraumatic.         Eyes: Conjunctivae are normal. Sclera is non-icteric.       Mouth/Throat: Mucous membranes are moist.       Neck: Supple with no signs of meningismus. Cardiovascular: Regular rate and rhythm. No murmurs, gallops, or rubs. 2+ symmetrical distal pulses are present in all extremities. No JVD. Respiratory: Normal respiratory effort at rest, increased work of breathing, diaphoresis, tachypnea with respiratory rate in the 30s and hypoxia with sats in 88% with ambulation.  Faint wheezes bilaterally with decreased air movement.  Gastrointestinal: Soft, non tender, and non distended. Musculoskeletal:  No edema, cyanosis, or erythema of extremities. Neurologic: Normal speech and language. Face is symmetric. Moving all extremities. No gross focal neurologic deficits are appreciated. Skin: Skin is warm, dry and intact. No rash noted. Psychiatric: Mood and affect are normal. Speech and behavior are normal.  ____________________________________________   LABS (all labs ordered are listed, but only abnormal results are displayed)  Labs Reviewed  RESP PANEL BY RT  PCR (RSV, FLU A&B, COVID) - Abnormal; Notable for the following components:      Result Value   Respiratory Syncytial Virus by PCR POSITIVE (*)    All other components within normal limits  COMPREHENSIVE METABOLIC PANEL - Abnormal; Notable for the following components:   Glucose, Bld 100 (*)    Creatinine, Ser 0.55 (*)    Calcium 8.7 (*)    Total Bilirubin 1.3 (*)    All other components within normal limits  CBC WITH DIFFERENTIAL/PLATELET  BRAIN NATRIURETIC PEPTIDE  FIBRIN DERIVATIVES D-DIMER ([facility] ONLY)  TROPONIN I (HIGH SENSITIVITY)  TROPONIN I (HIGH SENSITIVITY)   ____________________________________________  EKG  ED ECG REPORT I, Nita Sickle, the attending physician, personally viewed and interpreted this ECG.  Normal sinus rhythm, rate of 81, right bundle branch block,  normal QTC, normal axis, anterior Q waves with no ST elevation.  Unchanged from prior. ____________________________________________  RADIOLOGY  I have personally reviewed the images performed during this visit and I agree with the Radiologist's read.   Interpretation by Radiologist:  DG Chest 2 View  Result Date: 07/19/2020 CLINICAL DATA:  Shortness of breath; cough EXAM: CHEST - 2 VIEW COMPARISON:  October 21, 2013 FINDINGS: There is no edema or airspace opacity. Heart is upper normal in size with pulmonary vascularity normal. No appreciable adenopathy. There is slight degenerative change in the thoracic spine. IMPRESSION: Lungs clear.  Heart upper normal in size.  No adenopathy evident. Electronically Signed   By: Bretta Bang III M.D.   On: 07/19/2020 19:10     ____________________________________________   PROCEDURES  Procedure(s) performed:yes .1-3 Lead EKG Interpretation Performed by: Nita Sickle, MD Authorized by: Nita Sickle, MD     Interpretation: non-specific     ECG rate assessment: normal     Rhythm: sinus rhythm     Ectopy: none     Critical Care  performed: yes  CRITICAL CARE Performed by: Nita Sickle  ?  Total critical care time: 35 min  Critical care time was exclusive of separately billable procedures and treating other patients.  Critical care was necessary to treat or prevent imminent or life-threatening deterioration.  Critical care was time spent personally by me on the following activities: development of treatment plan with patient and/or surrogate as well as nursing, discussions with consultants, evaluation of patient's response to treatment, examination of patient, obtaining history from patient or surrogate, ordering and performing treatments and interventions, ordering and review of laboratory studies, ordering and review of radiographic studies, pulse oximetry and re-evaluation of patient's condition.  ____________________________________________   INITIAL IMPRESSION / ASSESSMENT AND PLAN / ED COURSE  34 y.o. male with a history of obesity and hypertension who presents for evaluation of shortness of breath, chills, congestion, productive cough, and diarrhea x2 days.  Patient is unvaccinated.  He has normal work of breathing and normal sats at rest however with ambulation in the room patient desatted to 88%, became tachypneic with respiratory rate in the low 30s and diaphoretic.  Denies any chest pain.  EKG shows no acute ischemic changes and is unchanged from baseline.  Patient was placed on 2 L nasal cannula.  He does have faint expiratory wheezes bilaterally with decreased air movement.  With a history of smoking we will treat with duo nebs and steroids.  Will check for Covid, RSV and flu.  Chest x-ray visualized by me with no evidence of edema, pneumonia, pneumothorax, confirmed by radiology.  Labs show no leukocytosis.  Normal BNP.  Troponin x2 - and no signs of ischemia or myocarditis.  Ddx covid, flu, pna, bronchitis, copd, pericarditis, myocarditis, PE  Old medical records reviewed.  Patient placed on  telemetry for close monitoring.  _________________________ 1:27 AM on 07/20/2020 -----------------------------------------  D-dimer negative.  Covid negative, flu negative but positive for RSV.  With new oxygen requirement and acute respiratory failure hypoxia will admit to the hospitalist service.      _____________________________________________ Please note:  Patient was evaluated in Emergency Department today for the symptoms described in the history of present illness. Patient was evaluated in the context of the global COVID-19 pandemic, which necessitated consideration that the patient might be at risk for infection with the SARS-CoV-2 virus that causes COVID-19. Institutional protocols and algorithms that pertain to the evaluation of patients at risk for COVID-19 are  in a state of rapid change based on information released by regulatory bodies including the CDC and federal and state organizations. These policies and algorithms were followed during the patient's care in the ED.  Some ED evaluations and interventions may be delayed as a result of limited staffing during the pandemic.   [place] Controlled Substance Database was reviewed by me. ____________________________________________   FINAL CLINICAL IMPRESSION(S) / ED DIAGNOSES   Final diagnoses:  Acute respiratory failure with hypoxia (HCC)  RSV (acute bronchiolitis due to respiratory syncytial virus)      NEW MEDICATIONS STARTED DURING THIS VISIT:  ED Discharge Orders    None       Note:  This document was prepared using Dragon voice recognition software and may include unintentional dictation errors.    Don PerkingVeronese, [place], MD 07/20/20 [phone]

## 2020-07-20 NOTE — ED Notes (Signed)
Additional RN able to land IV

## 2020-07-20 NOTE — H&P (Signed)
History and Physical    Christopher Keith OMV:[id number] DOB: Nov 24, 1985 DOA: 07/19/2020  PCP: Patient, No Pcp Per   Patient coming from: Home  I have personally briefly reviewed patient's old medical records in [facility]  Chief Complaint: Shortness of breath  HPI: Christopher Keith is a 34 y.o. male with medical history significant for hypertension and morbid obesity as well as nicotine dependence until 2 months ago, who presents with a several day history of shortness of breath, cough productive of clear phlegm, nasal congestion, chills without fever and wheezing.  He is unvaccinated against Covid.  Denies nausea, vomiting or diarrhea and denies chest pain. ED Course: On arrival in the emergency room he was tachypneic at 30 with O2 sat 93% on room air pulse 82 BP 185/110 and afebrile.  O2 sat reportedly dipped to 87 with ambulation.  Blood work mostly unremarkable.  D-dimer within normal limits BNP normal troponin normal.  Covid and flu negative but RSV positive EKG as reviewed by me : NSR with RBBB rate of 81.  No acute ST-T wave changes Chest x-ray showed clear lungs Patient was treated with nebulizer in the emergency room but continued to wheeze and have increased work of breathing.  Hospitalist consulted for admission. Review of Systems: As per HPI otherwise all other systems on review of systems negative.    Past Medical History:  Diagnosis Date  . Hypertension   . MRSA carrier   . Obesity     Past Surgical History:  Procedure Laterality Date  . ADENOIDECTOMY    . IRRIGATION AND DEBRIDEMENT ABSCESS Right 02/23/2019   Procedure: IRRIGATION AND DEBRIDEMENT PERI RECTAL ABSCESS;  Surgeon: Emelia Loron, MD;  Location: [facility] OR;  Service: General;  Laterality: Right;  . TONSILLECTOMY       reports that he has been smoking cigarettes. He has never used smokeless tobacco. He reports current alcohol use. He reports that he does not use drugs.  No Known Allergies  Family History    Problem Relation Age of Onset  . Gastric cancer Mother   . Alcohol abuse Father   . Obesity Father   . Diabetes Mellitus II Brother       Prior to Admission medications   Medication Sig Start Date End Date Taking? Authorizing Provider  acetaminophen (TYLENOL) 325 MG tablet Take 2 tablets (650 mg total) by mouth every 6 (six) hours as needed for mild pain (or Fever >/= 101). 02/22/19   Gouru, Deanna Artis, MD  amLODipine (NORVASC) 10 MG tablet Take 1 tablet (10 mg total) by mouth daily. 02/25/19   Black, Lesle Chris, NP  docusate sodium (COLACE) 100 MG capsule Take 1 capsule (100 mg total) by mouth 2 (two) times daily. 02/22/19   Ramonita Lab, MD  Ensure Max Protein (ENSURE MAX PROTEIN) LIQD Take 330 mLs (11 oz total) by mouth 3 (three) times daily between meals. 02/24/19   Black, Lesle Chris, NP  Multiple Vitamin (MULTIVITAMIN WITH MINERALS) TABS tablet Take 1 tablet by mouth daily. 02/25/19   Black, Lesle Chris, NP  nicotine (NICODERM CQ - DOSED IN MG/24 HOURS) 14 mg/24hr patch Place 1 patch (14 mg total) onto the skin daily. 02/23/19   Gouru, Deanna Artis, MD  ondansetron (ZOFRAN) 4 MG tablet Take 1 tablet (4 mg total) by mouth every 6 (six) hours as needed for nausea. 02/22/19   Gouru, Deanna Artis, MD  oxyCODONE (OXY IR/ROXICODONE) 5 MG immediate release tablet Take 1 tablet (5 mg total) by mouth every 6 (six) hours  as needed for severe pain. 02/22/19   Gouru, Deanna Artis, MD  sodium hypochlorite (DAKIN'S 1/4 STRENGTH) 0.125 % SOLN Irrigate with as directed daily after lunch. 02/23/19   Ramonita Lab, MD    Physical Exam: Vitals:   07/19/20 2345 07/20/20 0015 07/20/20 0017 07/20/20 0047  BP:    (!) 183/88  Pulse: 96 66  78  Resp:      Temp:      TempSrc:      SpO2: 99% 97% 98% 96%  Weight:      Height:         Vitals:   07/19/20 2345 07/20/20 0015 07/20/20 0017 07/20/20 0047  BP:    (!) 183/88  Pulse: 96 66  78  Resp:      Temp:      TempSrc:      SpO2: 99% 97% 98% 96%  Weight:      Height:           Constitutional: Alert and oriented x 3 .  Mild conversational dyspnea HEENT:      Head: Normocephalic and atraumatic.         Eyes: PERLA, EOMI, Conjunctivae are normal. Sclera is non-icteric.       Mouth/Throat: Mucous membranes are moist.       Neck: Supple with no signs of meningismus. Cardiovascular: Regular rate and rhythm. No murmurs, gallops, or rubs. 2+ symmetrical distal pulses are present . No JVD. No LE edema Respiratory: Respiratory effort increased with tachypnea, breath sounds diminished bilaterally, scattered bibasilar wheezes and rhonchi,  Gastrointestinal: Soft, non tender, and non distended with positive bowel sounds. No rebound or guarding. Genitourinary: No CVA tenderness. Musculoskeletal: Nontender with normal range of motion in all extremities. No cyanosis, or erythema of extremities. Neurologic:  Face is symmetric. Moving all extremities. No gross focal neurologic deficits . Skin: Skin is warm, dry.  No rash or ulcers Psychiatric: Mood and affect are normal    Labs on Admission: I have personally reviewed following labs and imaging studies  CBC: Recent Labs  Lab 07/19/20 1859  WBC 5.4  NEUTROABS 3.5  HGB 16.4  HCT 49.3  MCV 85.6  PLT 237   Basic Metabolic Panel: Recent Labs  Lab 07/19/20 1859  NA 137  K 3.5  CL 101  CO2 25  GLUCOSE 100*  BUN 9  CREATININE 0.55*  CALCIUM 8.7*   GFR: Estimated Creatinine Clearance: 212 mL/min (A) (by C-G formula based on SCr of 0.55 mg/dL (L)). Liver Function Tests: Recent Labs  Lab 07/19/20 1859  AST 25  ALT 29  ALKPHOS 77  BILITOT 1.3*  PROT 8.1  ALBUMIN 3.9   No results for input(s): LIPASE, AMYLASE in the last 168 hours. No results for input(s): AMMONIA in the last 168 hours. Coagulation Profile: No results for input(s): INR, PROTIME in the last 168 hours. Cardiac Enzymes: No results for input(s): CKTOTAL, CKMB, CKMBINDEX, TROPONINI in the last 168 hours. BNP (last 3 results) No results  for input(s): PROBNP in the last 8760 hours. HbA1C: No results for input(s): HGBA1C in the last 72 hours. CBG: No results for input(s): GLUCAP in the last 168 hours. Lipid Profile: No results for input(s): CHOL, HDL, LDLCALC, TRIG, CHOLHDL, LDLDIRECT in the last 72 hours. Thyroid Function Tests: No results for input(s): TSH, T4TOTAL, FREET4, T3FREE, THYROIDAB in the last 72 hours. Anemia Panel: No results for input(s): VITAMINB12, FOLATE, FERRITIN, TIBC, IRON, RETICCTPCT in the last 72 hours. Urine analysis: No results found for:  COLORURINE, APPEARANCEUR, LABSPEC, PHURINE, GLUCOSEU, HGBUR, BILIRUBINUR, KETONESUR, PROTEINUR, UROBILINOGEN, NITRITE, LEUKOCYTESUR  Radiological Exams on Admission: DG Chest 2 View  Result Date: 07/19/2020 CLINICAL DATA:  Shortness of breath; cough EXAM: CHEST - 2 VIEW COMPARISON:  October 21, 2013 FINDINGS: There is no edema or airspace opacity. Heart is upper normal in size with pulmonary vascularity normal. No appreciable adenopathy. There is slight degenerative change in the thoracic spine. IMPRESSION: Lungs clear.  Heart upper normal in size.  No adenopathy evident. Electronically Signed   By: Bretta Bang III M.D.   On: 07/19/2020 19:10     Assessment/Plan 34 year old male with history of hypertension and morbid obesity as well as nicotine dependence until 2 months ago, presenting with shortness of breath, cough productive of clear phlegm, nasal congestion, and wheezing.  RSV positive, flu and Covid negative    Acute bronchitis due to respiratory syncytial virus (RSV)   Hypoxia -Scheduled and as needed bronchodilator treatments -Oxygen as needed.  O2 sats 88% with ambulation -IV fluids, antitussives -Supportive care    Hypertension -Amlodipine 10 daily    Morbid obesity with BMI of 60.0-69.9, adult (HCC) -Complicates overall prognosis and care   DVT prophylaxis: Lovenox  Code Status: full code  Family Communication:  none  Disposition  Plan: Back to previous home environment Consults called: none  Status: Observation    Andris Baumann MD [facility]     07/20/2020, 1:39 AM

## 2020-07-20 NOTE — Progress Notes (Signed)
PHARMACIST - PHYSICIAN COMMUNICATION  CONCERNING:  Enoxaparin (Lovenox) for DVT Prophylaxis    RECOMMENDATION: Patient was prescribed enoxaprin 40mg  q24 hours for VTE prophylaxis.   Filed Weights   07/19/20 1837  Weight: (!) 193.4 kg (426 lb 6.4 oz)    Body mass index is 66.29 kg/m.  Estimated Creatinine Clearance: 212 mL/min (A) (by C-G formula based on SCr of 0.55 mg/dL (L)).   Based on [facility] policy patient is candidate for enoxaparin 0.5mg /kg TBW SQ every 24 hours based on BMI being >30.  DESCRIPTION: Pharmacy has adjusted enoxaparin dose per [facility] policy.  Patient is now receiving enoxaparin _100___mg every _24__ hours    [facility], PharmD Clinical Pharmacist  07/20/2020 3:57 AM

## 2020-07-20 NOTE — ED Notes (Signed)
This RN gave report to [employer] on floor 1C

## 2020-07-21 LAB — MRSA PCR SCREENING: MRSA by PCR: NEGATIVE

## 2020-07-21 MED ORDER — AMLODIPINE BESYLATE 10 MG PO TABS
10.0000 mg | ORAL_TABLET | Freq: Every day | ORAL | Status: DC
  Administered 2020-07-21: 09:00:00 10 mg via ORAL
  Filled 2020-07-21: qty 1

## 2020-07-21 MED ORDER — AMLODIPINE BESYLATE 10 MG PO TABS
10.0000 mg | ORAL_TABLET | Freq: Every day | ORAL | 1 refills | Status: DC
Start: 2020-07-21 — End: 2022-08-13

## 2020-07-21 MED ORDER — ALBUTEROL SULFATE HFA 108 (90 BASE) MCG/ACT IN AERS: 2.0000 | INHALATION_SPRAY | Freq: Four times a day (QID) | RESPIRATORY_TRACT | 0 refills | Status: DC | PRN

## 2020-07-21 MED ORDER — AMLODIPINE BESYLATE 10 MG PO TABS: 10.0000 mg | ORAL_TABLET | Freq: Every day | ORAL | Status: DC

## 2020-07-21 MED ORDER — PREDNISONE 20 MG PO TABS: 20.0000 mg | ORAL_TABLET | Freq: Every day | ORAL | 0 refills | Status: DC

## 2020-07-21 NOTE — Discharge Summary (Addendum)
Christopher Keith OJJ:[id number] DOB: 1986/02/24 DOA: 07/19/2020  PCP: Patient, No Pcp Per  Admit date: 07/19/2020 Discharge date: 07/21/2020  Admitted From: home Disposition:  home  Recommendations for Outpatient Follow-up:  1. Follow up with PCP in 1 week 2. Please obtain BMP/CBC in one week      Discharge Condition:Stable CODE STATUS: Full Diet recommendation: Heart Healthy  Brief/Interim Summary: Christopher Keith is a 34 y.o. male with medical history significant for hypertension and morbid obesity as well as nicotine dependence until 2 months ago, who presents with a several day history of shortness of breath, cough productive of clear phlegm, nasal congestion, chills without fever and wheezing.  He is unvaccinated against Covid. Covid testing was negative.  RSV was positive.  He was admitted for supportive care. On admission he did require oxygen via nasal cannula as his oxygenation dropped to 87% with ambulation.  However today prior to discharge he ambulated without oxygen on room air he was satting above 92%.  Chest x-ray was negative.  Troponins negative  Acute bronchitis due to respiratory syncytial virus (RSV)   Hypoxia Antitussives Albuterol inhaler Will give short course of steroid   Acute hypoxic respiratory failure-secondary to RSV Has improved With ambulation on room air satting above 92%     Hypertension Better control.  Continue with amlodipine 10 daily    Morbid obesity with BMI of 60.0-69.9, adult (HCC) -Complicates overall prognosis and care   Discharge Diagnoses:  Principal Problem:   Acute bronchitis due to respiratory syncytial virus (RSV) Active Problems:   Hypertension   Morbid obesity with BMI of 60.0-69.9, adult (HCC)   Hypoxia   Acute bronchitis    Discharge Instructions  Discharge Instructions    Call MD for:  difficulty breathing, headache or visual disturbances   Complete by: As directed    Call MD for:  temperature >100.4   Complete by:  As directed    Diet - low sodium heart healthy   Complete by: As directed    Diet Carb Modified   Complete by: As directed    Discharge instructions   Complete by: As directed    F/u with pcp in one week   Increase activity slowly   Complete by: As directed      Allergies as of 07/21/2020   No Known Allergies     Medication List    STOP taking these medications   acetaminophen 325 MG tablet Commonly known as: TYLENOL   ondansetron 4 MG tablet Commonly known as: ZOFRAN   oxyCODONE 5 MG immediate release tablet Commonly known as: Oxy IR/ROXICODONE   sodium hypochlorite 0.125 % Soln Commonly known as: DAKIN'S 1/4 STRENGTH     TAKE these medications   albuterol 108 (90 Base) MCG/ACT inhaler Commonly known as: VENTOLIN HFA Inhale 2 puffs into the lungs every 6 (six) hours as needed for wheezing or shortness of breath.   amLODipine 10 MG tablet Commonly known as: NORVASC Take 1 tablet (10 mg total) by mouth daily. What changed: Another medication with the same name was added. Make sure you understand how and when to take each.   amLODipine 10 MG tablet Commonly known as: NORVASC Take 1 tablet (10 mg total) by mouth daily. What changed: You were already taking a medication with the same name, and this prescription was added. Make sure you understand how and when to take each.   docusate sodium 100 MG capsule Commonly known as: COLACE Take 1 capsule (100 mg total) by mouth  2 (two) times daily.   Ensure Max Protein Liqd Take 330 mLs (11 oz total) by mouth 3 (three) times daily between meals.   multivitamin with minerals Tabs tablet Take 1 tablet by mouth daily.   nicotine 14 mg/24hr patch Commonly known as: NICODERM CQ - dosed in mg/24 hours Place 1 patch (14 mg total) onto the skin daily.   predniSONE 20 MG tablet Commonly known as: DELTASONE Take 1 tablet (20 mg total) by mouth daily.       No Known  Allergies  Consultations:     Procedures/Studies: DG Chest 2 View  Result Date: 07/19/2020 CLINICAL DATA:  Shortness of breath; cough EXAM: CHEST - 2 VIEW COMPARISON:  October 21, 2013 FINDINGS: There is no edema or airspace opacity. Heart is upper normal in size with pulmonary vascularity normal. No appreciable adenopathy. There is slight degenerative change in the thoracic spine. IMPRESSION: Lungs clear.  Heart upper normal in size.  No adenopathy evident. Electronically Signed   By: Bretta Bang III M.D.   On: 07/19/2020 19:10       Subjective: Feels much better.  Shortness of breath improved.  No coughing  Discharge Exam: Vitals:   07/21/20 0806 07/21/20 0902  BP: 133/78   Pulse: 63   Resp: 17   Temp: 97.6 F (36.4 C)   SpO2: 97% 92%   Vitals:   07/21/20 0230 07/21/20 0442 07/21/20 0806 07/21/20 0902  BP:  (!) 146/80 133/78   Pulse:  (!) 59 63   Resp:  16 17   Temp:  98.1 F (36.7 C) 97.6 F (36.4 C)   TempSrc:   Oral   SpO2: 97% 100% 97% 92%  Weight:      Height:        General: Pt is alert, awake, not in acute distress Cardiovascular: RRR, S1/S2 +, no rubs, no gallops Respiratory: cta overall with minimal end exp wheeze Abdominal: Soft, NT, ND, bowel sounds + Extremities: no edema, no cyanosis    The results of significant diagnostics from this hospitalization (including imaging, microbiology, ancillary and laboratory) are listed below for reference.     Microbiology: Recent Results (from the past 240 hour(s))  Resp Panel by RT PCR (RSV, Flu A&B, Covid) - Nasopharyngeal Swab     Status: Abnormal   Collection Time: 07/20/20 12:17 AM   Specimen: Nasopharyngeal Swab  Result Value Ref Range Status   SARS Coronavirus 2 by RT PCR NEGATIVE NEGATIVE Final    Comment: (NOTE) SARS-CoV-2 target nucleic acids are NOT DETECTED.  The SARS-CoV-2 RNA is generally detectable in upper respiratoy specimens during the acute phase of infection. The  lowest concentration of SARS-CoV-2 viral copies this assay can detect is 131 copies/mL. A negative result does not preclude SARS-Cov-2 infection and should not be used as the sole basis for treatment or other patient management decisions. A negative result may occur with  improper specimen collection/handling, submission of specimen other than nasopharyngeal swab, presence of viral mutation(s) within the areas targeted by this assay, and inadequate number of viral copies (<131 copies/mL). A negative result must be combined with clinical observations, patient history, and epidemiological information. The expected result is Negative.  Fact Sheet for Patients:  https://www.moore.com/  Fact Sheet for Healthcare Providers:  https://www.young.biz/  This test is no t yet approved or cleared by the [place] FDA and  has been authorized for detection and/or diagnosis of SARS-CoV-2 by FDA under an Emergency Use Authorization (EUA). This EUA will remain  in effect (meaning this test can be used) for the duration of the COVID-19 declaration under Section 564(b)(1) of the Act, 21 U.S.C. section 360bbb-3(b)(1), unless the authorization is terminated or revoked sooner.     Influenza A by PCR NEGATIVE NEGATIVE Final   Influenza B by PCR NEGATIVE NEGATIVE Final    Comment: (NOTE) The Xpert Xpress SARS-CoV-2/FLU/RSV assay is intended as an aid in  the diagnosis of influenza from Nasopharyngeal swab specimens and  should not be used as a sole basis for treatment. Nasal washings and  aspirates are unacceptable for Xpert Xpress SARS-CoV-2/FLU/RSV  testing.  Fact Sheet for Patients: https://www.moore.com/  Fact Sheet for Healthcare Providers: https://www.young.biz/  This test is not yet approved or cleared by the [place] FDA and  has been authorized for detection and/or diagnosis of SARS-CoV-2 by  FDA under  an Emergency Use Authorization (EUA). This EUA will remain  in effect (meaning this test can be used) for the duration of the  Covid-19 declaration under Section 564(b)(1) of the Act, 21  U.S.C. section 360bbb-3(b)(1), unless the authorization is  terminated or revoked.    Respiratory Syncytial Virus by PCR POSITIVE (A) NEGATIVE Final    Comment: (NOTE) Fact Sheet for Patients: https://www.moore.com/  Fact Sheet for Healthcare Providers: https://www.young.biz/  This test is not yet approved or cleared by the [place] FDA and  has been authorized for detection and/or diagnosis of SARS-CoV-2 by  FDA under an Emergency Use Authorization (EUA). This EUA will remain  in effect (meaning this test can be used) for the duration of the  COVID-19 declaration under Section 564(b)(1) of the Act, 21 U.S.C.  section 360bbb-3(b)(1), unless the authorization is terminated or  revoked. Performed at [facility], [address] [place], [place] [address]   MRSA PCR Screening     Status: None   Collection Time: 07/21/20 12:05 AM   Specimen: Nasopharyngeal  Result Value Ref Range Status   MRSA by PCR NEGATIVE NEGATIVE Final    Comment:        The GeneXpert MRSA Assay (FDA approved for NASAL specimens only), is one component of a comprehensive MRSA colonization surveillance program. It is not intended to diagnose MRSA infection nor to guide or monitor treatment for MRSA infections. Performed at [facility], [address] [place], [place] [address]      Labs: BNP (last 3 results) Recent Labs    07/19/20 1859  BNP 20.0   Basic Metabolic Panel: Recent Labs  Lab 07/19/20 1859  NA 137  K 3.5  CL 101  CO2 25  GLUCOSE 100*  BUN 9  CREATININE 0.55*  CALCIUM 8.7*   Liver Function Tests: Recent Labs  Lab 07/19/20 1859  AST 25  ALT 29  ALKPHOS 77  BILITOT 1.3*  PROT 8.1  ALBUMIN 3.9   No results for input(s):  LIPASE, AMYLASE in the last 168 hours. No results for input(s): AMMONIA in the last 168 hours. CBC: Recent Labs  Lab 07/19/20 1859  WBC 5.4  NEUTROABS 3.5  HGB 16.4  HCT 49.3  MCV 85.6  PLT 237   Cardiac Enzymes: No results for input(s): CKTOTAL, CKMB, CKMBINDEX, TROPONINI in the last 168 hours. BNP: Invalid input(s): POCBNP CBG: No results for input(s): GLUCAP in the last 168 hours. D-Dimer No results for input(s): DDIMER in the last 72 hours. Hgb A1c No results for input(s): HGBA1C in the last 72 hours. Lipid Profile No results for input(s): CHOL, HDL, LDLCALC, TRIG,  CHOLHDL, LDLDIRECT in the last 72 hours. Thyroid function studies No results for input(s): TSH, T4TOTAL, T3FREE, THYROIDAB in the last 72 hours.  Invalid input(s): FREET3 Anemia work up No results for input(s): VITAMINB12, FOLATE, FERRITIN, TIBC, IRON, RETICCTPCT in the last 72 hours. Urinalysis No results found for: COLORURINE, APPEARANCEUR, LABSPEC, PHURINE, GLUCOSEU, HGBUR, BILIRUBINUR, KETONESUR, PROTEINUR, UROBILINOGEN, NITRITE, LEUKOCYTESUR Sepsis Labs Invalid input(s): PROCALCITONIN,  WBC,  LACTICIDVEN Microbiology Recent Results (from the past 240 hour(s))  Resp Panel by RT PCR (RSV, Flu A&B, Covid) - Nasopharyngeal Swab     Status: Abnormal   Collection Time: 07/20/20 12:17 AM   Specimen: Nasopharyngeal Swab  Result Value Ref Range Status   SARS Coronavirus 2 by RT PCR NEGATIVE NEGATIVE Final    Comment: (NOTE) SARS-CoV-2 target nucleic acids are NOT DETECTED.  The SARS-CoV-2 RNA is generally detectable in upper respiratoy specimens during the acute phase of infection. The lowest concentration of SARS-CoV-2 viral copies this assay can detect is 131 copies/mL. A negative result does not preclude SARS-Cov-2 infection and should not be used as the sole basis for treatment or other patient management decisions. A negative result may occur with  improper specimen collection/handling, submission of  specimen other than nasopharyngeal swab, presence of viral mutation(s) within the areas targeted by this assay, and inadequate number of viral copies (<131 copies/mL). A negative result must be combined with clinical observations, patient history, and epidemiological information. The expected result is Negative.  Fact Sheet for Patients:  https://www.moore.com/https://www.fda.gov/media/142436/download  Fact Sheet for Healthcare Providers:  https://www.young.biz/https://www.fda.gov/media/142435/download  This test is no t yet approved or cleared by the [place] FDA and  has been authorized for detection and/or diagnosis of SARS-CoV-2 by FDA under an Emergency Use Authorization (EUA). This EUA will remain  in effect (meaning this test can be used) for the duration of the COVID-19 declaration under Section 564(b)(1) of the Act, 21 U.S.C. section 360bbb-3(b)(1), unless the authorization is terminated or revoked sooner.     Influenza A by PCR NEGATIVE NEGATIVE Final   Influenza B by PCR NEGATIVE NEGATIVE Final    Comment: (NOTE) The Xpert Xpress SARS-CoV-2/FLU/RSV assay is intended as an aid in  the diagnosis of influenza from Nasopharyngeal swab specimens and  should not be used as a sole basis for treatment. Nasal washings and  aspirates are unacceptable for Xpert Xpress SARS-CoV-2/FLU/RSV  testing.  Fact Sheet for Patients: https://www.moore.com/https://www.fda.gov/media/142436/download  Fact Sheet for Healthcare Providers: https://www.young.biz/https://www.fda.gov/media/142435/download  This test is not yet approved or cleared by the [place] FDA and  has been authorized for detection and/or diagnosis of SARS-CoV-2 by  FDA under an Emergency Use Authorization (EUA). This EUA will remain  in effect (meaning this test can be used) for the duration of the  Covid-19 declaration under Section 564(b)(1) of the Act, 21  U.S.C. section 360bbb-3(b)(1), unless the authorization is  terminated or revoked.    Respiratory Syncytial Virus by PCR POSITIVE (A)  NEGATIVE Final    Comment: (NOTE) Fact Sheet for Patients: https://www.moore.com/https://www.fda.gov/media/142436/download  Fact Sheet for Healthcare Providers: https://www.young.biz/https://www.fda.gov/media/142435/download  This test is not yet approved or cleared by the [place] FDA and  has been authorized for detection and/or diagnosis of SARS-CoV-2 by  FDA under an Emergency Use Authorization (EUA). This EUA will remain  in effect (meaning this test can be used) for the duration of the  COVID-19 declaration under Section 564(b)(1) of the Act, 21 U.S.C.  section 360bbb-3(b)(1), unless the authorization is terminated or  revoked. Performed at  [facility], [address] [place], [place] [address]   MRSA PCR Screening     Status: None   Collection Time: 07/21/20 12:05 AM   Specimen: Nasopharyngeal  Result Value Ref Range Status   MRSA by PCR NEGATIVE NEGATIVE Final    Comment:        The GeneXpert MRSA Assay (FDA approved for NASAL specimens only), is one component of a comprehensive MRSA colonization surveillance program. It is not intended to diagnose MRSA infection nor to guide or monitor treatment for MRSA infections. Performed at [facility], [address], [place], [place] [address]      Time coordinating discharge: Over 30 minutes  SIGNED:   Lynn Ito, MD  [facility] 07/21/2020, 3:33 PM Pager   If 7PM-7AM, please contact night-coverage [email or web address] Password [email or web address]

## 2020-07-21 NOTE — Progress Notes (Signed)
Pt completed 1 lap around 1C unit on RA, O2 sat decreased to 91% but quickly recovered to 93% ambulating and 97% at rest. Pt denies SOB or increased work of breathing.

## 2021-01-05 ENCOUNTER — Telehealth: Payer: Self-pay

## 2021-01-05 NOTE — Telephone Encounter (Signed)
LVM telling pt time/date of new pt appt

## 2021-01-24 ENCOUNTER — Ambulatory Visit: Payer: Self-pay | Admitting: Adult Health

## 2021-01-25 ENCOUNTER — Ambulatory Visit: Payer: Self-pay

## 2021-08-14 IMAGING — CR DG CHEST 2V
2 series · 2 of 2 positions shown · non-contrast
Comparison: October 21, 2013

CLINICAL DATA: Shortness of breath; cough

EXAM:
CHEST - 2 VIEW

[chest pa]
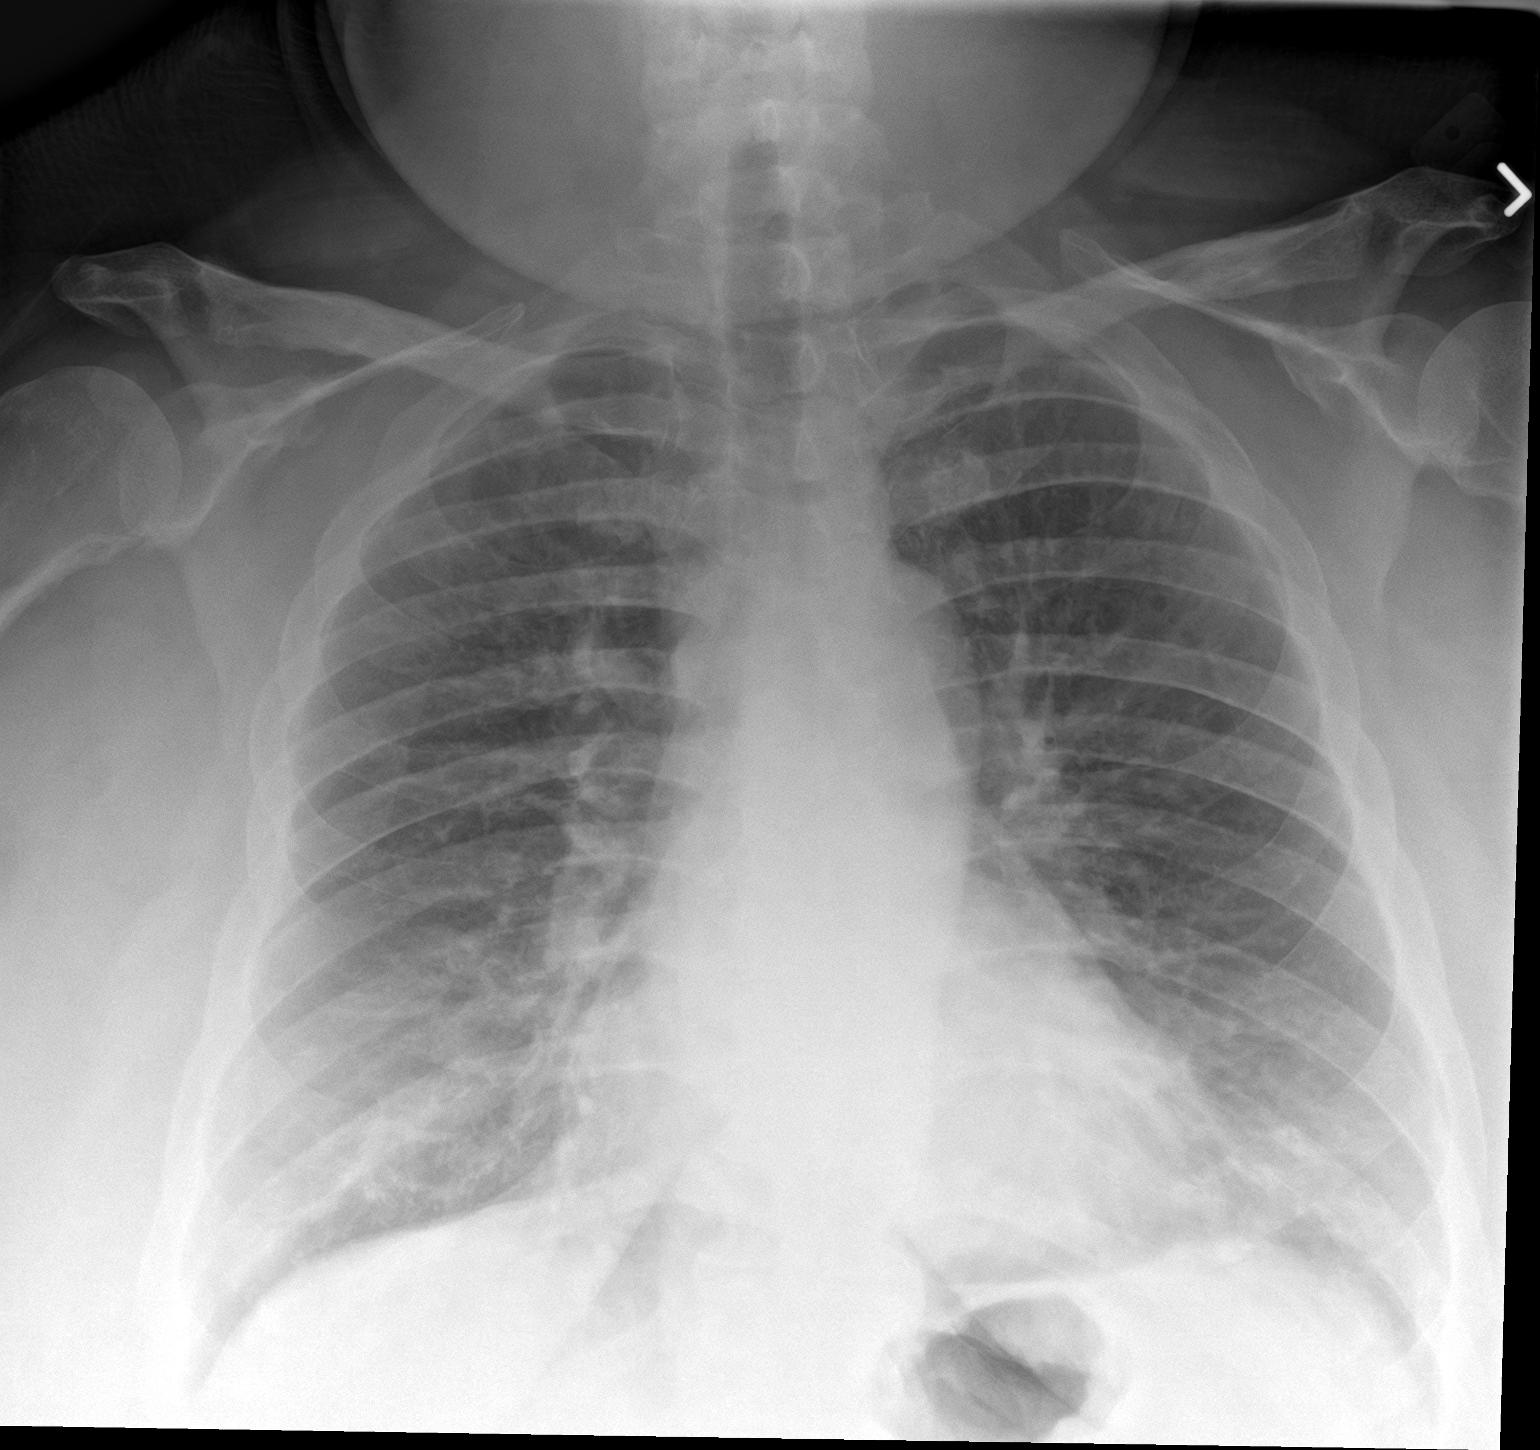

[chest lat]
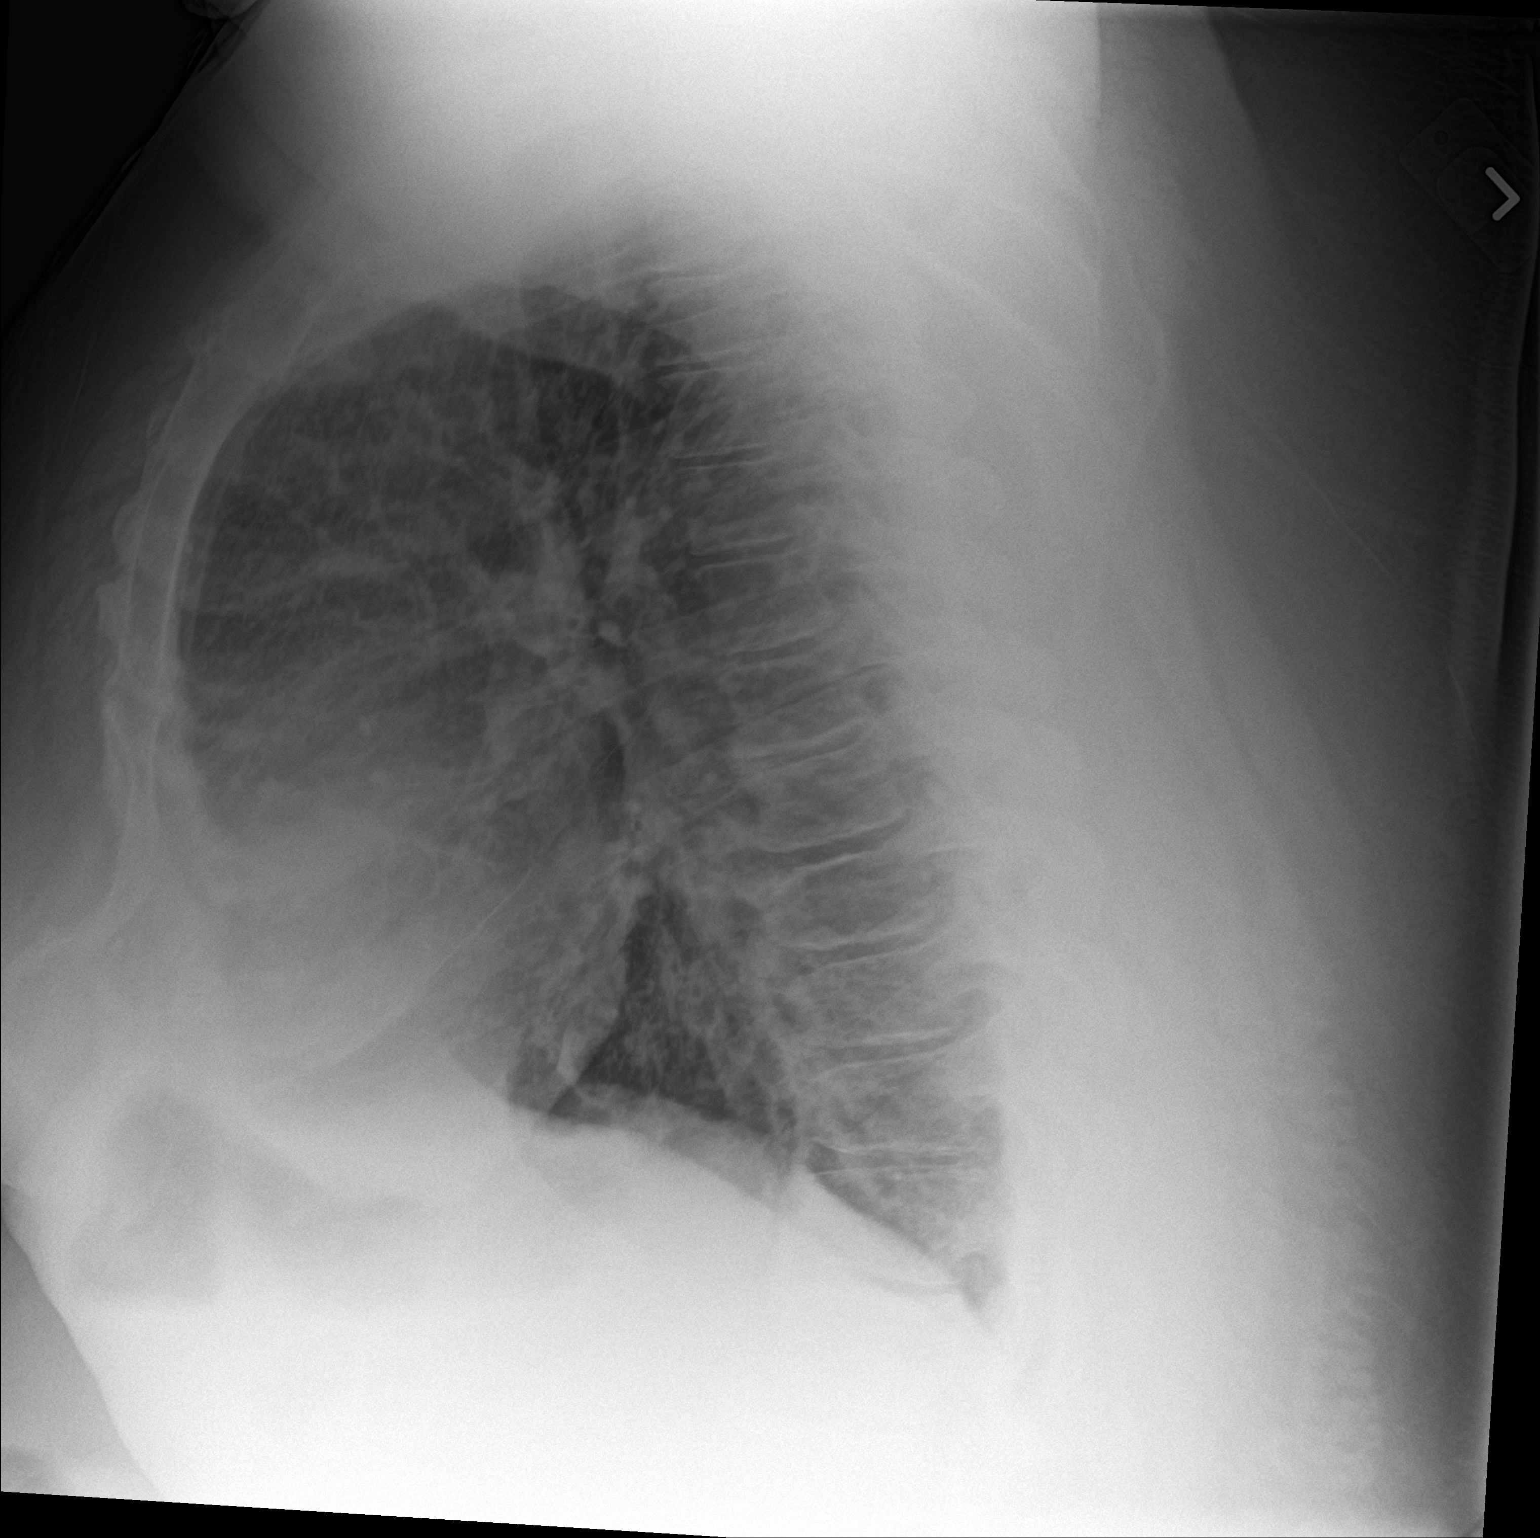

[2 of 2 positions shown; findings below may reference images not displayed]

FINDINGS: There is no edema or airspace opacity. Heart is upper normal in size
with pulmonary vascularity normal. No appreciable adenopathy. There
is slight degenerative change in the thoracic spine.
IMPRESSION: Lungs clear.  Heart upper normal in size.  No adenopathy evident.

## 2022-08-13 ENCOUNTER — Other Ambulatory Visit: Payer: Self-pay

## 2022-08-13 ENCOUNTER — Emergency Department: Payer: Self-pay

## 2022-08-13 ENCOUNTER — Inpatient Hospital Stay
Admission: EM | Admit: 2022-08-13 | Discharge: 2022-08-14 | DRG: 291 | Disposition: A | Discharge status: Home / Self Care | Payer: Self-pay | Attending: Obstetrics and Gynecology | Admitting: Obstetrics and Gynecology

## 2022-08-13 DIAGNOSIS — Z20822 Contact with and (suspected) exposure to covid-19: Secondary | ICD-10-CM | POA: Diagnosis present

## 2022-08-13 DIAGNOSIS — I160 Hypertensive urgency: Secondary | ICD-10-CM | POA: Diagnosis present

## 2022-08-13 DIAGNOSIS — I5031 Acute diastolic (congestive) heart failure: Secondary | ICD-10-CM | POA: Diagnosis present

## 2022-08-13 DIAGNOSIS — I509 Heart failure, unspecified: Secondary | ICD-10-CM

## 2022-08-13 DIAGNOSIS — J9601 Acute respiratory failure with hypoxia: Secondary | ICD-10-CM | POA: Diagnosis present

## 2022-08-13 DIAGNOSIS — Z833 Family history of diabetes mellitus: Secondary | ICD-10-CM

## 2022-08-13 DIAGNOSIS — F17210 Nicotine dependence, cigarettes, uncomplicated: Secondary | ICD-10-CM | POA: Diagnosis present

## 2022-08-13 DIAGNOSIS — Z6841 Body Mass Index (BMI) 40.0 and over, adult: Secondary | ICD-10-CM

## 2022-08-13 DIAGNOSIS — I452 Bifascicular block: Secondary | ICD-10-CM | POA: Diagnosis present

## 2022-08-13 DIAGNOSIS — Z811 Family history of alcohol abuse and dependence: Secondary | ICD-10-CM

## 2022-08-13 DIAGNOSIS — I110 Hypertensive heart disease with heart failure: Principal | ICD-10-CM | POA: Diagnosis present

## 2022-08-13 DIAGNOSIS — Z800 Family history of malignant neoplasm of digestive organs: Secondary | ICD-10-CM

## 2022-08-13 DIAGNOSIS — Z720 Tobacco use: Secondary | ICD-10-CM

## 2022-08-13 DIAGNOSIS — I10 Essential (primary) hypertension: Secondary | ICD-10-CM | POA: Diagnosis present

## 2022-08-13 LAB — CBC
HCT: 45.9 % (ref 39.0–52.0)
Hemoglobin: 14.9 g/dL (ref 13.0–17.0)
MCH: 27.7 pg (ref 26.0–34.0)
MCHC: 32.5 g/dL (ref 30.0–36.0)
MCV: 85.5 fL (ref 80.0–100.0)
Platelets: 326 10*3/uL (ref 150–400)
RBC: 5.37 MIL/uL (ref 4.22–5.81)
RDW: 12.8 % (ref 11.5–15.5)
WBC: 11 10*3/uL — ABNORMAL HIGH (ref 4.0–10.5)
nRBC: 0 % (ref 0.0–0.2)

## 2022-08-13 LAB — COMPREHENSIVE METABOLIC PANEL
ALT: 27 U/L (ref 0–44)
AST: 24 U/L (ref 15–41)
Albumin: 3.4 g/dL — ABNORMAL LOW (ref 3.5–5.0)
Alkaline Phosphatase: 74 U/L (ref 38–126)
Anion gap: 9 (ref 5–15)
BUN: 15 mg/dL (ref 6–20)
CO2: 27 mmol/L (ref 22–32)
Calcium: 8.8 mg/dL — ABNORMAL LOW (ref 8.9–10.3)
Chloride: 104 mmol/L (ref 98–111)
Creatinine, Ser: 0.63 mg/dL (ref 0.61–1.24)
GFR, Estimated: >60 mL/min (ref >60)
Glucose, Bld: 150 mg/dL — ABNORMAL HIGH (ref 70–99)
Potassium: 3.5 mmol/L (ref 3.5–5.1)
Sodium: 140 mmol/L (ref 135–145)
Total Bilirubin: 0.7 mg/dL (ref 0.3–1.2)
Total Protein: 7.4 g/dL (ref 6.5–8.1)

## 2022-08-13 LAB — RESP PANEL BY RT-PCR (FLU A&B, COVID) ARPGX2
Influenza A by PCR: NEGATIVE
Influenza B by PCR: NEGATIVE
SARS Coronavirus 2 by RT PCR: NEGATIVE

## 2022-08-13 LAB — BRAIN NATRIURETIC PEPTIDE: B Natriuretic Peptide: 17.9 pg/mL (ref 0.0–100.0)

## 2022-08-13 LAB — TROPONIN I (HIGH SENSITIVITY): Troponin I (High Sensitivity): 9 ng/L (ref <18)

## 2022-08-13 MED ORDER — MAGNESIUM HYDROXIDE 400 MG/5ML PO SUSP: 30.0000 mL | Freq: Every day | ORAL | Status: DC | PRN

## 2022-08-13 MED ORDER — FUROSEMIDE 10 MG/ML IJ SOLN
40.0000 mg | Freq: Once | INTRAMUSCULAR | Status: AC
  Administered 2022-08-13: 40 mg via INTRAVENOUS
  Filled 2022-08-13: qty 4

## 2022-08-13 MED ORDER — FUROSEMIDE 10 MG/ML IJ SOLN
40.0000 mg | Freq: Two times a day (BID) | INTRAMUSCULAR | Status: DC
  Administered 2022-08-14: 40 mg via INTRAVENOUS
  Filled 2022-08-13: qty 4

## 2022-08-13 MED ORDER — ACETAMINOPHEN 325 MG PO TABS: 650.0000 mg | ORAL_TABLET | Freq: Four times a day (QID) | ORAL | Status: DC | PRN

## 2022-08-13 MED ORDER — ACETAMINOPHEN 325 MG RE SUPP: 650.0000 mg | Freq: Four times a day (QID) | RECTAL | Status: DC | PRN

## 2022-08-13 MED ORDER — ALBUTEROL SULFATE HFA 108 (90 BASE) MCG/ACT IN AERS: 2.0000 | INHALATION_SPRAY | RESPIRATORY_TRACT | Status: DC | PRN

## 2022-08-13 MED ORDER — ENOXAPARIN SODIUM 120 MG/0.8ML IJ SOSY
0.5000 mg/kg | PREFILLED_SYRINGE | INTRAMUSCULAR | Status: DC
  Administered 2022-08-13: 105 mg via SUBCUTANEOUS
  Filled 2022-08-13: qty 0.7

## 2022-08-13 MED ORDER — TRAZODONE HCL 50 MG PO TABS
25.0000 mg | ORAL_TABLET | Freq: Every evening | ORAL | Status: DC | PRN
  Filled 2022-08-13: qty 1

## 2022-08-13 MED ORDER — ONDANSETRON HCL 4 MG/2ML IJ SOLN: 4.0000 mg | Freq: Four times a day (QID) | INTRAMUSCULAR | Status: DC | PRN

## 2022-08-13 MED ORDER — ONDANSETRON HCL 4 MG PO TABS: 4.0000 mg | ORAL_TABLET | Freq: Four times a day (QID) | ORAL | Status: DC | PRN

## 2022-08-13 NOTE — Progress Notes (Signed)
Anticoagulation monitoring(Lovenox):  36 yo male ordered Lovenox 40 mg Q24h    Filed Weights   08/13/22 2150  Weight: (!) 211.8 kg (467 lb)   BMI 73.14   Lab Results  Component Value Date   CREATININE 0.63 08/13/2022   CREATININE 0.55 (L) 07/19/2020   CREATININE 0.52 (L) 02/24/2019   Estimated Creatinine Clearance: 220.3 mL/min (by C-G formula based on SCr of 0.63 mg/dL). Hemoglobin & Hematocrit     Component Value Date/Time   HGB 14.9 08/13/2022 2202   HGB 14.3 10/21/2013 0711   HCT 45.9 08/13/2022 2202   HCT 43.6 10/21/2013 0711     Per Protocol for Patient with estCrcl > 30 ml/min and BMI > 30, will transition to Lovenox 105 mg Q24h.

## 2022-08-13 NOTE — Assessment & Plan Note (Signed)
-   He will be counseled for smoking cessation. 

## 2022-08-13 NOTE — H&P (Signed)
[place]   PATIENT NAME: Christopher Keith    MR#:  [id number]  DATE OF BIRTH:  [birth year]  DATE OF ADMISSION:  08/13/2022  PRIMARY CARE PHYSICIAN: Patient, No Pcp Per   Patient is coming from: Home  REQUESTING/REFERRING PHYSICIAN: Marjean Donna, MD  CHIEF COMPLAINT:   Chief Complaint  Patient presents with   Shortness of Breath    HISTORY OF PRESENT ILLNESS:  Christopher Keith is a 36 y.o. Hispanic American male with medical history significant for obesity and hypertension, who presented to the emergency room with acute onset of acute respiratory distress with dyspnea and initial hypoxia requiring 8 L of O2 by nasal cannula by EMS.  He admitted to orthopnea and paroxysmal nocturnal dyspnea as well as bilateral lower extremity edema and recent cough productive of green and yellow sputum as well as wheezing.  He denies any fever or chills.  He also had rhinorrhea nasal congestion without sore throat last week and was prescribed Zithromax prednisone and albuterol for acute bronchitis.  He has not taken his blood pressure medications for a couple of years as he was not able to afford seeing his primary care physician.  He was apparently on Lisinopril/HCT 20/25 mg p.o. daily.  He denied any dysuria, oliguria or hematuria or flank pain.  No nausea or vomiting or abdominal pain.  No chest pain or palpitations.  No recent travels or surgeries.  He denies any leg pain.  ED Course: When he came to the ER`, BP was 179/144 with otherwise normal vital signs.  Later the patient was tachypneic with reported respiratory rate of 48. He did not tolerate BiPAP was later placed on nasal cannula then room air and pulse symmetry on room air was 92 to 94%.  Labs revealed Borderline potassium of 3.5 and glucose of 150 with calcium 8.8 and albumin 3.1 otherwise unremarkable CMP.  BNP was 17.9 and CBC showed WBC of 11.  Influenza antigens and COVID-19 PCR came back negative.  EKG as reviewed by me : EKG showed sinus  rhythm with rate of 81 with incomplete right bundle branch block and left anterior fascicular block with Q waves anteroseptally and T wave inversion laterally. Imaging: Portable chest X ray showed cardiomegaly with mild pulmonary vascular congestion and mild left basilar atelectasis.  The patient was given 40 mg of IV Lasix and albuterol inhaler.  He will be admitted to a cardiac telemetry bed for further evaluation and management. PAST MEDICAL HISTORY:   Past Medical History:  Diagnosis Date   Hypertension    MRSA carrier    Obesity     PAST SURGICAL HISTORY:   Past Surgical History:  Procedure Laterality Date   ADENOIDECTOMY     IRRIGATION AND DEBRIDEMENT ABSCESS Right 02/23/2019   Procedure: IRRIGATION AND DEBRIDEMENT PERI RECTAL ABSCESS;  Surgeon: Rolm Bookbinder, MD;  Location: [facility];  Service: General;  Laterality: Right;   TONSILLECTOMY      SOCIAL HISTORY:   Social History   Tobacco Use   Smoking status: Some Days    Types: Cigarettes   Smokeless tobacco: Never  Substance Use Topics   Alcohol use: Yes    FAMILY HISTORY:   Family History  Problem Relation Age of Onset   Gastric cancer Mother    Alcohol abuse Father    Obesity Father    Diabetes Mellitus II Brother     DRUG ALLERGIES:  No Known Allergies  REVIEW OF SYSTEMS:   ROS As per  history of present illness. All pertinent systems were reviewed above. Constitutional, HEENT, cardiovascular, respiratory, GI, GU, musculoskeletal, neuro, psychiatric, endocrine, integumentary and hematologic systems were reviewed and are otherwise negative/unremarkable except for positive findings mentioned above in the HPI.   MEDICATIONS AT HOME:   Prior to Admission medications   Medication Sig Start Date End Date Taking? Authorizing Provider  albuterol (VENTOLIN HFA) 108 (90 Base) MCG/ACT inhaler Inhale 2 puffs into the lungs every 6 (six) hours as needed for wheezing or shortness of breath. 07/21/20   Nolberto Hanlon, MD  azithromycin (ZITHROMAX) 250 MG tablet Take 250 mg by mouth daily. Patient not taking: Reported on 08/13/2022 08/06/22   [provider]  predniSONE (DELTASONE) 20 MG tablet Take 1 tablet (20 mg total) by mouth daily. Patient not taking: Reported on 08/13/2022 07/21/20   Nolberto Hanlon, MD      VITAL SIGNS:  Blood pressure (!) 179/144, pulse 77, temperature 97.9 F (36.6 C), resp. rate 15, height 5\' 7"  (1.702 m), weight (!) 211.8 kg, SpO2 94 %.  PHYSICAL EXAMINATION:  Physical Exam  GENERAL:  36 y.o.-year-old Hispanic male patient lying in the bed with no acute distress.  EYES: Pupils equal, round, reactive to light and accommodation. No scleral icterus. Extraocular muscles intact.  HEENT: Head atraumatic, normocephalic. Oropharynx and nasopharynx clear.  NECK:  Supple, no jugular venous distention. No thyroid enlargement, no tenderness.  LUNGS: Diminished bibasilar breath sounds with bibasilar rales.  No use of accessory muscles of respiration.  CARDIOVASCULAR: Regular rate and rhythm, S1, S2 normal. No murmurs, rubs, or gallops.  ABDOMEN: Soft, nondistended, nontender. Bowel sounds present. No organomegaly or mass.  EXTREMITIES: 2+ pedal extremity pitting edema with no  cyanosis, or clubbing.  NEUROLOGIC: Cranial nerves II through XII are intact. Muscle strength 5/5 in all extremities. Sensation intact. Gait not checked.  PSYCHIATRIC: The patient is alert and oriented x 3.  Normal affect and good eye contact. SKIN: No obvious rash, lesion, or ulcer.   LABORATORY PANEL:   CBC Recent Labs  Lab 08/13/22 2202  WBC 11.0*  HGB 14.9  HCT 45.9  PLT 326   ------------------------------------------------------------------------------------------------------------------  Chemistries  Recent Labs  Lab 08/13/22 2202  NA 140  K 3.5  CL 104  CO2 27  GLUCOSE 150*  BUN 15  CREATININE 0.63  CALCIUM 8.8*  AST 24  ALT 27  ALKPHOS 74  BILITOT 0.7    ------------------------------------------------------------------------------------------------------------------  Cardiac Enzymes No results for input(s): "TROPONINI" in the last 168 hours. ------------------------------------------------------------------------------------------------------------------  RADIOLOGY:  DG Chest Portable 1 View  Result Date: 08/13/2022 CLINICAL DATA:  Shortness of breath and diaphoresis. EXAM: PORTABLE CHEST 1 VIEW COMPARISON:  None Available. FINDINGS: The cardiac silhouette is mildly enlarged. There is mild prominence of the perihilar pulmonary vasculature. Mild atelectasis is seen within the left lung base. There is no evidence of a pleural effusion or pneumothorax. The visualized skeletal structures are unremarkable. IMPRESSION: 1. Cardiomegaly with mild pulmonary vascular congestion. 2. Mild left basilar atelectasis. Electronically Signed   By: Virgina Norfolk M.D.   On: 08/13/2022 22:30      IMPRESSION AND PLAN:  Assessment and Plan: * Acute CHF (congestive heart failure) (HCC) - I Suspect diastolic etiology. - The patient will be admitted to a cardiac telemetry bed. - This could be secondary to viral myocarditis from recent URI as well as hypertensive urgency. - We will continue diuresis with IV Lasix. - We will follow serial troponins. - We will obtain a 2D echo  and cardiology consult. - I notified Dr. Humphrey Rolls about the patient.  Hypertensive urgency - We will continue his antihypertensives. - We will place him on as needed IV labetalol and hydralazine. - This could be the culprit for his acute CHF.  Acute respiratory failure with hypoxia (HCC) - The patient had initial hypoxia for EMS requiring 8 L of O2 by nasal cannula however he was satting well on room air. - O2 protocol will be followed.  Tobacco use - He will be counseled for smoking cessation.   DVT prophylaxis: Lovenox.  Advanced Care Planning:  Code Status: full code.  Family  Communication:  The plan of care was discussed in details with the patient (and family). I answered all questions. The patient agreed to proceed with the above mentioned plan. Further management will depend upon hospital course. Disposition Plan: Back to previous home environment Consults called: Cardiology. All the records are reviewed and case discussed with ED provider.  Status is: Inpatient  At the time of the admission, it appears that the appropriate admission status for this patient is inpatient.  This is judged to be reasonable and necessary in order to provide the required intensity of service to ensure the patient's safety given the presenting symptoms, physical exam findings and initial radiographic and laboratory data in the context of comorbid conditions.  The patient requires inpatient status due to high intensity of service, high risk of further deterioration and high frequency of surveillance required.  I certify that at the time of admission, it is my clinical judgment that the patient will require inpatient hospital care extending more than 2 midnights.                            Dispo: The patient is from: Home              Anticipated d/c is to: Home              Patient currently is not medically stable to d/c.              Difficult to place patient: No  Christel Mormon M.D on 08/13/2022 at 11:32 PM  [facility]   From 7 PM-7 AM, contact night-coverage [email or web address]  CC: Primary care physician; Patient, No Pcp Per

## 2022-08-13 NOTE — Assessment & Plan Note (Signed)
-   We will continue his antihypertensives. - We will place him on as needed IV labetalol and hydralazine. - This could be the culprit for his acute CHF.

## 2022-08-13 NOTE — ED Provider Notes (Signed)
[facility] Provider Note    Event Date/Time   First MD Initiated Contact with Patient 08/13/22 2150     (approximate)   History   Shortness of Breath   HPI  Christopher Keith is a 36 y.o. male who comes in with concerns for shortness of breath.  With EMS patient was given 3 DuoNebs, albuterol and 125 of Solu-Medrol.  I reviewed patient's hospital admission from 2021 patient has a history of hypertension and morbid obesity.  He has had prior admission requiring oxygen secondary to RSV.  Patient reports that he was recently on medications for upper respiratory infection including albuterol Z-Pak and prednisone but became acutely more short of breath when walking to the car.  Patient reports that he has completed his course of antibiotics and steroids but when he felt like he was still getting worse he was going to come to the ER and that when he was try to walk to his car he turned around because he felt extremely short of breath and called EMS when EMS got there he was in respiratory distress placed on 8 L.  He was trialed on BiPAP and initially got here but did not tolerate it and placed on room air with oxygen levels in the 90s.  He reports feeling somewhat improved but still feeling short of breath.  He denies any coughing up blood, prolonged immobilization, history of blood clots or heart attack.  He reports that he really just feels like he is got a lot of mucus or coughing that it was his biggest symptom prior to the worsening shortness of breath.    Physical Exam   Triage Vital Signs: ED Triage Vitals [08/13/22 2145]  Enc Vitals Group     BP      Pulse      Resp      Temp      Temp src      SpO2 98 %     Weight      Height      Head Circumference      Peak Flow      Pain Score      Pain Loc      Pain Edu?      Excl. in GC?     Most recent vital signs: Vitals:   08/13/22 2145 08/13/22 2149  SpO2: 98% 96%     General: Awake, no distress.   CV:  Good peripheral perfusion.  Resp:  Normal effort.  Abd:  No distention.  Other:  Trace edema noted bilaterally which patient reports is baseline   ED Results / Procedures / Treatments   Labs (all labs ordered are listed, but only abnormal results are displayed) Labs Reviewed  RESP PANEL BY RT-PCR (FLU A&B, COVID) [id number]  CBC  COMPREHENSIVE METABOLIC PANEL  BRAIN NATRIURETIC PEPTIDE  TROPONIN I (HIGH SENSITIVITY)     EKG  My interpretation of EKG:  Normal sinus rate of 81 without any ST elevation or T wave inversions except aVL, normal intervals  RADIOLOGY I have reviewed the xray personally and interpreted and patient has some mild cardiomegaly with vascular congestion  PROCEDURES:  Critical Care performed: Yes, see critical care procedure note(s)  .1-3 Lead EKG Interpretation  Performed by: Concha Se, MD Authorized by: Concha Se, MD     Interpretation: abnormal     ECG rate:  70   ECG rate assessment: normal     Rhythm: sinus rhythm  Ectopy: none     Conduction: normal   .Critical Care  Performed by: Vanessa Tioga, MD Authorized by: Vanessa [place], MD   Critical care provider statement:    Critical care time (minutes):  30   Critical care was necessary to treat or prevent imminent or life-threatening deterioration of the following conditions:  Respiratory failure   Critical care was time spent personally by me on the following activities:  Development of treatment plan with patient or surrogate, discussions with consultants, evaluation of patient's response to treatment, examination of patient, ordering and review of laboratory studies, ordering and review of radiographic studies, ordering and performing treatments and interventions, pulse oximetry, re-evaluation of patient's condition and review of old charts    [place] ED: Medications - No data to display   IMPRESSION / MDM / [place] / ED COURSE  I reviewed the  triage vital signs and the nursing notes.   Patient's presentation is most consistent with acute presentation with potential threat to life or bodily function.   Patient came in with respiratory distress initially trialed on BiPAP but not tolerating therefore it was removed.  Oxygen levels are staying between 9094% currently.  I am concerned about CHF given some edema noted in his legs.  Patient has no history of PEs unilateral leg swelling or coughing up blood to suggest pulmonary embolism.  Will test for COVID, flu.  Consider the possibility of pneumonia  Patient's white count is slightly elevated but does not meet sepsis criteria CMP is reassuring with normal potassium. Troponin reassuring.  Chest x-ray with some cardiomegaly and pulmonary vascular congestion we will give a dose of Lasix.  Will discuss possible team for admission due to concern for new onset CHF versus viral illness versus.   The patient is on the cardiac monitor to evaluate for evidence of arrhythmia and/or significant heart rate changes.      FINAL CLINICAL IMPRESSION(S) / ED DIAGNOSES   Final diagnoses:  Acute congestive heart failure, unspecified heart failure type ([facility])  Acute respiratory failure with hypoxia ([place])     Rx / DC Orders   ED Discharge Orders     None        Note:  This document was prepared using Dragon voice recognition software and may include unintentional dictation errors.   Vanessa [place], MD 08/13/22 2238

## 2022-08-13 NOTE — Assessment & Plan Note (Addendum)
-   I Suspect diastolic etiology. - The patient will be admitted to a cardiac telemetry bed. - This could be secondary to viral myocarditis from recent URI as well as hypertensive urgency. - We will continue diuresis with IV Lasix. - We will follow serial troponins. - We will obtain a 2D echo and cardiology consult. - I notified Dr. Humphrey Rolls about the patient.

## 2022-08-13 NOTE — ED Triage Notes (Signed)
Patient arrives EMS from home. Patient was seen for upper respirator infection last week and given albuterol, zpack, and prednisone. Today the patient became short of breath and diaphoretic when walking to his car. EMS reports wheezing in all lung fields on scene. EMS gave 3 duonebs, albuterol, and 125mg  solumedrol. Patient arrives on 8L of oxygen. Hx of undiagnosed hypertension.

## 2022-08-13 NOTE — Assessment & Plan Note (Signed)
-   The patient had initial hypoxia for EMS requiring 8 L of O2 by nasal cannula however he was satting well on room air. - O2 protocol will be followed.

## 2022-08-14 ENCOUNTER — Encounter: Payer: Self-pay | Admitting: Family Medicine

## 2022-08-14 ENCOUNTER — Inpatient Hospital Stay (HOSPITAL_COMMUNITY)
Admit: 2022-08-14 | Discharge: 2022-08-14 | Disposition: A | Discharge status: Home / Self Care | Payer: Self-pay | Attending: Family Medicine | Admitting: Family Medicine

## 2022-08-14 DIAGNOSIS — I5031 Acute diastolic (congestive) heart failure: Secondary | ICD-10-CM

## 2022-08-14 DIAGNOSIS — J069 Acute upper respiratory infection, unspecified: Secondary | ICD-10-CM

## 2022-08-14 LAB — PROCALCITONIN: Procalcitonin: <0.1 ng/mL

## 2022-08-14 LAB — BASIC METABOLIC PANEL
Anion gap: 8 (ref 5–15)
BUN: 13 mg/dL (ref 6–20)
CO2: 26 mmol/L (ref 22–32)
Calcium: 9 mg/dL (ref 8.9–10.3)
Chloride: 102 mmol/L (ref 98–111)
Creatinine, Ser: 0.63 mg/dL (ref 0.61–1.24)
GFR, Estimated: >60 mL/min (ref >60)
Glucose, Bld: 191 mg/dL — ABNORMAL HIGH (ref 70–99)
Potassium: 4 mmol/L (ref 3.5–5.1)
Sodium: 136 mmol/L (ref 135–145)

## 2022-08-14 LAB — CBC
HCT: 48.2 % (ref 39.0–52.0)
Hemoglobin: 15.6 g/dL (ref 13.0–17.0)
MCH: 27.5 pg (ref 26.0–34.0)
MCHC: 32.4 g/dL (ref 30.0–36.0)
MCV: 85 fL (ref 80.0–100.0)
Platelets: 351 10*3/uL (ref 150–400)
RBC: 5.67 MIL/uL (ref 4.22–5.81)
RDW: 12.9 % (ref 11.5–15.5)
WBC: 10.4 10*3/uL (ref 4.0–10.5)
nRBC: 0 % (ref 0.0–0.2)

## 2022-08-14 LAB — ECHOCARDIOGRAM COMPLETE
Height: 67 in
S' Lateral: 3.2 cm
Weight: 7472 oz

## 2022-08-14 LAB — HEMOGLOBIN A1C
Hgb A1c MFr Bld: 5.5 % (ref 4.8–5.6)
Mean Plasma Glucose: 111.15 mg/dL

## 2022-08-14 LAB — D-DIMER, QUANTITATIVE: D-Dimer, Quant: 0.46 ug/mL-FEU (ref 0.00–0.50)

## 2022-08-14 LAB — TROPONIN I (HIGH SENSITIVITY)
Troponin I (High Sensitivity): 12 ng/L (ref <18)
Troponin I (High Sensitivity): 9 ng/L (ref <18)

## 2022-08-14 LAB — HIV ANTIBODY (ROUTINE TESTING W REFLEX): HIV Screen 4th Generation wRfx: NONREACTIVE

## 2022-08-14 LAB — BRAIN NATRIURETIC PEPTIDE: B Natriuretic Peptide: 22.8 pg/mL (ref 0.0–100.0)

## 2022-08-14 MED ORDER — LISINOPRIL 10 MG PO TABS: 20.0000 mg | ORAL_TABLET | Freq: Every day | ORAL | Status: DC

## 2022-08-14 MED ORDER — HYDROCHLOROTHIAZIDE 12.5 MG PO TABS: 12.5000 mg | ORAL_TABLET | Freq: Every day | ORAL | 0 refills | Status: DC

## 2022-08-14 MED ORDER — LISINOPRIL 20 MG PO TABS: 20.0000 mg | ORAL_TABLET | Freq: Every day | ORAL | 0 refills | Status: DC

## 2022-08-14 MED ORDER — POTASSIUM CHLORIDE 20 MEQ PO PACK
40.0000 meq | PACK | Freq: Once | ORAL | Status: AC
  Administered 2022-08-14: 40 meq via ORAL
  Filled 2022-08-14: qty 2

## 2022-08-14 MED ORDER — LABETALOL HCL 5 MG/ML IV SOLN
20.0000 mg | INTRAVENOUS | Status: DC | PRN
  Administered 2022-08-14: 20 mg via INTRAVENOUS
  Filled 2022-08-14: qty 4

## 2022-08-14 MED ORDER — LISINOPRIL 10 MG PO TABS
20.0000 mg | ORAL_TABLET | Freq: Every day | ORAL | Status: DC
  Administered 2022-08-14 (×2): 20 mg via ORAL
  Filled 2022-08-14 (×2): qty 2

## 2022-08-14 MED ORDER — ALBUTEROL SULFATE (2.5 MG/3ML) 0.083% IN NEBU
2.5000 mg | INHALATION_SOLUTION | Freq: Four times a day (QID) | RESPIRATORY_TRACT | Status: DC
  Administered 2022-08-14 (×2): 2.5 mg via RESPIRATORY_TRACT
  Filled 2022-08-14: qty 3

## 2022-08-14 MED ORDER — HYDRALAZINE HCL 20 MG/ML IJ SOLN
10.0000 mg | Freq: Four times a day (QID) | INTRAMUSCULAR | Status: DC | PRN
  Administered 2022-08-14: 10 mg via INTRAVENOUS
  Filled 2022-08-14: qty 1

## 2022-08-14 MED ORDER — HYDROCHLOROTHIAZIDE 12.5 MG PO TABS
12.5000 mg | ORAL_TABLET | Freq: Every day | ORAL | Status: DC
  Administered 2022-08-14: 12.5 mg via ORAL
  Filled 2022-08-14: qty 1

## 2022-08-14 NOTE — Discharge Summary (Addendum)
Christopher Keith FUX:[id number] DOB: 07-Nov-1985 DOA: 08/13/2022  PCP: Patient, No Pcp Per  Admit date: 08/13/2022 Discharge date: 08/14/2022  Time spent: 35 minutes  Recommendations for Outpatient Follow-up:  Establish with pcp  F/u A1c (pending at time of discharge)    Discharge Diagnoses:  Principal Problem:   Acute CHF (congestive heart failure) (HCC) Active Problems:   Hypertensive urgency   Acute respiratory failure with hypoxia (HCC)   Tobacco use   Hypertension   Morbid obesity with BMI of 60.0-69.9, adult [facility])   Discharge Condition: improved  Diet recommendation: heart healthy  Filed Weights   08/13/22 2150  Weight: (!) 211.8 kg    History of present illness:  From admission h and p Christopher Keith is a 36 y.o. Hispanic American male with medical history significant for obesity and hypertension, who presented to the emergency room with acute onset of acute respiratory distress with dyspnea and initial hypoxia requiring 8 L of O2 by nasal cannula by EMS.  He admitted to orthopnea and paroxysmal nocturnal dyspnea as well as bilateral lower extremity edema and recent cough productive of green and yellow sputum as well as wheezing.  He denies any fever or chills.  He also had rhinorrhea nasal congestion without sore throat last week and was prescribed Zithromax prednisone and albuterol for acute bronchitis.  He has not taken his blood pressure medications for a couple of years as he was not able to afford seeing his primary care physician.  He was apparently on Lisinopril/HCT 20/25 mg p.o. daily.  He denied any dysuria, oliguria or hematuria or flank pain.  No nausea or vomiting or abdominal pain.  No chest pain or palpitations.  No re   Hospital Course:  Patient presents with worseing respiratory distress, uri symptoms, and hypoxia. Treated as outpt with azithromycin and steroid course. Here initially required O2 but weaned off that. There was initial concern for chf exacerbation and  patient was given lasix but bnp normal and so was echocardiogram. Cxr without infiltrate, procal low, no fever, do not think pneumonia. Denies history of asthma but is wheezing and responded to albuterol so will advise continue albuterol as outpt. Glucose elevated, A1c pending. D dimer also wnl, thus PE unlikely. Covid and flu negative. Serial troponins negative. This appears to be a viral upper respiratory illness that has provoked reactive airway disease. Of note bp also elevated, patient not taking bp meds at home but says previously prescribed lisinopril and hctz, I will prescribe those.  Procedures: none   Consultations: cardiology  Discharge Exam: Vitals:   08/14/22 1549 08/14/22 1549  BP:  (!) 163/84  Pulse: 68 65  Resp:  20  Temp:  97.9 F (36.6 C)  SpO2: 95% 95%    General: NAD Cardiovascular: RRR Respiratory: exp wheeze  Discharge Instructions   Discharge Instructions     Diet - low sodium heart healthy   Complete by: As directed    Increase activity slowly   Complete by: As directed       Allergies as of 08/14/2022   No Known Allergies      Medication List     STOP taking these medications    azithromycin 250 MG tablet Commonly known as: ZITHROMAX   predniSONE 20 MG tablet Commonly known as: DELTASONE       TAKE these medications    albuterol 108 (90 Base) MCG/ACT inhaler Commonly known as: VENTOLIN HFA Inhale 2 puffs into the lungs every 6 (six) hours as needed for  wheezing or shortness of breath.   hydrochlorothiazide 12.5 MG tablet Commonly known as: HYDRODIURIL Take 1 tablet (12.5 mg total) by mouth daily. Start taking on: August 15, 2022   lisinopril 20 MG tablet Commonly known as: ZESTRIL Take 1 tablet (20 mg total) by mouth daily. Start taking on: August 15, 2022       No Known Allergies  Follow-up Information     [facility] Follow up.   Contact information: [address] [place]  [address] [phone]                  The results of significant diagnostics from this hospitalization (including imaging, microbiology, ancillary and laboratory) are listed below for reference.    Significant Diagnostic Studies: ECHOCARDIOGRAM COMPLETE  Result Date: 08/14/2022    ECHOCARDIOGRAM REPORT   Patient Name:   Christopher Keith  Date of Exam: 08/14/2022 Medical Rec #:  [id number]  Height:       67.0 in Accession #:    [id number] Weight:       467.0 lb Date of Birth:  Feb 23, 1986  BSA:          2.900 m Patient Age:    36 years   BP:           180/84 mmHg Patient Gender: M          HR:           66 bpm. Exam Location:  [facility] Procedure: 2D Echo, Cardiac Doppler and Color Doppler Indications:     CHF-acute diastolic I50.31  History:         Patient has no prior history of Echocardiogram examinations.                  Risk Factors:Hypertension.  Sonographer:     Cristela Blue Referring Phys:  [id number] JAN A MANSY Diagnosing Phys: Debbe Odea MD  Sonographer Comments: Technically challenging study due to limited acoustic windows, no apical window and no subcostal window. IMPRESSIONS  1. Left ventricular ejection fraction, by estimation, is 65 to 70%. The left ventricle has normal function. The left ventricle has no regional wall motion abnormalities. There is mild left ventricular hypertrophy. Left ventricular diastolic function could not be evaluated.  2. Right ventricular systolic function is normal. The right ventricular size is not well visualized.  3. The mitral valve is normal in structure. No evidence of mitral valve regurgitation.  4. The aortic valve was not well visualized. Aortic valve regurgitation is not visualized.  5. The inferior vena cava is normal in size with greater than 50% respiratory variability, suggesting right atrial pressure of 3 mmHg. FINDINGS  Left Ventricle: Left ventricular ejection fraction, by estimation, is 65 to 70%. The left ventricle has normal function. The left  ventricle has no regional wall motion abnormalities. The left ventricular internal cavity size was normal in size. There is  mild left ventricular hypertrophy. Left ventricular diastolic function could not be evaluated. Right Ventricle: The right ventricular size is not well visualized. No increase in right ventricular wall thickness. Right ventricular systolic function is normal. Left Atrium: Left atrial size was normal in size. Right Atrium: Right atrial size was not well visualized. Pericardium: There is no evidence of pericardial effusion. Mitral Valve: The mitral valve is normal in structure. No evidence of mitral valve regurgitation. Tricuspid Valve: The tricuspid valve is not well visualized. Tricuspid valve regurgitation is not demonstrated. Aortic Valve: The aortic valve was not well visualized. Aortic  valve regurgitation is not visualized. Pulmonic Valve: The pulmonic valve was not well visualized. Pulmonic valve regurgitation is not visualized. Aorta: The aortic root is normal in size and structure. Venous: The inferior vena cava is normal in size with greater than 50% respiratory variability, suggesting right atrial pressure of 3 mmHg. IAS/Shunts: No atrial level shunt detected by color flow Doppler.  LEFT VENTRICLE PLAX 2D LVIDd:         5.30 cm LVIDs:         3.20 cm LV PW:         1.40 cm LV IVS:        1.30 cm LVOT diam:     2.00 cm LVOT Area:     3.14 cm  LEFT ATRIUM         Index LA diam:    4.00 cm 1.38 cm/m   AORTA Ao Root diam: 2.90 cm  SHUNTS Systemic Diam: 2.00 cm Debbe Odea MD Electronically signed by Debbe Odea MD Signature Date/Time: 08/14/2022/3:13:35 PM    Final    DG Chest Portable 1 View  Result Date: 08/13/2022 CLINICAL DATA:  Shortness of breath and diaphoresis. EXAM: PORTABLE CHEST 1 VIEW COMPARISON:  None Available. FINDINGS: The cardiac silhouette is mildly enlarged. There is mild prominence of the perihilar pulmonary vasculature. Mild atelectasis is seen within  the left lung base. There is no evidence of a pleural effusion or pneumothorax. The visualized skeletal structures are unremarkable. IMPRESSION: 1. Cardiomegaly with mild pulmonary vascular congestion. 2. Mild left basilar atelectasis. Electronically Signed   By: Aram Candela M.D.   On: 08/13/2022 22:30    Microbiology: Recent Results (from the past 240 hour(s))  Resp Panel by RT-PCR (Flu A&B, Covid) Anterior Nasal Swab     Status: None   Collection Time: 08/13/22 10:02 PM   Specimen: Anterior Nasal Swab  Result Value Ref Range Status   SARS Coronavirus 2 by RT PCR NEGATIVE NEGATIVE Final    Comment: (NOTE) SARS-CoV-2 target nucleic acids are NOT DETECTED.  The SARS-CoV-2 RNA is generally detectable in upper respiratory specimens during the acute phase of infection. The lowest concentration of SARS-CoV-2 viral copies this assay can detect is 138 copies/mL. A negative result does not preclude SARS-Cov-2 infection and should not be used as the sole basis for treatment or other patient management decisions. A negative result may occur with  improper specimen collection/handling, submission of specimen other than nasopharyngeal swab, presence of viral mutation(s) within the areas targeted by this assay, and inadequate number of viral copies(<138 copies/mL). A negative result must be combined with clinical observations, patient history, and epidemiological information. The expected result is Negative.  Fact Sheet for Patients:  BloggerCourse.com  Fact Sheet for Healthcare Providers:  SeriousBroker.it  This test is no t yet approved or cleared by the [place] FDA and  has been authorized for detection and/or diagnosis of SARS-CoV-2 by FDA under an Emergency Use Authorization (EUA). This EUA will remain  in effect (meaning this test can be used) for the duration of the COVID-19 declaration under Section 564(b)(1) of the Act,  21 U.S.C.section 360bbb-3(b)(1), unless the authorization is terminated  or revoked sooner.       Influenza A by PCR NEGATIVE NEGATIVE Final   Influenza B by PCR NEGATIVE NEGATIVE Final    Comment: (NOTE) The Xpert Xpress SARS-CoV-2/FLU/RSV plus assay is intended as an aid in the diagnosis of influenza from Nasopharyngeal swab specimens and should not be used as a sole  basis for treatment. Nasal washings and aspirates are unacceptable for Xpert Xpress SARS-CoV-2/FLU/RSV testing.  Fact Sheet for Patients: BloggerCourse.com  Fact Sheet for Healthcare Providers: SeriousBroker.it  This test is not yet approved or cleared by the [place] FDA and has been authorized for detection and/or diagnosis of SARS-CoV-2 by FDA under an Emergency Use Authorization (EUA). This EUA will remain in effect (meaning this test can be used) for the duration of the COVID-19 declaration under Section 564(b)(1) of the Act, 21 U.S.C. section 360bbb-3(b)(1), unless the authorization is terminated or revoked.  Performed at [facility], [address] [place], [place] [address]      Labs: Basic Metabolic Panel: Recent Labs  Lab 08/13/22 2202 08/14/22 0317  NA 140 136  K 3.5 4.0  CL 104 102  CO2 27 26  GLUCOSE 150* 191*  BUN 15 13  CREATININE 0.63 0.63  CALCIUM 8.8* 9.0   Liver Function Tests: Recent Labs  Lab 08/13/22 2202  AST 24  ALT 27  ALKPHOS 74  BILITOT 0.7  PROT 7.4  ALBUMIN 3.4*   No results for input(s): "LIPASE", "AMYLASE" in the last 168 hours. No results for input(s): "AMMONIA" in the last 168 hours. CBC: Recent Labs  Lab 08/13/22 2202 08/14/22 0317  WBC 11.0* 10.4  HGB 14.9 15.6  HCT 45.9 48.2  MCV 85.5 85.0  PLT 326 351   Cardiac Enzymes: No results for input(s): "CKTOTAL", "CKMB", "CKMBINDEX", "TROPONINI" in the last 168 hours. BNP: BNP (last 3 results) Recent Labs    08/13/22 2202  08/14/22 0317  BNP 17.9 22.8    ProBNP (last 3 results) No results for input(s): "PROBNP" in the last 8760 hours.  CBG: No results for input(s): "GLUCAP" in the last 168 hours.     Signed:  Silvano Bilis MD.  [facility] 08/14/2022, 4:23 PM

## 2022-08-14 NOTE — ED Notes (Signed)
Patient states when he lays down he becomes short of breath. Patient was placed on 3L [place]. Called respiratory therapy for a CPAP.

## 2022-08-14 NOTE — Progress Notes (Addendum)
Nutrition Brief Note  RD pulled to chart secondary to CHF.   Wt Readings from Last 15 Encounters:  08/13/22 (!) 211.8 kg  07/19/20 (!) 193.4 kg  02/23/19 (!) 209.8 kg  02/22/19 (!) 209.8 kg   Pt with medical history significant for obesity and hypertension, who presented with acute onset of acute respiratory distress with dyspnea.   Pt admitted with new onset CHF.   RD provided "Low Sodium Nutrition Therapy" handout from [facility] Nutrition Care Manual; attached to AVS/ Discharge summary.   Body mass index is 73.14 kg/m. Patient meets criteria for obesity, class III based on current BMI. Obesity is a complex, chronic medical condition that is optimally managed by a multidisciplinary care team. Weight loss is not an ideal goal for an acute inpatient hospitalization. However, if further work-up for obesity is warranted, consider outpatient referral to outpatient bariatric service and/or [place] [facility] Diabetes Education [facility].    Current diet order is Heart Healthy (liberalized to 2 gram sodium), patient is consuming approximately n/a% of meals at this time. Labs and medications reviewed.   No nutrition interventions warranted at this time. If nutrition issues arise, please consult RD.   Christopher Keith, RD, LDN, CDCES Registered Dietitian II Certified Diabetes Care and Education Specialist Please refer to [facility] for RD and/or RD on-call/weekend/after hours pager

## 2022-08-14 NOTE — Progress Notes (Signed)
Entered patient's room with CPAP machine. Patient states he is breathing fine with the nasal cannula and states he was able to get some sleep. I asked patient if he wanted me to leave hospital unit in room, patient states no but he may attempt to wear a unit tonight. No distress noted.

## 2022-08-14 NOTE — Progress Notes (Signed)
*  PRELIMINARY RESULTS* Echocardiogram 2D Echocardiogram has been performed.  Christopher Keith 08/14/2022, 10:11 AM

## 2022-08-14 NOTE — ED Notes (Signed)
Pt alert, on cell phone.   Family with pt.  No acute distress.

## 2022-08-14 NOTE — Discharge Instructions (Signed)

## 2022-08-14 NOTE — Consult Note (Signed)
Christopher Keith is a 36 y.o. male  [id number]  Primary Cardiologist: Adrian Blackwater, MD Reason for Consultation: acute onset CHF  HPI: From ED HPI Christopher Keith is a 36 y.o. Hispanic American male with medical history significant for obesity and hypertension, who presented to the emergency room with acute onset of acute respiratory distress with dyspnea and initial hypoxia requiring 8 L of O2 by nasal cannula by EMS.  He admitted to orthopnea and paroxysmal nocturnal dyspnea as well as bilateral lower extremity edema and recent cough productive of green and yellow sputum as well as wheezing.  He denies any fever or chills.  He also had rhinorrhea nasal congestion without sore throat last week and was prescribed Zithromax prednisone and albuterol for acute bronchitis.  He has not taken his blood pressure medications for a couple of years as he was not able to afford seeing his primary care physician.  He was apparently on Lisinopril/HCT 20/25 mg p.o. daily.  He denied any dysuria, oliguria or hematuria or flank pain.  No nausea or vomiting or abdominal pain.  No chest pain or palpitations.  No recent travels or surgeries.  He denies any leg pain.    Review of Systems: denies chest pain. Shortness of breath improved on 3 L O2 via [place]. Lower extremity edema improved   Past Medical History:  Diagnosis Date   Hypertension    MRSA carrier    Obesity     (Not in a hospital admission)     enoxaparin (LOVENOX) injection  0.5 mg/kg Subcutaneous Q24H   furosemide  40 mg Intravenous BID   hydrochlorothiazide  12.5 mg Oral Daily   lisinopril  20 mg Oral Daily    Infusions:   No Known Allergies  Social History   Socioeconomic History   Marital status: Married    Spouse name: Not on file   Number of children: Not on file   Years of education: Not on file   Highest education level: Not on file  Occupational History   Not on file  Tobacco Use   Smoking status: Some Days    Types: Cigarettes    Smokeless tobacco: Never  Substance and Sexual Activity   Alcohol use: Yes   Drug use: Never   Sexual activity: Not on file  Other Topics Concern   Not on file  Social History Narrative   Not on file   Social Determinants of Health   Financial Resource Strain: Not on file  Food Insecurity: Not on file  Transportation Needs: Not on file  Physical Activity: Not on file  Stress: Not on file  Social Connections: Not on file  Intimate Partner Violence: Not on file    Family History  Problem Relation Age of Onset   Gastric cancer Mother    Alcohol abuse Father    Obesity Father    Diabetes Mellitus II Brother     PHYSICAL EXAM: Vitals:   08/14/22 0800 08/14/22 0802  BP: (!) 180/84   Pulse: 66   Resp: (!) 24   Temp:  97.8 F (36.6 C)  SpO2: 93%     No intake or output data in the 24 hours ending 08/14/22 0859  General:  Well appearing. No respiratory difficulty HEENT: normal Neck: supple. no JVD. Carotids 2+ bilat; no bruits. No lymphadenopathy or thryomegaly appreciated. Cor: PMI nondisplaced. Regular rate & rhythm. No rubs, gallops or murmurs. Lungs: clear Abdomen: soft, nontender, nondistended. No hepatosplenomegaly. No bruits or masses. Good bowel sounds. Extremities:  no cyanosis, clubbing, rash, edema Neuro: alert & oriented x 3, cranial nerves grossly intact. moves all 4 extremities w/o difficulty. Affect pleasant.  ECG: sinus rhythm, 81 bpm, LBBB, non-specific ST changes  Results for orders placed or performed during the hospital encounter of 08/13/22 (from the past 24 hour(s))  Resp Panel by RT-PCR (Flu A&B, Covid) Anterior Nasal Swab     Status: None   Collection Time: 08/13/22 10:02 PM   Specimen: Anterior Nasal Swab  Result Value Ref Range   SARS Coronavirus 2 by RT PCR NEGATIVE NEGATIVE   Influenza A by PCR NEGATIVE NEGATIVE   Influenza B by PCR NEGATIVE NEGATIVE  CBC     Status: Abnormal   Collection Time: 08/13/22 10:02 PM  Result Value Ref Range    WBC 11.0 (H) 4.0 - 10.5 K/uL   RBC 5.37 4.22 - 5.81 MIL/uL   Hemoglobin 14.9 13.0 - 17.0 g/dL   HCT 61.6 07.3 - 71.0 %   MCV 85.5 80.0 - 100.0 fL   MCH 27.7 26.0 - 34.0 pg   MCHC 32.5 30.0 - 36.0 g/dL   RDW 62.6 94.8 - 54.6 %   Platelets 326 150 - 400 K/uL   nRBC 0.0 0.0 - 0.2 %  Comprehensive metabolic panel     Status: Abnormal   Collection Time: 08/13/22 10:02 PM  Result Value Ref Range   Sodium 140 135 - 145 mmol/L   Potassium 3.5 3.5 - 5.1 mmol/L   Chloride 104 98 - 111 mmol/L   CO2 27 22 - 32 mmol/L   Glucose, Bld 150 (H) 70 - 99 mg/dL   BUN 15 6 - 20 mg/dL   Creatinine, Ser 2.70 0.61 - 1.24 mg/dL   Calcium 8.8 (L) 8.9 - 10.3 mg/dL   Total Protein 7.4 6.5 - 8.1 g/dL   Albumin 3.4 (L) 3.5 - 5.0 g/dL   AST 24 15 - 41 U/L   ALT 27 0 - 44 U/L   Alkaline Phosphatase 74 38 - 126 U/L   Total Bilirubin 0.7 0.3 - 1.2 mg/dL   GFR, Estimated >35 >00 mL/min   Anion gap 9 5 - 15  Troponin I (High Sensitivity)     Status: None   Collection Time: 08/13/22 10:02 PM  Result Value Ref Range   Troponin I (High Sensitivity) 9 <18 ng/L  Brain natriuretic peptide     Status: None   Collection Time: 08/13/22 10:02 PM  Result Value Ref Range   B Natriuretic Peptide 17.9 0.0 - 100.0 pg/mL  Troponin I (High Sensitivity)     Status: None   Collection Time: 08/13/22 11:31 PM  Result Value Ref Range   Troponin I (High Sensitivity) 12 <18 ng/L  Basic metabolic panel     Status: Abnormal   Collection Time: 08/14/22  3:17 AM  Result Value Ref Range   Sodium 136 135 - 145 mmol/L   Potassium 4.0 3.5 - 5.1 mmol/L   Chloride 102 98 - 111 mmol/L   CO2 26 22 - 32 mmol/L   Glucose, Bld 191 (H) 70 - 99 mg/dL   BUN 13 6 - 20 mg/dL   Creatinine, Ser 9.38 0.61 - 1.24 mg/dL   Calcium 9.0 8.9 - 18.2 mg/dL   GFR, Estimated >99 >37 mL/min   Anion gap 8 5 - 15  CBC     Status: None   Collection Time: 08/14/22  3:17 AM  Result Value Ref Range   WBC 10.4 4.0 - 10.5  K/uL   RBC 5.67 4.22 - 5.81 MIL/uL    Hemoglobin 15.6 13.0 - 17.0 g/dL   HCT 76.2 83.1 - 51.7 %   MCV 85.0 80.0 - 100.0 fL   MCH 27.5 26.0 - 34.0 pg   MCHC 32.4 30.0 - 36.0 g/dL   RDW 61.6 07.3 - 71.0 %   Platelets 351 150 - 400 K/uL   nRBC 0.0 0.0 - 0.2 %  Brain natriuretic peptide     Status: None   Collection Time: 08/14/22  3:17 AM  Result Value Ref Range   B Natriuretic Peptide 22.8 0.0 - 100.0 pg/mL  Troponin I (High Sensitivity)     Status: None   Collection Time: 08/14/22  3:17 AM  Result Value Ref Range   Troponin I (High Sensitivity) 9 <18 ng/L   DG Chest Portable 1 View  Result Date: 08/13/2022 CLINICAL DATA:  Shortness of breath and diaphoresis. EXAM: PORTABLE CHEST 1 VIEW COMPARISON:  None Available. FINDINGS: The cardiac silhouette is mildly enlarged. There is mild prominence of the perihilar pulmonary vasculature. Mild atelectasis is seen within the left lung base. There is no evidence of a pleural effusion or pneumothorax. The visualized skeletal structures are unremarkable. IMPRESSION: 1. Cardiomegaly with mild pulmonary vascular congestion. 2. Mild left basilar atelectasis. Electronically Signed   By: Aram Candela M.D.   On: 08/13/2022 22:30     ASSESSMENT AND PLAN: Patient sitting on side of bed eating breakfast. Denies chest pain. Shortness of breath I proved. B/p improving. Echo results pending. Continue IV Lasix. Will continue to follow.  Museum/gallery conservator FNP-C

## 2022-08-14 NOTE — ED Notes (Signed)
Patient is resting in hospital bed. Son is resting in recliner. No needs at this time.

## 2022-08-14 NOTE — ED Notes (Signed)
Lab called. They need blue top and lavendar tubes sent for AM labs which were ordered as add ons.

## 2022-08-14 NOTE — ED Notes (Signed)
Went to give morning lasix. Pt and son both asleep. Pt has unlabored respirations, SPO2 93% on 3L nasal cannula which was placed for sleeping (cpap machine not available, night RN called RT). Will allow pt to sleep then give lasix.

## 2022-08-15 ENCOUNTER — Emergency Department: Payer: Self-pay

## 2022-08-15 ENCOUNTER — Inpatient Hospital Stay
Admission: EM | Admit: 2022-08-15 | Discharge: 2022-08-18 | DRG: 291 | Disposition: A | Discharge status: Home / Self Care | Payer: Self-pay | Attending: Obstetrics and Gynecology | Admitting: Obstetrics and Gynecology

## 2022-08-15 DIAGNOSIS — R0603 Acute respiratory distress: Secondary | ICD-10-CM

## 2022-08-15 DIAGNOSIS — I161 Hypertensive emergency: Secondary | ICD-10-CM | POA: Diagnosis present

## 2022-08-15 DIAGNOSIS — J209 Acute bronchitis, unspecified: Secondary | ICD-10-CM | POA: Diagnosis present

## 2022-08-15 DIAGNOSIS — Z833 Family history of diabetes mellitus: Secondary | ICD-10-CM

## 2022-08-15 DIAGNOSIS — I452 Bifascicular block: Secondary | ICD-10-CM | POA: Diagnosis present

## 2022-08-15 DIAGNOSIS — I110 Hypertensive heart disease with heart failure: Principal | ICD-10-CM | POA: Diagnosis present

## 2022-08-15 DIAGNOSIS — J9602 Acute respiratory failure with hypercapnia: Secondary | ICD-10-CM | POA: Diagnosis not present

## 2022-08-15 DIAGNOSIS — Z720 Tobacco use: Secondary | ICD-10-CM | POA: Diagnosis present

## 2022-08-15 DIAGNOSIS — I10 Essential (primary) hypertension: Secondary | ICD-10-CM | POA: Diagnosis present

## 2022-08-15 DIAGNOSIS — J440 Chronic obstructive pulmonary disease with acute lower respiratory infection: Secondary | ICD-10-CM | POA: Diagnosis present

## 2022-08-15 DIAGNOSIS — I5031 Acute diastolic (congestive) heart failure: Secondary | ICD-10-CM | POA: Diagnosis present

## 2022-08-15 DIAGNOSIS — U070 Vaping-related disorder: Secondary | ICD-10-CM | POA: Diagnosis present

## 2022-08-15 DIAGNOSIS — J9601 Acute respiratory failure with hypoxia: Secondary | ICD-10-CM | POA: Diagnosis present

## 2022-08-15 DIAGNOSIS — I160 Hypertensive urgency: Principal | ICD-10-CM

## 2022-08-15 DIAGNOSIS — Z6841 Body Mass Index (BMI) 40.0 and over, adult: Secondary | ICD-10-CM

## 2022-08-15 DIAGNOSIS — Z1152 Encounter for screening for COVID-19: Secondary | ICD-10-CM

## 2022-08-15 DIAGNOSIS — F17210 Nicotine dependence, cigarettes, uncomplicated: Secondary | ICD-10-CM | POA: Diagnosis present

## 2022-08-15 DIAGNOSIS — I509 Heart failure, unspecified: Secondary | ICD-10-CM

## 2022-08-15 LAB — CBC WITH DIFFERENTIAL/PLATELET
Abs Immature Granulocytes: 0.05 10*3/uL (ref 0.00–0.07)
Basophils Absolute: 0.1 10*3/uL (ref 0.0–0.1)
Basophils Relative: 1 %
Eosinophils Absolute: 0.7 10*3/uL — ABNORMAL HIGH (ref 0.0–0.5)
Eosinophils Relative: 5 %
HCT: 50.4 % (ref 39.0–52.0)
Hemoglobin: 16 g/dL (ref 13.0–17.0)
Immature Granulocytes: 0 %
Lymphocytes Relative: 26 %
Lymphs Abs: 3.7 10*3/uL (ref 0.7–4.0)
MCH: 27.4 pg (ref 26.0–34.0)
MCHC: 31.7 g/dL (ref 30.0–36.0)
MCV: 86.4 fL (ref 80.0–100.0)
Monocytes Absolute: 1.2 10*3/uL — ABNORMAL HIGH (ref 0.1–1.0)
Monocytes Relative: 9 %
Neutro Abs: 8.5 10*3/uL — ABNORMAL HIGH (ref 1.7–7.7)
Neutrophils Relative %: 59 %
Platelets: 384 10*3/uL (ref 150–400)
RBC: 5.83 MIL/uL — ABNORMAL HIGH (ref 4.22–5.81)
RDW: 13.5 % (ref 11.5–15.5)
WBC: 14.2 10*3/uL — ABNORMAL HIGH (ref 4.0–10.5)
nRBC: 0 % (ref 0.0–0.2)

## 2022-08-15 MED ORDER — NITROGLYCERIN 2 % TD OINT
1.0000 [in_us] | TOPICAL_OINTMENT | Freq: Once | TRANSDERMAL | Status: AC
  Administered 2022-08-15: 1 [in_us] via TOPICAL
  Filled 2022-08-15: qty 1

## 2022-08-15 MED ORDER — METHYLPREDNISOLONE SODIUM SUCC 125 MG IJ SOLR
125.0000 mg | Freq: Once | INTRAMUSCULAR | Status: AC
  Administered 2022-08-15: 125 mg via INTRAVENOUS
  Filled 2022-08-15: qty 2

## 2022-08-15 MED ORDER — MORPHINE SULFATE (PF) 4 MG/ML IV SOLN
4.0000 mg | Freq: Once | INTRAVENOUS | Status: AC
  Administered 2022-08-15: 4 mg via INTRAVENOUS
  Filled 2022-08-15: qty 1

## 2022-08-15 MED ORDER — ASPIRIN 81 MG PO CHEW
324.0000 mg | CHEWABLE_TABLET | Freq: Once | ORAL | Status: AC
  Administered 2022-08-15: 324 mg via ORAL
  Filled 2022-08-15: qty 4

## 2022-08-15 MED ORDER — FUROSEMIDE 10 MG/ML IJ SOLN
40.0000 mg | Freq: Once | INTRAMUSCULAR | Status: AC
  Administered 2022-08-16: 40 mg via INTRAVENOUS
  Filled 2022-08-15: qty 4

## 2022-08-15 NOTE — ED Triage Notes (Signed)
Pt here via EMS from home C/C respiratory distress r/t CHF exacerbation. Pt denies dizziness, chest pain but endorses heaviness. Pt was in ED on 11/8 for the same and was weened off of oxygen and send home to manage symptoms outpatient.

## 2022-08-15 NOTE — ED Provider Notes (Signed)
[facility] Provider Note    Event Date/Time   First MD Initiated Contact with Patient 08/15/22 2306     (approximate)   History  Respiratory distress    HPI  Christopher Keith is a 36 y.o. male brought to the ED via EMS from home with a chief complaint of respiratory distress.  Patient with a past medical history of morbid obesity, hypertension, recent hospitalization for acute CHF who returns with shortness of breath today.  EMS reports room air saturation 88%.  Endorses associated chest tightness.  Denies fever, cough, abdominal pain, nausea, vomiting or dizziness.  Does not wear oxygen at baseline.     Past Medical History   Past Medical History:  Diagnosis Date   Hypertension    MRSA carrier    Obesity      Active Problem List   Patient Active Problem List   Diagnosis Date Noted   Acute respiratory failure with hypoxia (HCC) 08/13/2022   Acute CHF (congestive heart failure) (HCC) 08/13/2022   Hypertensive urgency 08/13/2022   Tobacco use 08/13/2022   Acute bronchitis due to respiratory syncytial virus (RSV) 07/20/2020   Hypoxia 07/20/2020   Acute bronchitis 07/20/2020   Morbid obesity with BMI of 60.0-69.9, adult (HCC) 02/23/2019   MRSA carrier    Pressure injury of skin 02/22/2019   Hypertension 02/22/2019   Cellulitis of left buttock 02/21/2019     Past Surgical History   Past Surgical History:  Procedure Laterality Date   ADENOIDECTOMY     IRRIGATION AND DEBRIDEMENT ABSCESS Right 02/23/2019   Procedure: IRRIGATION AND DEBRIDEMENT PERI RECTAL ABSCESS;  Surgeon: Emelia Loron, MD;  Location: [facility] OR;  Service: General;  Laterality: Right;   TONSILLECTOMY       Home Medications   Prior to Admission medications   Medication Sig Start Date End Date Taking? Authorizing Provider  albuterol (VENTOLIN HFA) 108 (90 Base) MCG/ACT inhaler Inhale 2 puffs into the lungs every 6 (six) hours as needed for wheezing or shortness of breath.  07/21/20   Lynn Ito, MD  hydrochlorothiazide (HYDRODIURIL) 12.5 MG tablet Take 1 tablet (12.5 mg total) by mouth daily. 08/15/22   Wouk, Wilfred Curtis, MD  lisinopril (ZESTRIL) 20 MG tablet Take 1 tablet (20 mg total) by mouth daily. 08/15/22   Wouk, Wilfred Curtis, MD     Allergies  Patient has no known allergies.   Family History   Family History  Problem Relation Age of Onset   Gastric cancer Mother    Alcohol abuse Father    Obesity Father    Diabetes Mellitus II Brother      Physical Exam  Triage Vital Signs: ED Triage Vitals  Enc Vitals Group     BP      Pulse      Resp      Temp      Temp src      SpO2      Weight      Height      Head Circumference      Peak Flow      Pain Score      Pain Loc      Pain Edu?      Excl. in GC?     Updated Vital Signs: BP (!) 148/103   Pulse 85   Resp (!) 30   Ht 5\' 7"  (1.702 m)   Wt (!) 210.5 kg   SpO2 91%   BMI 72.68 kg/m  General: Awake, mild to moderate distress.  CV:  RRR.  Good peripheral perfusion.  Resp:  Increased effort.  Diminished aeration; bibasilar rales. Abd:  Morbidly obese.  Nontender.  No distention.  Other:  BLE lymphedema.   ED Results / Procedures / Treatments  Labs (all labs ordered are listed, but only abnormal results are displayed) Labs Reviewed  CBC WITH DIFFERENTIAL/PLATELET - Abnormal; Notable for the following components:      Result Value   WBC 14.2 (*)    RBC 5.83 (*)    Neutro Abs 8.5 (*)    Monocytes Absolute 1.2 (*)    Eosinophils Absolute 0.7 (*)    All other components within normal limits  COMPREHENSIVE METABOLIC PANEL - Abnormal; Notable for the following components:   Glucose, Bld 116 (*)    BUN 24 (*)    All other components within normal limits  RESP PANEL BY RT-PCR (FLU A&B, COVID) ARPGX2  BRAIN NATRIURETIC PEPTIDE  TROPONIN I (HIGH SENSITIVITY)     EKG  ED ECG REPORT I, Roxine Whittinghill J, the attending physician, personally viewed and interpreted this  ECG.   Date: 08/15/2022  EKG Time: 2313  Rate: 89  Rhythm: normal sinus rhythm  Axis: Normal  Intervals:left anterior fascicular block  ST&T Change: Nonspecific    RADIOLOGY I have independently visualized and interpreted patient's chest x-ray as well as noted the radiology interpretation:  X-ray: Pulmonary vascular congestion  Official radiology report(s): DG Chest Port 1 View  Result Date: 08/15/2022 CLINICAL DATA:  Shortness of breath. Respiratory distress. EXAM: PORTABLE CHEST 1 VIEW COMPARISON:  Radiograph 2 days ago 08/13/2022 FINDINGS: Slight improvement in cardiomegaly and vascular congestion from prior exam. No new airspace disease. No pneumothorax or large pleural effusion. Detailed assessment limited by soft tissue attenuation from habitus. IMPRESSION: Slight improvement in cardiomegaly and vascular congestion from prior exam. No new abnormality. Electronically Signed   By: Narda Rutherford M.D.   On: 08/15/2022 23:40     PROCEDURES:  Critical Care performed: Yes, see critical care procedure note(s)  CRITICAL CARE Performed by: Irean Hong   Total critical care time: 30 minutes  Critical care time was exclusive of separately billable procedures and treating other patients.  Critical care was necessary to treat or prevent imminent or life-threatening deterioration.  Critical care was time spent personally by me on the following activities: development of treatment plan with patient and/or surrogate as well as nursing, discussions with consultants, evaluation of patient's response to treatment, examination of patient, obtaining history from patient or surrogate, ordering and performing treatments and interventions, ordering and review of laboratory studies, ordering and review of radiographic studies, pulse oximetry and re-evaluation of patient's condition.   Marland Kitchen1-3 Lead EKG Interpretation  Performed by: Irean Hong, MD Authorized by: Irean Hong, MD      Interpretation: normal     ECG rate:  94   ECG rate assessment: normal     Rhythm: sinus rhythm     Ectopy: none     Conduction: normal   Comments:     Patient placed on cardiac monitor to evaluate for arrhythmias    MEDICATIONS ORDERED IN ED: Medications  methylPREDNISolone sodium succinate (SOLU-MEDROL) 125 mg/2 mL injection 125 mg (125 mg Intravenous Given 08/15/22 2333)  aspirin chewable tablet 324 mg (324 mg Oral Given 08/15/22 2333)  morphine (PF) 4 MG/ML injection 4 mg (4 mg Intravenous Given 08/15/22 2333)  nitroGLYCERIN (NITROGLYN) 2 % ointment 1 inch (1 inch Topical Given 08/15/22  2333)  furosemide (LASIX) injection 40 mg (40 mg Intravenous Given 08/16/22 0008)     IMPRESSION / MDM / ASSESSMENT AND PLAN / ED COURSE  I reviewed the triage vital signs and the nursing notes.                             36 year old male presenting in respiratory distress with hypoxemia. Differential includes, but is not limited to, viral syndrome, bronchitis including COPD exacerbation, pneumonia, reactive airway disease including asthma, CHF including exacerbation with or without pulmonary/interstitial edema, pneumothorax, ACS, thoracic trauma, and pulmonary embolism.  I have personally reviewed patient's recent hospitalization 11/7-11/05/2022 for acute CHF with hypoxia.  Patient's presentation is most consistent with acute presentation with potential threat to life or bodily function.  The patient is on the cardiac monitor to evaluate for evidence of arrhythmia and/or significant heart rate changes.  We will obtain cardiac panel, chest x-ray.  Respiratory panel was negative 11/7; do not need to repeat.  And 125 mg IV Solu-Medrol, 324 mg aspirin, 4 mg IV morphine, nitroglycerin paste for hypertension.  Anticipate hospitalization.  Clinical Course as of 08/16/22 0036  Caleen Essex Aug 16, 2022  3976 Patient did not use urinal so ambulated without staff knowledge to the commode.  He is extremely tachypneic  and diaphoretic.  Will place male PureWick.  Updated patient and family member of all test results and chest x-ray.  Will consult hospitalist services for evaluation and admission. [JS]    Clinical Course User Index [JS] Irean Hong, MD     FINAL CLINICAL IMPRESSION(S) / ED DIAGNOSES   Final diagnoses:  Hypertensive urgency  Respiratory distress  Acute respiratory failure with hypoxia (HCC)  Acute on chronic congestive heart failure, unspecified heart failure type (HCC)     Rx / DC Orders   ED Discharge Orders     None        Note:  This document was prepared using Dragon voice recognition software and may include unintentional dictation errors.   Irean Hong, MD 08/16/22 [phone]

## 2022-08-16 ENCOUNTER — Encounter: Payer: Self-pay | Admitting: Internal Medicine

## 2022-08-16 ENCOUNTER — Inpatient Hospital Stay: Payer: Self-pay

## 2022-08-16 ENCOUNTER — Other Ambulatory Visit: Payer: Self-pay

## 2022-08-16 DIAGNOSIS — J9601 Acute respiratory failure with hypoxia: Secondary | ICD-10-CM

## 2022-08-16 DIAGNOSIS — I161 Hypertensive emergency: Secondary | ICD-10-CM

## 2022-08-16 DIAGNOSIS — I160 Hypertensive urgency: Principal | ICD-10-CM | POA: Insufficient documentation

## 2022-08-16 DIAGNOSIS — I509 Heart failure, unspecified: Secondary | ICD-10-CM

## 2022-08-16 LAB — RESP PANEL BY RT-PCR (FLU A&B, COVID) ARPGX2
Influenza A by PCR: NEGATIVE
Influenza B by PCR: NEGATIVE
SARS Coronavirus 2 by RT PCR: NEGATIVE

## 2022-08-16 LAB — BLOOD GAS, ARTERIAL
Acid-Base Excess: 7.5 mmol/L — ABNORMAL HIGH (ref 0.0–2.0)
Bicarbonate: 33.2 mmol/L — ABNORMAL HIGH (ref 20.0–28.0)
FIO2: 0.44 %
O2 Saturation: 94.9 %
Patient temperature: 37
pCO2 arterial: 50 mmHg — ABNORMAL HIGH (ref 32–48)
pH, Arterial: 7.43 (ref 7.35–7.45)
pO2, Arterial: 66 mmHg — ABNORMAL LOW (ref 83–108)

## 2022-08-16 LAB — COMPREHENSIVE METABOLIC PANEL
ALT: 29 U/L (ref 0–44)
AST: 21 U/L (ref 15–41)
Albumin: 3.7 g/dL (ref 3.5–5.0)
Alkaline Phosphatase: 77 U/L (ref 38–126)
Anion gap: 9 (ref 5–15)
BUN: 24 mg/dL — ABNORMAL HIGH (ref 6–20)
CO2: 28 mmol/L (ref 22–32)
Calcium: 8.9 mg/dL (ref 8.9–10.3)
Chloride: 104 mmol/L (ref 98–111)
Creatinine, Ser: 0.73 mg/dL (ref 0.61–1.24)
GFR, Estimated: >60 mL/min (ref >60)
Glucose, Bld: 116 mg/dL — ABNORMAL HIGH (ref 70–99)
Potassium: 3.6 mmol/L (ref 3.5–5.1)
Sodium: 141 mmol/L (ref 135–145)
Total Bilirubin: 0.7 mg/dL (ref 0.3–1.2)
Total Protein: 7.8 g/dL (ref 6.5–8.1)

## 2022-08-16 LAB — RESPIRATORY PANEL BY PCR

## 2022-08-16 LAB — BRAIN NATRIURETIC PEPTIDE: B Natriuretic Peptide: 12 pg/mL (ref 0.0–100.0)

## 2022-08-16 LAB — TROPONIN I (HIGH SENSITIVITY)
Troponin I (High Sensitivity): 7 ng/L (ref <18)
Troponin I (High Sensitivity): 8 ng/L (ref <18)

## 2022-08-16 MED ORDER — ENOXAPARIN SODIUM 120 MG/0.8ML IJ SOSY
0.5000 mg/kg | PREFILLED_SYRINGE | INTRAMUSCULAR | Status: DC
  Administered 2022-08-16 – 2022-08-18 (×3): 105 mg via SUBCUTANEOUS
  Filled 2022-08-16 (×3): qty 0.7

## 2022-08-16 MED ORDER — NITROPRUSSIDE SODIUM 25 MG/ML IV SOLN
0.0000 ug/kg/min | INTRAVENOUS | Status: DC
  Administered 2022-08-16: 0.3 ug/kg/min via INTRAVENOUS
  Filled 2022-08-16: qty 2

## 2022-08-16 MED ORDER — AMLODIPINE BESYLATE 10 MG PO TABS
10.0000 mg | ORAL_TABLET | Freq: Every day | ORAL | Status: DC
  Administered 2022-08-16 – 2022-08-18 (×3): 10 mg via ORAL
  Filled 2022-08-16: qty 1
  Filled 2022-08-16: qty 2
  Filled 2022-08-16: qty 1

## 2022-08-16 MED ORDER — ONDANSETRON HCL 4 MG/2ML IJ SOLN: 4.0000 mg | Freq: Four times a day (QID) | INTRAMUSCULAR | Status: DC | PRN

## 2022-08-16 MED ORDER — METHYLPREDNISOLONE SODIUM SUCC 40 MG IJ SOLR: 40.0000 mg | Freq: Two times a day (BID) | INTRAMUSCULAR | Status: DC

## 2022-08-16 MED ORDER — PREDNISONE 20 MG PO TABS: 40.0000 mg | ORAL_TABLET | Freq: Every day | ORAL | Status: DC

## 2022-08-16 MED ORDER — NITROPRUSSIDE SODIUM-NACL 20-0.9 MG/100ML-% IV SOLN: 0.0000 ug/kg/min | INTRAVENOUS | Status: DC

## 2022-08-16 MED ORDER — LISINOPRIL 10 MG PO TABS: 20.0000 mg | ORAL_TABLET | Freq: Every day | ORAL | Status: DC

## 2022-08-16 MED ORDER — METHYLPREDNISOLONE SODIUM SUCC 40 MG IJ SOLR
40.0000 mg | Freq: Two times a day (BID) | INTRAMUSCULAR | Status: DC
  Administered 2022-08-16 – 2022-08-18 (×5): 40 mg via INTRAVENOUS
  Filled 2022-08-16 (×5): qty 1

## 2022-08-16 MED ORDER — NITROPRUSSIDE SODIUM-NACL 20-0.9 MG/100ML-% IV SOLN
0.0000 ug/kg/min | INTRAVENOUS | Status: DC
  Filled 2022-08-16: qty 100

## 2022-08-16 MED ORDER — IPRATROPIUM-ALBUTEROL 0.5-2.5 (3) MG/3ML IN SOLN
3.0000 mL | Freq: Four times a day (QID) | RESPIRATORY_TRACT | Status: DC
  Administered 2022-08-16 – 2022-08-17 (×8): 3 mL via RESPIRATORY_TRACT
  Filled 2022-08-16 (×8): qty 3

## 2022-08-16 MED ORDER — GUAIFENESIN ER 600 MG PO TB12
1200.0000 mg | ORAL_TABLET | Freq: Two times a day (BID) | ORAL | Status: DC
  Administered 2022-08-16 – 2022-08-18 (×6): 1200 mg via ORAL
  Filled 2022-08-16 (×6): qty 2

## 2022-08-16 MED ORDER — METHYLPREDNISOLONE SODIUM SUCC 125 MG IJ SOLR
125.0000 mg | Freq: Once | INTRAMUSCULAR | Status: DC
  Filled 2022-08-16: qty 2

## 2022-08-16 MED ORDER — HYDRALAZINE HCL 20 MG/ML IJ SOLN
10.0000 mg | Freq: Four times a day (QID) | INTRAMUSCULAR | Status: DC | PRN
  Administered 2022-08-16: 10 mg via INTRAVENOUS
  Filled 2022-08-16: qty 1

## 2022-08-16 MED ORDER — FUROSEMIDE 10 MG/ML IJ SOLN
40.0000 mg | Freq: Two times a day (BID) | INTRAMUSCULAR | Status: DC
  Administered 2022-08-16: 40 mg via INTRAVENOUS
  Filled 2022-08-16: qty 4

## 2022-08-16 MED ORDER — ONDANSETRON HCL 4 MG PO TABS: 4.0000 mg | ORAL_TABLET | Freq: Four times a day (QID) | ORAL | Status: DC | PRN

## 2022-08-16 MED ORDER — ALBUTEROL SULFATE (2.5 MG/3ML) 0.083% IN NEBU
2.5000 mg | INHALATION_SOLUTION | RESPIRATORY_TRACT | Status: DC | PRN
  Administered 2022-08-16: 2.5 mg via RESPIRATORY_TRACT
  Filled 2022-08-16: qty 3

## 2022-08-16 MED ORDER — FUROSEMIDE 10 MG/ML IJ SOLN
40.0000 mg | Freq: Once | INTRAMUSCULAR | Status: AC
  Administered 2022-08-16: 40 mg via INTRAVENOUS
  Filled 2022-08-16: qty 4

## 2022-08-16 MED ORDER — ACETAMINOPHEN 650 MG RE SUPP: 650.0000 mg | Freq: Four times a day (QID) | RECTAL | Status: DC | PRN

## 2022-08-16 MED ORDER — ACETAMINOPHEN 325 MG PO TABS: 650.0000 mg | ORAL_TABLET | Freq: Four times a day (QID) | ORAL | Status: DC | PRN

## 2022-08-16 MED ORDER — IOHEXOL 350 MG/ML SOLN
100.0000 mL | Freq: Once | INTRAVENOUS | Status: AC | PRN
  Administered 2022-08-16: 100 mL via INTRAVENOUS

## 2022-08-16 NOTE — Assessment & Plan Note (Addendum)
Suspect underlying COPD Scheduled and prn bronchodilators, IV steroids guaifenesin

## 2022-08-16 NOTE — Evaluation (Signed)
Occupational Therapy Evaluation Patient Details Name: Christopher Keith MRN: [id number] DOB: Nov 02, 1985 Today's Date: 08/16/2022   History of Present Illness Pt is a 36 year old male admitted with the ED with acute CHR, hypertensive emergency, acute respiratory failure with hypoxia, possible bronchitits; PMH significant for Morbid obesity, BMI over 60 and hypertension, hospitalized from 11/7 to 11/8 with acute CHF with respiratory failure, believed triggered by an acute viral infection, who improved and was weaned off of oxygen prior to discharge who returns to the ED a day later with shortness of breath, chest discomfort and wheezing   Clinical Impression   Chart reviewed, pt greeted in room, agreeable to OT evaluation. Co tx completed with PT on this date. PTA pt reports he is MOD I in ADL/IADL works in [employer] in [employer], amb household, short community distances without AD. Pt presents with deficits in endurance, activity tolerance affecting safe and optimal ADL completion. Bed mobility performed with supervision, amb 10' in room with CGA, LB dressing with SET UP, grooming tasks with SET UP. Pt requires vcs for energy conservation techniques, reports of SOB with minimal exertion.OT will continue to follow acutely.      Recommendations for follow up therapy are one component of a multi-disciplinary discharge planning process, led by the attending physician.  Recommendations may be updated based on patient status, additional functional criteria and insurance authorization.   Follow Up Recommendations  Home health OT    Assistance Recommended at Discharge Intermittent Supervision/Assistance  Patient can return home with the following A little help with walking and/or transfers;A little help with bathing/dressing/bathroom    Functional Status Assessment  Patient has had a recent decline in their functional status and demonstrates the ability to make significant improvements in function in a reasonable  and predictable amount of time.  Equipment Recommendations  BSC/3in1;Tub/shower bench (bari bsc, bari tub/shower bench), pending progress pt may benefit from mwc/pwc evaluation in the future    Recommendations for Other Services       Precautions / Restrictions Precautions Precautions: Fall Precaution Comments: watch HR and spo2 Restrictions Weight Bearing Restrictions: No      Mobility Bed Mobility Overal bed mobility: Needs Assistance Bed Mobility: Supine to Sit, Sit to Supine     Supine to sit: Supervision Sit to supine: Supervision        Transfers Overall transfer level: Needs assistance   Transfers: Sit to/from Stand Sit to Stand: Supervision, Min guard                  Balance Overall balance assessment: Needs assistance Sitting-balance support: Feet supported Sitting balance-Leahy Scale: Good     Standing balance support: During functional activity Standing balance-Leahy Scale: Good                             ADL either performed or assessed with clinical judgement   ADL Overall ADL's : Needs assistance/impaired Eating/Feeding: Set up;Sitting   Grooming: Set up               Lower Body Dressing: Set up;Sitting/lateral leans Lower Body Dressing Details (indicate cue type and reason): socks at edge of bed Toilet Transfer: Min guard;Ambulation Toilet Transfer Details (indicate cue type and reason): simulated, vcs for PLB Toileting- Clothing Manipulation and Hygiene: Minimal assistance Toileting - Clothing Manipulation Details (indicate cue type and reason): anticipated     Functional mobility during ADLs: Min guard;+2 for safety/equipment (approx 10' in  room, +2 for lines/leads)       Vision Patient Visual Report: No change from baseline       Perception     Praxis      Pertinent Vitals/Pain Pain Assessment Pain Assessment: No/denies pain     Hand Dominance     Extremity/Trunk Assessment Upper Extremity  Assessment Upper Extremity Assessment: Overall WFL for tasks assessed   Lower Extremity Assessment Lower Extremity Assessment: Defer to PT evaluation;Overall [facility] for tasks assessed   Cervical / Trunk Assessment Cervical / Trunk Assessment: Kyphotic (slightly kyphotic, due to body habitus)   Communication Communication Communication: No difficulties   Cognition Arousal/Alertness: Awake/alert Behavior During Therapy: WFL for tasks assessed/performed Overall Cognitive Status: Within Functional Limits for tasks assessed                                       General Comments  spo2 >90% on HFNC throughout, HR up to 125 with mobility, bending down to put on socks    Exercises Other Exercises Other Exercises: edu re: role of OT, role of rehab, discharge recommendations, home safety, positioning for improved breathing   Shoulder Instructions      Home Living Family/patient expects to be discharged to:: Private residence Living Arrangements: Other (Comment) (planning to dc to sisters house) Available Help at Discharge: Family;Friend(s);Available PRN/intermittently Type of Home: Mobile home (sister house) Home Access: Stairs to enter Entrance Stairs-Number of Steps: 3 total, 2 to porch, 1 into house   Home Layout: One level     Bathroom Shower/Tub: Tub/shower unit         Home Equipment: None   Additional Comments: home set up provided for sisters house where pt reports he plans to discharge      Prior Functioning/Environment Prior Level of Function : Independent/Modified Independent             Mobility Comments: limited community distances, no AD ADLs Comments: works in [employer], indep in ADL/IADL        OT Problem List: Decreased strength;Decreased activity tolerance      OT Treatment/Interventions: Self-care/ADL training;Patient/family education;Therapeutic exercise;Energy conservation;Therapeutic activities;DME and/or AE instruction    OT  Goals(Current goals can be found in the care plan section) Acute Rehab OT Goals Patient Stated Goal: get stronger OT Goal Formulation: With patient/family Time For Goal Achievement: 08/30/22 Potential to Achieve Goals: Good ADL Goals Pt Will Perform Grooming: sitting;standing;with modified independence Pt Will Perform Lower Body Dressing: with modified independence;sitting/lateral leans;sit to/from stand Pt Will Transfer to Toilet: with modified independence Pt Will Perform Toileting - Clothing Manipulation and hygiene: with modified independence  OT Frequency: Min 2X/week    Co-evaluation PT/OT/SLP Co-Evaluation/Treatment: Yes Reason for Co-Treatment: Complexity of the patient's impairments (multi-system involvement);For patient/therapist safety   OT goals addressed during session: ADL's and self-care      AM-PAC OT "6 Clicks" Daily Activity     Outcome Measure Help from another person eating meals?: None Help from another person taking care of personal grooming?: None Help from another person toileting, which includes using toliet, bedpan, or urinal?: A Little Help from another person bathing (including washing, rinsing, drying)?: A Lot Help from another person to put on and taking off regular upper body clothing?: None Help from another person to put on and taking off regular lower body clothing?: None 6 Click Score: 21   End of Session Nurse Communication: Mobility status  Activity Tolerance: Patient tolerated treatment well Patient left: in bed;with call bell/phone within reach;with family/visitor present  OT Visit Diagnosis: Unsteadiness on feet (R26.81)                Time: 2119-4174 OT Time Calculation (min): 20 min Charges:  OT General Charges $OT Visit: 1 Visit OT Evaluation $OT Eval Moderate Complexity: 1 Mod  Oleta Mouse, OTD OTR/L  08/16/22, 1:57 PM

## 2022-08-16 NOTE — H&P (Signed)
History and Physical    Patient: Christopher Keith [id number] DOB: Nov 09, 1985 DOA: 08/15/2022 DOS: the patient was seen and examined on 08/16/2022 PCP: Patient, No Pcp Per  Patient coming from: Home  Chief Complaint:  Chief Complaint  Patient presents with   Respiratory Distress    HPI: Christopher Keith is a 36 y.o. male with medical history significant for Morbid obesity, BMI over 60 and hypertension, hospitalized from 11/7 to 11/8 with acute CHF with respiratory failure, believed triggered by an acute viral infection, who improved and was weaned off of oxygen prior to discharge who returns to the ED a day later with shortness of breath, chest discomfort and wheezing.  He has a congested cough but is unable to bring up anything.denies fever or chills.  Recent COVID and flu were negative.  Echocardiogram was mostly unremarkable with EF 65 to 70% but unable to estimate diastolic parameters. ED course and data review: BP 181/112 on arrival with pulse 94, respirations 22.  O2 sat 96% on NRB.  He was weaned off NRB to 6 L and then off but desatted to 88% and was placed back on O2 at 2 L.  COVID and flu were negative.  Troponin 7, BNP 12.  WBC 14,000.EKG, personally viewed and interpreted showed sinus at 89 with RBBB and LAFB.  Chest x-ray showed slight improvement in cardiomegaly and vascular congestion from prior exam without new abnormality. Patient was treated with IV Lasix, chewable aspirin, morphine and NTG ointment applied.  Also received a DuoNeb treatment.  Hospitalist consulted for admission.   Review of Systems: As mentioned in the history of present illness. All other systems reviewed and are negative.  Past Medical History:  Diagnosis Date   Hypertension    MRSA carrier    Obesity    Past Surgical History:  Procedure Laterality Date   ADENOIDECTOMY     IRRIGATION AND DEBRIDEMENT ABSCESS Right 02/23/2019   Procedure: IRRIGATION AND DEBRIDEMENT PERI RECTAL ABSCESS;  Surgeon: Rolm Bookbinder, MD;  Location: [facility];  Service: General;  Laterality: Right;   TONSILLECTOMY     Social History:  reports that he has been smoking cigarettes. He has never used smokeless tobacco. He reports current alcohol use. He reports that he does not use drugs.  No Known Allergies  Family History  Problem Relation Age of Onset   Gastric cancer Mother    Alcohol abuse Father    Obesity Father    Diabetes Mellitus II Brother     Prior to Admission medications   Medication Sig Start Date End Date Taking? Authorizing Provider  albuterol (VENTOLIN HFA) 108 (90 Base) MCG/ACT inhaler Inhale 2 puffs into the lungs every 6 (six) hours as needed for wheezing or shortness of breath. 07/21/20  Yes Nolberto Hanlon, MD  hydrochlorothiazide (HYDRODIURIL) 12.5 MG tablet Take 1 tablet (12.5 mg total) by mouth daily. Patient not taking: Reported on 08/16/2022 08/15/22   Wouk, Ailene Rud, MD  lisinopril (ZESTRIL) 20 MG tablet Take 1 tablet (20 mg total) by mouth daily. Patient not taking: Reported on 08/16/2022 08/15/22   Gwynne Edinger, MD    Physical Exam: Vitals:   08/15/22 2313 08/15/22 2314 08/15/22 2330 08/16/22 0000  BP: (!) 181/112  (!) 185/96 (!) 148/103  Pulse: 94  91 85  Resp: (!) 22  (!) 23 (!) 30  SpO2: 96%  95% 91%  Weight:  (!) 210.5 kg    Height:  5\' 7"  (1.702 m)     Physical Exam  Vitals and nursing note reviewed.  Constitutional:      General: He is not in acute distress.    Appearance: He is morbidly obese.     Interventions: Nasal cannula in place.  HENT:     Head: Normocephalic and atraumatic.  Cardiovascular:     Rate and Rhythm: Regular rhythm. Tachycardia present.     Heart sounds: Normal heart sounds.  Pulmonary:     Effort: Pulmonary effort is normal. Tachypnea present.     Breath sounds: Wheezing and rhonchi present.  Abdominal:     Palpations: Abdomen is soft.     Tenderness: There is no abdominal tenderness.  Neurological:     Mental Status: Mental status  is at baseline.     Labs on Admission: I have personally reviewed following labs and imaging studies  CBC: Recent Labs  Lab 08/13/22 2202 08/14/22 0317 08/15/22 2339  WBC 11.0* 10.4 14.2*  NEUTROABS  --   --  8.5*  HGB 14.9 15.6 16.0  HCT 45.9 48.2 50.4  MCV 85.5 85.0 86.4  PLT 326 351 0000000   Basic Metabolic Panel: Recent Labs  Lab 08/13/22 2202 08/14/22 0317 08/15/22 2339  NA 140 136 141  K 3.5 4.0 3.6  CL 104 102 104  CO2 27 26 28   GLUCOSE 150* 191* 116*  BUN 15 13 24*  CREATININE 0.63 0.63 0.73  CALCIUM 8.8* 9.0 8.9   GFR: Estimated Creatinine Clearance: 219.4 mL/min (by C-G formula based on SCr of 0.73 mg/dL). Liver Function Tests: Recent Labs  Lab 08/13/22 2202 08/15/22 2339  AST 24 21  ALT 27 29  ALKPHOS 74 77  BILITOT 0.7 0.7  PROT 7.4 7.8  ALBUMIN 3.4* 3.7   No results for input(s): "LIPASE", "AMYLASE" in the last 168 hours. No results for input(s): "AMMONIA" in the last 168 hours. Coagulation Profile: No results for input(s): "INR", "PROTIME" in the last 168 hours. Cardiac Enzymes: No results for input(s): "CKTOTAL", "CKMB", "CKMBINDEX", "TROPONINI" in the last 168 hours. BNP (last 3 results) No results for input(s): "PROBNP" in the last 8760 hours. HbA1C: Recent Labs    08/14/22 1107  HGBA1C 5.5   CBG: No results for input(s): "GLUCAP" in the last 168 hours. Lipid Profile: No results for input(s): "CHOL", "HDL", "LDLCALC", "TRIG", "CHOLHDL", "LDLDIRECT" in the last 72 hours. Thyroid Function Tests: No results for input(s): "TSH", "T4TOTAL", "FREET4", "T3FREE", "THYROIDAB" in the last 72 hours. Anemia Panel: No results for input(s): "VITAMINB12", "FOLATE", "FERRITIN", "TIBC", "IRON", "RETICCTPCT" in the last 72 hours. Urine analysis: No results found for: "COLORURINE", "APPEARANCEUR", "LABSPEC", "PHURINE", "GLUCOSEU", "HGBUR", "BILIRUBINUR", "KETONESUR", "PROTEINUR", "UROBILINOGEN", "NITRITE", "LEUKOCYTESUR"  Radiological Exams on  Admission: DG Chest Port 1 View  Result Date: 08/15/2022 CLINICAL DATA:  Shortness of breath. Respiratory distress. EXAM: PORTABLE CHEST 1 VIEW COMPARISON:  Radiograph 2 days ago 08/13/2022 FINDINGS: Slight improvement in cardiomegaly and vascular congestion from prior exam. No new airspace disease. No pneumothorax or large pleural effusion. Detailed assessment limited by soft tissue attenuation from habitus. IMPRESSION: Slight improvement in cardiomegaly and vascular congestion from prior exam. No new abnormality. Electronically Signed   By: Keith Rake M.D.   On: 08/15/2022 23:40   ECHOCARDIOGRAM COMPLETE  Result Date: 08/14/2022    ECHOCARDIOGRAM REPORT   Patient Name:   DEWEY CORDREY  Date of Exam: 08/14/2022 Medical Rec #:  [id number]  Height:       67.0 in Accession #:    [id number] Weight:       467.0  lb Date of Birth:  Feb 24, 1986  BSA:          2.900 m Patient Age:    61 years   BP:           180/84 mmHg Patient Gender: M          HR:           66 bpm. Exam Location:  [facility] Procedure: 2D Echo, Cardiac Doppler and Color Doppler Indications:     CHF-acute diastolic [id number]  History:         Patient has no prior history of Echocardiogram examinations.                  Risk Factors:Hypertension.  Sonographer:     Sherrie Sport Referring Phys:  [id number] JAN A MANSY Diagnosing Phys: Kate Sable MD  Sonographer Comments: Technically challenging study due to limited acoustic windows, no apical window and no subcostal window. IMPRESSIONS  1. Left ventricular ejection fraction, by estimation, is 65 to 70%. The left ventricle has normal function. The left ventricle has no regional wall motion abnormalities. There is mild left ventricular hypertrophy. Left ventricular diastolic function could not be evaluated.  2. Right ventricular systolic function is normal. The right ventricular size is not well visualized.  3. The mitral valve is normal in structure. No evidence of mitral valve regurgitation.  4. The aortic  valve was not well visualized. Aortic valve regurgitation is not visualized.  5. The inferior vena cava is normal in size with greater than 50% respiratory variability, suggesting right atrial pressure of 3 mmHg. FINDINGS  Left Ventricle: Left ventricular ejection fraction, by estimation, is 65 to 70%. The left ventricle has normal function. The left ventricle has no regional wall motion abnormalities. The left ventricular internal cavity size was normal in size. There is  mild left ventricular hypertrophy. Left ventricular diastolic function could not be evaluated. Right Ventricle: The right ventricular size is not well visualized. No increase in right ventricular wall thickness. Right ventricular systolic function is normal. Left Atrium: Left atrial size was normal in size. Right Atrium: Right atrial size was not well visualized. Pericardium: There is no evidence of pericardial effusion. Mitral Valve: The mitral valve is normal in structure. No evidence of mitral valve regurgitation. Tricuspid Valve: The tricuspid valve is not well visualized. Tricuspid valve regurgitation is not demonstrated. Aortic Valve: The aortic valve was not well visualized. Aortic valve regurgitation is not visualized. Pulmonic Valve: The pulmonic valve was not well visualized. Pulmonic valve regurgitation is not visualized. Aorta: The aortic root is normal in size and structure. Venous: The inferior vena cava is normal in size with greater than 50% respiratory variability, suggesting right atrial pressure of 3 mmHg. IAS/Shunts: No atrial level shunt detected by color flow Doppler.  LEFT VENTRICLE PLAX 2D LVIDd:         5.30 cm LVIDs:         3.20 cm LV PW:         1.40 cm LV IVS:        1.30 cm LVOT diam:     2.00 cm LVOT Area:     3.14 cm  LEFT ATRIUM         Index LA diam:    4.00 cm 1.38 cm/m   AORTA Ao Root diam: 2.90 cm  SHUNTS Systemic Diam: 2.00 cm Kate Sable MD Electronically signed by Kate Sable MD Signature  Date/Time: 08/14/2022/3:13:35 PM    Final  Data Reviewed: Relevant notes from primary care and specialist visits, past discharge summaries as available in EHR, including Care Everywhere. Prior diagnostic testing as pertinent to current admission diagnoses Updated medications and problem lists for reconciliation ED course, including vitals, labs, imaging, treatment and response to treatment Triage notes, nursing and pharmacy notes and ED provider's notes Notable results as noted in HPI   Assessment and Plan: * Acute CHF (congestive heart failure) (HCC) Hypertensive emergency Acute respiratory failure with hypoxia Possible bronchitis Patient presented with acute respiratory distress initially on NRB now requiring 2 L to maintain sats in the mid 90s Trigger for acute CHF possibly related to hypertensive urgency.  Viral infection uncertain. Could have underlying COPD.  Patient is a prior smoker Recent echocardiogram with EF 70% but indeterminate diastolic parameters Troponin and BNP WNL and chest x-ray showing improvement and prior vascular congestion Continue IV Lasix, nitroglycerin ointment Daily weights with intake and output monitoring BP control with home lisinopril and HCTZ with IV hydralazine as needed DuoNebs as needed Will benefit from outpatient PFTs as well as sleep study  Acute bronchitis Suspect underlying COPD Scheduled and prn bronchodilators, IV steroids guaifenesin   Morbid obesity with BMI of 60.0-69.9, adult (HCC) Complicating factor to overall prognosis and care        DVT prophylaxis: Lovenox  Consults: none  Advance Care Planning:   Code Status: Prior   Family Communication: none  Disposition Plan: Back to previous home environment  Severity of Illness: The appropriate patient status for this patient is INPATIENT. Inpatient status is judged to be reasonable and necessary in order to provide the required intensity of service to ensure the  patient's safety. The patient's presenting symptoms, physical exam findings, and initial radiographic and laboratory data in the context of their chronic comorbidities is felt to place them at high risk for further clinical deterioration. Furthermore, it is not anticipated that the patient will be medically stable for discharge from the hospital within 2 midnights of admission.   * I certify that at the point of admission it is my clinical judgment that the patient will require inpatient hospital care spanning beyond 2 midnights from the point of admission due to high intensity of service, high risk for further deterioration and high frequency of surveillance required.*  Author: Andris Baumann, MD 08/16/2022 1:04 AM  For on call review [email or web address].

## 2022-08-16 NOTE — Progress Notes (Signed)
Anticoagulation monitoring(Lovenox):  36 yo  male ordered Lovenox 40 mg Q24h    Filed Weights   08/15/22 2314  Weight: (!) 210.5 kg (464 lb 1.1 oz)   BMI 72.7    Lab Results  Component Value Date   CREATININE 0.73 08/15/2022   CREATININE 0.63 08/14/2022   CREATININE 0.63 08/13/2022   Estimated Creatinine Clearance: 219.4 mL/min (by C-G formula based on SCr of 0.73 mg/dL). Hemoglobin & Hematocrit     Component Value Date/Time   HGB 16.0 08/15/2022 2339   HGB 14.3 10/21/2013 0711   HCT 50.4 08/15/2022 2339   HCT 43.6 10/21/2013 0711     Per Protocol for Patient with estCrcl > 30 ml/min and BMI > 30, will transition to Lovenox 105 mg Q24h.

## 2022-08-16 NOTE — ED Notes (Signed)
Patient provided with ice pack and fan per request.

## 2022-08-16 NOTE — ED Notes (Signed)
Pt is AOX4, sitting up in bed. Hi-flow [place] remains. Pt states the hi-flow has helped his breathing a lot. Respirations even and unlabored, cough noted. Son at bedside at this time.

## 2022-08-16 NOTE — Progress Notes (Signed)
PROGRESS NOTE    Christopher Keith  QPY:[id number] DOB: [birth year] DOA: 08/15/2022 PCP: Patient, No Pcp Per      Brief Narrative:   From admission h and p Christopher Keith is a 36 y.o. male with medical history significant for Morbid obesity, BMI over 60 and hypertension, hospitalized from 11/7 to 11/8 with acute CHF with respiratory failure, believed triggered by an acute viral infection, who improved and was weaned off of oxygen prior to discharge who returns to the ED a day later with shortness of breath, chest discomfort and wheezing.  He has a congested cough but is unable to bring up anything.denies fever or chills.  Recent COVID and flu were negative.  Echocardiogram was mostly unremarkable with EF 65 to 70% but unable to estimate diastolic parameters. ED course and data review: BP 181/112 on arrival with pulse 94, respirations 22.  O2 sat 96% on NRB.  He was weaned off NRB to 6 L and then off but desatted to 88% and was placed back on O2 at 2 L.  COVID and flu were negative.  Troponin 7, BNP 12.  WBC 14,000.EKG, personally viewed and interpreted showed sinus at 89 with RBBB and LAFB.  Chest x-ray showed slight improvement in cardiomegaly and vascular congestion from prior exam without new abnormality. Patient was treated with IV Lasix, chewable aspirin, morphine and NTG ointment applied.  Also received a DuoNeb treatment.  Hospitalist consulted for admission.    Assessment & Plan:   Principal Problem:   Acute CHF (congestive heart failure) (HCC) Active Problems:   Acute bronchitis   Acute respiratory failure with hypoxia (HCC)   Hypertension   Morbid obesity with BMI of 60.0-69.9, adult (HCC)   Hypertensive urgency   # Acute hypoxic hypercarbic respiratory failure Here tachypnic and hypoxic. Likely multifactorial, as discussed below. Says he doesn't tolerate bipap - will discuss w/ RT, likely try high-flow  # Dyspnea Given wheeze and response to bronchodilators think there is likely a  component of reactive airway disease and so will continue steroids and bronchodilators. Am not convinced this is chf given normal BNPs and normal TTE 2 days ago though some vascular congestion is seen on CXR - will continue diuretics as kidney function allows. No chest pain and normal troponins, doubt acs. No focal infiltrate or fever to suggest pneumonia, covid neg. Procal wnl on 11/8. Some degree of hypercarbia likely contributes though baseline co2 is not significantly elevated - methylpred - duonebs - CTA - O2 as above - f/u respiratory panel  # Hypertensive urgency # Chronic hypertension Systolics in the 190s - will start nitroprusside gtt, given concern for chf should also help w/ afterload reduction - home hctz and lisinopril on hold while diuresing, getting IV contrast for CTA  # Morbid obesity Complicates care  DVT prophylaxis: lovenox Code Status: full Family Communication: none @ bedside  Level of care: Progressive Status is: Inpatient Remains inpatient appropriate because: severity of illness    Consultants:  none  Procedures: none  Antimicrobials:  none    Subjective: Reports breathing somewhat improved, still dyspneic  Objective: Vitals:   08/16/22 0540 08/16/22 0600 08/16/22 0700 08/16/22 0719  BP: (!) 179/101 (!) 168/96 (!) 190/111 (!) 190/111  Pulse:  71 75 79  Resp:   17 (!) 22  Temp:    (!) 97.3 F (36.3 C)  TempSrc:    Axillary  SpO2:  92% 93% 91%  Weight:      Height:  Intake/Output Summary (Last 24 hours) at 08/16/2022 0826 Last data filed at 08/16/2022 1610 Gross per 24 hour  Intake --  Output 900 ml  Net -900 ml   Filed Weights   08/15/22 2314  Weight: (!) 210.5 kg    Examination:  General exam: diaphoretic, in mild distress Respiratory system: exp wheeze throughout Cardiovascular system: S1 & S2 heard, tachycardic.   Gastrointestinal system: Abdomen is obese, soft and nontender. No organomegaly or masses felt.    Central nervous system: Alert and oriented. No focal neurological deficits. Extremities: Symmetric 5 x 5 power. 1+ LE edema Skin: No rashes, lesions or ulcers Psychiatry: Judgement and insight appear normal. Mood & affect appropriate.     Data Reviewed: I have personally reviewed following labs and imaging studies  CBC: Recent Labs  Lab 08/13/22 2202 08/14/22 0317 08/15/22 2339  WBC 11.0* 10.4 14.2*  NEUTROABS  --   --  8.5*  HGB 14.9 15.6 16.0  HCT 45.9 48.2 50.4  MCV 85.5 85.0 86.4  PLT 326 351 384   Basic Metabolic Panel: Recent Labs  Lab 08/13/22 2202 08/14/22 0317 08/15/22 2339  NA 140 136 141  K 3.5 4.0 3.6  CL 104 102 104  CO2 27 26 28   GLUCOSE 150* 191* 116*  BUN 15 13 24*  CREATININE 0.63 0.63 0.73  CALCIUM 8.8* 9.0 8.9   GFR: Estimated Creatinine Clearance: 219.4 mL/min (by C-G formula based on SCr of 0.73 mg/dL). Liver Function Tests: Recent Labs  Lab 08/13/22 2202 08/15/22 2339  AST 24 21  ALT 27 29  ALKPHOS 74 77  BILITOT 0.7 0.7  PROT 7.4 7.8  ALBUMIN 3.4* 3.7   No results for input(s): "LIPASE", "AMYLASE" in the last 168 hours. No results for input(s): "AMMONIA" in the last 168 hours. Coagulation Profile: No results for input(s): "INR", "PROTIME" in the last 168 hours. Cardiac Enzymes: No results for input(s): "CKTOTAL", "CKMB", "CKMBINDEX", "TROPONINI" in the last 168 hours. BNP (last 3 results) No results for input(s): "PROBNP" in the last 8760 hours. HbA1C: Recent Labs    08/14/22 1107  HGBA1C 5.5   CBG: No results for input(s): "GLUCAP" in the last 168 hours. Lipid Profile: No results for input(s): "CHOL", "HDL", "LDLCALC", "TRIG", "CHOLHDL", "LDLDIRECT" in the last 72 hours. Thyroid Function Tests: No results for input(s): "TSH", "T4TOTAL", "FREET4", "T3FREE", "THYROIDAB" in the last 72 hours. Anemia Panel: No results for input(s): "VITAMINB12", "FOLATE", "FERRITIN", "TIBC", "IRON", "RETICCTPCT" in the last 72 hours. Urine  analysis: No results found for: "COLORURINE", "APPEARANCEUR", "LABSPEC", "PHURINE", "GLUCOSEU", "HGBUR", "BILIRUBINUR", "KETONESUR", "PROTEINUR", "UROBILINOGEN", "NITRITE", "LEUKOCYTESUR" Sepsis Labs: @LABRCNTIP (procalcitonin:4,lacticidven:4)  ) Recent Results (from the past 240 hour(s))  Resp Panel by RT-PCR (Flu A&B, Covid) Anterior Nasal Swab     Status: None   Collection Time: 08/13/22 10:02 PM   Specimen: Anterior Nasal Swab  Result Value Ref Range Status   SARS Coronavirus 2 by RT PCR NEGATIVE NEGATIVE Final    Comment: (NOTE) SARS-CoV-2 target nucleic acids are NOT DETECTED.  The SARS-CoV-2 RNA is generally detectable in upper respiratory specimens during the acute phase of infection. The lowest concentration of SARS-CoV-2 viral copies this assay can detect is 138 copies/mL. A negative result does not preclude SARS-Cov-2 infection and should not be used as the sole basis for treatment or other patient management decisions. A negative result may occur with  improper specimen collection/handling, submission of specimen other than nasopharyngeal swab, presence of viral mutation(s) within the areas targeted by this assay, and  inadequate number of viral copies(<138 copies/mL). A negative result must be combined with clinical observations, patient history, and epidemiological information. The expected result is Negative.  Fact Sheet for Patients:  BloggerCourse.comhttps://www.fda.gov/media/152166/download  Fact Sheet for Healthcare Providers:  SeriousBroker.ithttps://www.fda.gov/media/152162/download  This test is no t yet approved or cleared by the [place] FDA and  has been authorized for detection and/or diagnosis of SARS-CoV-2 by FDA under an Emergency Use Authorization (EUA). This EUA will remain  in effect (meaning this test can be used) for the duration of the COVID-19 declaration under Section 564(b)(1) of the Act, 21 U.S.C.section 360bbb-3(b)(1), unless the authorization is terminated  or  revoked sooner.       Influenza A by PCR NEGATIVE NEGATIVE Final   Influenza B by PCR NEGATIVE NEGATIVE Final    Comment: (NOTE) The Xpert Xpress SARS-CoV-2/FLU/RSV plus assay is intended as an aid in the diagnosis of influenza from Nasopharyngeal swab specimens and should not be used as a sole basis for treatment. Nasal washings and aspirates are unacceptable for Xpert Xpress SARS-CoV-2/FLU/RSV testing.  Fact Sheet for Patients: BloggerCourse.comhttps://www.fda.gov/media/152166/download  Fact Sheet for Healthcare Providers: SeriousBroker.ithttps://www.fda.gov/media/152162/download  This test is not yet approved or cleared by the [place] FDA and has been authorized for detection and/or diagnosis of SARS-CoV-2 by FDA under an Emergency Use Authorization (EUA). This EUA will remain in effect (meaning this test can be used) for the duration of the COVID-19 declaration under Section 564(b)(1) of the Act, 21 U.S.C. section 360bbb-3(b)(1), unless the authorization is terminated or revoked.  Performed at [facility], [address] [place], [place] [address]   Resp Panel by RT-PCR (Flu A&B, Covid) Anterior Nasal Swab     Status: None   Collection Time: 08/15/22 11:39 PM   Specimen: Anterior Nasal Swab  Result Value Ref Range Status   SARS Coronavirus 2 by RT PCR NEGATIVE NEGATIVE Final    Comment: (NOTE) SARS-CoV-2 target nucleic acids are NOT DETECTED.  The SARS-CoV-2 RNA is generally detectable in upper respiratory specimens during the acute phase of infection. The lowest concentration of SARS-CoV-2 viral copies this assay can detect is 138 copies/mL. A negative result does not preclude SARS-Cov-2 infection and should not be used as the sole basis for treatment or other patient management decisions. A negative result may occur with  improper specimen collection/handling, submission of specimen other than nasopharyngeal swab, presence of viral mutation(s) within the areas targeted by this  assay, and inadequate number of viral copies(<138 copies/mL). A negative result must be combined with clinical observations, patient history, and epidemiological information. The expected result is Negative.  Fact Sheet for Patients:  BloggerCourse.comhttps://www.fda.gov/media/152166/download  Fact Sheet for Healthcare Providers:  SeriousBroker.ithttps://www.fda.gov/media/152162/download  This test is no t yet approved or cleared by the [place] FDA and  has been authorized for detection and/or diagnosis of SARS-CoV-2 by FDA under an Emergency Use Authorization (EUA). This EUA will remain  in effect (meaning this test can be used) for the duration of the COVID-19 declaration under Section 564(b)(1) of the Act, 21 U.S.C.section 360bbb-3(b)(1), unless the authorization is terminated  or revoked sooner.       Influenza A by PCR NEGATIVE NEGATIVE Final   Influenza B by PCR NEGATIVE NEGATIVE Final    Comment: (NOTE) The Xpert Xpress SARS-CoV-2/FLU/RSV plus assay is intended as an aid in the diagnosis of influenza from Nasopharyngeal swab specimens and should not be used as a sole basis for treatment. Nasal washings and aspirates are unacceptable for Xpert Xpress  SARS-CoV-2/FLU/RSV testing.  Fact Sheet for Patients: BloggerCourse.com  Fact Sheet for Healthcare Providers: SeriousBroker.it  This test is not yet approved or cleared by the [place] FDA and has been authorized for detection and/or diagnosis of SARS-CoV-2 by FDA under an Emergency Use Authorization (EUA). This EUA will remain in effect (meaning this test can be used) for the duration of the COVID-19 declaration under Section 564(b)(1) of the Act, 21 U.S.C. section 360bbb-3(b)(1), unless the authorization is terminated or revoked.  Performed at [facility], [address], [place], [place] [address]          Radiology Studies: [facility] Chest [place] 1 View  Result Date:  08/15/2022 CLINICAL DATA:  Shortness of breath. Respiratory distress. EXAM: PORTABLE CHEST 1 VIEW COMPARISON:  Radiograph 2 days ago 08/13/2022 FINDINGS: Slight improvement in cardiomegaly and vascular congestion from prior exam. No new airspace disease. No pneumothorax or large pleural effusion. Detailed assessment limited by soft tissue attenuation from habitus. IMPRESSION: Slight improvement in cardiomegaly and vascular congestion from prior exam. No new abnormality. Electronically Signed   By: Narda Rutherford M.D.   On: 08/15/2022 23:40   ECHOCARDIOGRAM COMPLETE  Result Date: 08/14/2022    ECHOCARDIOGRAM REPORT   Patient Name:   Christopher Keith  Date of Exam: 08/14/2022 Medical Rec #:  [id number]  Height:       67.0 in Accession #:    [id number] Weight:       467.0 lb Date of Birth:  Nov 17, 1985  BSA:          2.900 m Patient Age:    36 years   BP:           180/84 mmHg Patient Gender: M          HR:           66 bpm. Exam Location:  [facility] Procedure: 2D Echo, Cardiac Doppler and Color Doppler Indications:     CHF-acute diastolic I50.31  History:         Patient has no prior history of Echocardiogram examinations.                  Risk Factors:Hypertension.  Sonographer:     Cristela Blue Referring Phys:  [id number] JAN A MANSY Diagnosing Phys: Debbe Odea MD  Sonographer Comments: Technically challenging study due to limited acoustic windows, no apical window and no subcostal window. IMPRESSIONS  1. Left ventricular ejection fraction, by estimation, is 65 to 70%. The left ventricle has normal function. The left ventricle has no regional wall motion abnormalities. There is mild left ventricular hypertrophy. Left ventricular diastolic function could not be evaluated.  2. Right ventricular systolic function is normal. The right ventricular size is not well visualized.  3. The mitral valve is normal in structure. No evidence of mitral valve regurgitation.  4. The aortic valve was not well visualized. Aortic valve  regurgitation is not visualized.  5. The inferior vena cava is normal in size with greater than 50% respiratory variability, suggesting right atrial pressure of 3 mmHg. FINDINGS  Left Ventricle: Left ventricular ejection fraction, by estimation, is 65 to 70%. The left ventricle has normal function. The left ventricle has no regional wall motion abnormalities. The left ventricular internal cavity size was normal in size. There is  mild left ventricular hypertrophy. Left ventricular diastolic function could not be evaluated. Right Ventricle: The right ventricular size is not well visualized. No increase in right ventricular wall thickness. Right ventricular systolic function is normal.  Left Atrium: Left atrial size was normal in size. Right Atrium: Right atrial size was not well visualized. Pericardium: There is no evidence of pericardial effusion. Mitral Valve: The mitral valve is normal in structure. No evidence of mitral valve regurgitation. Tricuspid Valve: The tricuspid valve is not well visualized. Tricuspid valve regurgitation is not demonstrated. Aortic Valve: The aortic valve was not well visualized. Aortic valve regurgitation is not visualized. Pulmonic Valve: The pulmonic valve was not well visualized. Pulmonic valve regurgitation is not visualized. Aorta: The aortic root is normal in size and structure. Venous: The inferior vena cava is normal in size with greater than 50% respiratory variability, suggesting right atrial pressure of 3 mmHg. IAS/Shunts: No atrial level shunt detected by color flow Doppler.  LEFT VENTRICLE PLAX 2D LVIDd:         5.30 cm LVIDs:         3.20 cm LV PW:         1.40 cm LV IVS:        1.30 cm LVOT diam:     2.00 cm LVOT Area:     3.14 cm  LEFT ATRIUM         Index LA diam:    4.00 cm 1.38 cm/m   AORTA Ao Root diam: 2.90 cm  SHUNTS Systemic Diam: 2.00 cm Debbe Odea MD Electronically signed by Debbe Odea MD Signature Date/Time: 08/14/2022/3:13:35 PM    Final          Scheduled Meds:  enoxaparin (LOVENOX) injection  0.5 mg/kg Subcutaneous Q24H   guaiFENesin  1,200 mg Oral BID   ipratropium-albuterol  3 mL Nebulization Q6H   methylPREDNISolone (SOLU-MEDROL) injection  40 mg Intravenous Q12H   Continuous Infusions:  nitroPRUSSide       LOS: 0 days     Silvano Bilis, MD [facility]   If 7PM-7AM, please contact night-coverage [email or web address] Password [facility] 08/16/2022, 8:26 AM

## 2022-08-16 NOTE — ED Notes (Addendum)
MD Wouk notified that nitro drip is maxed out and notified of current SBP of 111 and MAP of 93. MD requesting weaning of nitro to BP goal less than or equal to 160/110.

## 2022-08-16 NOTE — Consult Note (Addendum)
   Heart Failure Nurse Navigator Note  HFpEF 65 to 70%.  Mild LVH.  Unable to eluate diastolic dysfunction.  He presented to the emergency room with complaints of worsening shortness of breath.  Blood pressure was 181/112.  Chest x-ray revealed vascular congestion.  BNP 12( low reading due to obesity.)    Comorbidities:  Continued tobacco abuse Hypertension Morbid obesity  Medications:  Hydrochlorothiazide 12.5 mg daily Lisinopril 20 mg daily   Labs:  Sodium 141, potassium 3.6, chloride 104, 6 CO2 28, BUN 24, creatinine 0.73 Weight documented at 210.5 kg BMI 72.68 kg/m2  Met with patient and his son who was at the bedside.  Sitting up on the gurney, currently on oxygen per high flow nasal cannula.  He states that he had only been home a little over 1 day when he suddenly became very short of breath and felt like he was going to die.  Discussed heart failure.  Discussed lifestyle changes such as removing the saltshaker from the table, eating foods that lower in sodium.  Making  good choices with restaurant eating.  He states that he and his son have already started to read labels on various foods.  Aware of 2000 mg sodium restriction.  Also went over fluid restriction of no more than 64 ounces in a 24-hour period.  He states that he drinks at least 32 ounces of water 12 ounces of soda and a lot of  ice tea.  I asked him to quantify but he would only say it is a lot of tea.  Getting to his appointments and is not a problem, and he is able to afford his medications as long as they are on the [employer] $4 list.  He was given the living with heart failure teaching booklet, zone magnet, info on heart failure and low-sodium along with weight chart.  He was given a large scale and blood pressure machine from TOC. Documents signed and patient was given copy.  Will  following up in the outpatient heart failure clinic on Friday, November 17 at [place]

## 2022-08-16 NOTE — ED Notes (Signed)
RT called for Bipap. State they are working on getting machine for patient. RN to place pt on 6L humidified oxygen in meantime. RT to come and collect ABG as well. Providers made aware.

## 2022-08-16 NOTE — ED Notes (Signed)
Admitting at bedside 

## 2022-08-16 NOTE — Assessment & Plan Note (Addendum)
Hypertensive emergency Acute respiratory failure with hypoxia Possible bronchitis Patient presented with acute respiratory distress initially on NRB now requiring 2 L to maintain sats in the mid 90s Trigger for acute CHF possibly related to hypertensive urgency.  Viral infection uncertain. Could have underlying COPD.  Patient is a prior smoker Recent echocardiogram with EF 70% but indeterminate diastolic parameters Troponin and BNP WNL and chest x-ray showing improvement and prior vascular congestion Continue IV Lasix, nitroglycerin ointment Daily weights with intake and output monitoring BP control with home lisinopril and HCTZ with IV hydralazine as needed DuoNebs as needed Will benefit from outpatient PFTs as well as sleep study

## 2022-08-16 NOTE — ED Notes (Signed)
Pt given hospital bed for comfort.  

## 2022-08-16 NOTE — Evaluation (Signed)
Physical Therapy Evaluation Patient Details Name: Christopher Keith MRN: [id number] DOB: 10-12-1985 Today's Date: 08/16/2022  History of Present Illness  Pt is a 36 year old male admitted with the ED with acute CHR, hypertensive emergency, acute respiratory failure with hypoxia, possible bronchitits; PMH significant for Morbid obesity, BMI over 60 and hypertension, hospitalized from 11/7 to 11/8 with acute CHF with respiratory failure, believed triggered by an acute viral infection, who improved and was weaned off of oxygen prior to discharge who returns to the ED a day later with shortness of breath, chest discomfort and wheezing.   Clinical Impression  Pt admitted with above diagnosis. Pt received upright sitting EoB with OT, agreeable to PT/OT co-eval. At baseline pt is indep with household and short community distances and working in Psychologist, educational. Pt currently between living arrangements currently with plan to d/c to sister's home.   To date session limited due to HFNC connected to wall. Pt stands with supervision and ambulates ~12' around bed at supervision level requiring a person supervision and additional person for Lines and leads management. Overall adequate strength and balance appreciated with gait but limited by cardiopulmonary deficits with pt endorsing SOB. SPO2 > 90% throughout mobility. OT educated pt on safe use of adequate DME for body habitus, breathing and energy conservation techniques. Pt left EOB with all needs in reach. Pt currently with functional limitations due to the deficits listed below (see PT Problem List). Pt will benefit from skilled PT to increase their independence and safety with mobility to allow discharge to the venue listed below.         Recommendations for follow up therapy are one component of a multi-disciplinary discharge planning process, led by the attending physician.  Recommendations may be updated based on patient status, additional functional criteria  and insurance authorization.  Follow Up Recommendations Home health PT      Assistance Recommended at Discharge Intermittent Supervision/Assistance  Patient can return home with the following  A little help with walking and/or transfers;Assist for transportation;Assistance with cooking/housework;A little help with bathing/dressing/bathroom;Help with stairs or ramp for entrance    Equipment Recommendations None recommended by PT  Recommendations for Other Services       Functional Status Assessment Patient has had a recent decline in their functional status and demonstrates the ability to make significant improvements in function in a reasonable and predictable amount of time.     Precautions / Restrictions Precautions Precautions: Fall Precaution Comments: watch HR and spo2 Restrictions Weight Bearing Restrictions: No      Mobility  Bed Mobility Overal bed mobility: Needs Assistance Bed Mobility: Supine to Sit, Sit to Supine     Supine to sit: Supervision Sit to supine: Supervision     Patient Response: Cooperative  Transfers Overall transfer level: Needs assistance Equipment used: None Transfers: Sit to/from Stand Sit to Stand: Supervision                Ambulation/Gait Ambulation/Gait assistance: Supervision Gait Distance (Feet): 12 Feet Assistive device: None Gait Pattern/deviations: Step-to pattern       General Gait Details: Slow but steady gait around EOB. Wide BOS requiring increased sway onto stance leg to progress limb in swing phase due to body habiuts.  Stairs            Wheelchair Mobility    Modified Rankin (Stroke Patients Only)       Balance Overall balance assessment: Needs assistance Sitting-balance support: Feet supported Sitting balance-Leahy Scale: Good  Standing balance support: During functional activity Standing balance-Leahy Scale: Good                               Pertinent Vitals/Pain Pain  Assessment Pain Assessment: No/denies pain    Home Living Family/patient expects to be discharged to:: Private residence Living Arrangements: Other (Comment) (d/c to sister's house) Available Help at Discharge: Family;Friend(s);Available PRN/intermittently Type of Home: Mobile home Home Access: Stairs to enter Entrance Stairs-Rails: None Entrance Stairs-Number of Steps: 3 total, 2 to porch, 1 into house   Home Layout: One level Home Equipment: None Additional Comments: home set up provided for sisters house where pt reports he plans to discharge    Prior Function Prior Level of Function : Independent/Modified Independent             Mobility Comments: limited community distances, no AD ADLs 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, indep in Conservation officer, historic buildings        Extremity/Trunk Assessment   Upper Extremity Assessment Upper Extremity Assessment: Overall WFL for tasks assessed    Lower Extremity Assessment Lower Extremity Assessment: Overall WFL for tasks assessed    Cervical / Trunk Assessment Cervical / Trunk Assessment: Kyphotic  Communication   Communication: No difficulties  Cognition Arousal/Alertness: Awake/alert Behavior During Therapy: WFL for tasks assessed/performed Overall Cognitive Status: Within Functional Limits for tasks assessed                                 General Comments: Very pleasant and cooperative. Open to education provided by PT/OT.        General Comments General comments (skin integrity, edema, etc.): SPO2 > 90% on HFNC with max HR in 120's with mobility    Exercises     Assessment/Plan    PT Assessment Patient needs continued PT services  PT Problem List Decreased strength;Obesity;Decreased activity tolerance;Decreased mobility;Cardiopulmonary status limiting activity       PT Treatment Interventions DME instruction;Balance training;Gait training;Neuromuscular re-education;Stair training;Functional  mobility training;Patient/family education;Therapeutic activities;Therapeutic exercise    PT Goals (Current goals can be found in the Care Plan section)  Acute Rehab PT Goals Patient Stated Goal: improve breathing PT Goal Formulation: With patient Time For Goal Achievement: 08/30/22 Potential to Achieve Goals: Good    Frequency Min 2X/week     Co-evaluation PT/OT/SLP Co-Evaluation/Treatment: Yes Reason for Co-Treatment: Complexity of the patient's impairments (multi-system involvement);For patient/therapist safety PT goals addressed during session: Mobility/safety with mobility OT goals addressed during session: ADL's and self-care       AM-PAC PT "6 Clicks" Mobility  Outcome Measure Help needed turning from your back to your side while in a flat bed without using bedrails?: A Little Help needed moving from lying on your back to sitting on the side of a flat bed without using bedrails?: A Little Help needed moving to and from a bed to a chair (including a wheelchair)?: A Little Help needed standing up from a chair using your arms (e.g., wheelchair or bedside chair)?: None Help needed to walk in hospital room?: A Little Help needed climbing 3-5 steps with a railing? : A Lot 6 Click Score: 18    End of Session Equipment Utilized During Treatment: Oxygen Activity Tolerance: Patient tolerated treatment well Patient left: in bed;with call bell/phone within reach;with family/visitor present (seated EOB) Nurse Communication: Mobility status PT Visit Diagnosis: Other abnormalities  of gait and mobility (R26.89);Muscle weakness (generalized) (M62.81);Difficulty in walking, not elsewhere classified (R26.2)    Time: 4035-2481 PT Time Calculation (min) (ACUTE ONLY): 17 min   Charges:   PT Evaluation $PT Eval Moderate Complexity: 1 Mod          Pati Thinnes M. Fairly IV, PT, DPT Physical Therapist- [facility]  08/16/2022, 2:04 PM

## 2022-08-16 NOTE — Assessment & Plan Note (Signed)
Complicating factor to overall prognosis and care 

## 2022-08-16 NOTE — ED Notes (Signed)
Pt given lunch tray.

## 2022-08-17 LAB — BASIC METABOLIC PANEL
Anion gap: 8 (ref 5–15)
BUN: 21 mg/dL — ABNORMAL HIGH (ref 6–20)
CO2: 29 mmol/L (ref 22–32)
Calcium: 9.1 mg/dL (ref 8.9–10.3)
Chloride: 102 mmol/L (ref 98–111)
Creatinine, Ser: 0.61 mg/dL (ref 0.61–1.24)
GFR, Estimated: >60 mL/min (ref >60)
Glucose, Bld: 152 mg/dL — ABNORMAL HIGH (ref 70–99)
Potassium: 4.1 mmol/L (ref 3.5–5.1)
Sodium: 139 mmol/L (ref 135–145)

## 2022-08-17 MED ORDER — SALINE SPRAY 0.65 % NA SOLN
1.0000 | NASAL | Status: DC | PRN
  Administered 2022-08-18: 1 via NASAL
  Filled 2022-08-17: qty 44

## 2022-08-17 MED ORDER — LISINOPRIL 20 MG PO TABS
20.0000 mg | ORAL_TABLET | Freq: Every day | ORAL | Status: DC
  Administered 2022-08-17 – 2022-08-18 (×2): 20 mg via ORAL
  Filled 2022-08-17 (×2): qty 1

## 2022-08-17 MED ORDER — FUROSEMIDE 10 MG/ML IJ SOLN
40.0000 mg | Freq: Two times a day (BID) | INTRAMUSCULAR | Status: DC
  Administered 2022-08-17 – 2022-08-18 (×3): 40 mg via INTRAVENOUS
  Filled 2022-08-17 (×3): qty 4

## 2022-08-17 NOTE — Progress Notes (Signed)
Nutrition Brief Note  RD received consult for nutritional assessment  36 y/o male with h/o HTN, CHF, bronchitis and morbid obesity who is admitted with dyspnea.   Spoke with pt via phone. Pt reports that he has been doing well with his diet since receiving diet education during his last admission. Pt is requesting additional information and paperwork. Pt reports good appetite and oral intake pta and in hospital. RD will provide pt with additional education handouts per pt's request.   Wt Readings from Last 15 Encounters:  08/15/22 (!) 210.5 kg  08/13/22 (!) 211.8 kg  07/19/20 (!) 193.4 kg  02/23/19 (!) 209.8 kg  02/22/19 (!) 209.8 kg   Body mass index is 72.68 kg/m. Patient meets criteria for morbid obesity based on current BMI.   Current diet order is HH, patient is consuming approximately 100% of meals at this time. Labs and medications reviewed.   No nutrition interventions warranted at this time. If nutrition issues arise, please consult RD.   Betsey Holiday MS, RD, LDN Please refer to [facility] for RD and/or RD on-call/weekend/after hours pager

## 2022-08-17 NOTE — Progress Notes (Signed)
PROGRESS NOTE    Christopher Keith  ZOX:[id number] DOB: [birth year] DOA: 08/15/2022 PCP: Patient, No Pcp Per      Brief Narrative:   From admission h and p Christopher Keith is a 36 y.o. male with medical history significant for Morbid obesity, BMI over 60 and hypertension, hospitalized from 11/7 to 11/8 with acute CHF with respiratory failure, believed triggered by an acute viral infection, who improved and was weaned off of oxygen prior to discharge who returns to the ED a day later with shortness of breath, chest discomfort and wheezing.  He has a congested cough but is unable to bring up anything.denies fever or chills.  Recent COVID and flu were negative.  Echocardiogram was mostly unremarkable with EF 65 to 70% but unable to estimate diastolic parameters. ED course and data review: BP 181/112 on arrival with pulse 94, respirations 22.  O2 sat 96% on NRB.  He was weaned off NRB to 6 L and then off but desatted to 88% and was placed back on O2 at 2 L.  COVID and flu were negative.  Troponin 7, BNP 12.  WBC 14,000.EKG, personally viewed and interpreted showed sinus at 89 with RBBB and LAFB.  Chest x-ray showed slight improvement in cardiomegaly and vascular congestion from prior exam without new abnormality. Patient was treated with IV Lasix, chewable aspirin, morphine and NTG ointment applied.  Also received a DuoNeb treatment.  Hospitalist consulted for admission.    Assessment & Plan:   Principal Problem:   Acute CHF (congestive heart failure) (HCC) Active Problems:   Acute bronchitis   Acute respiratory failure with hypoxia (HCC)   Tobacco use   Hypertension   Morbid obesity with BMI of 60.0-69.9, adult (HCC)   Hypertensive urgency   CHF exacerbation (HCC)   # Acute hypoxic hypercarbic respiratory failure Here tachypnic and hypoxic. Likely multifactorial, as discussed below. Says he doesn't tolerate bipap. Treated with high-flow nasal cannula yesterday, now weaned off and breathing comfortably  on room air - monitor  # Dyspnea # Reactive airway disease Given wheeze and response to bronchodilators think there is likely a component of reactive airway disease and so will continue steroids and bronchodilators. Am not fully convinced this is chf given normal BNPs and normal TTE 2 days ago though some vascular congestion is seen on CXR - will continue diuretics as kidney function allows. No chest pain and normal troponins, doubt acs. No focal infiltrate or fever to suggest pneumonia, covid neg. Procal wnl on 11/8. Some degree of hypercarbia likely contributes though baseline co2 is not significantly elevated. CTA neg for mass, PE, does show small airway disease. Respiratory panel negative. - methylpred - duonebs - lasix IV 40 bid  # Hypertensive urgency # Chronic hypertension Systolics in the 190s yesterday, treated with nitroprusside gtt given concern for possible chf. Urgency resolved and nitro weaned off.  - cont amlodipine - resume home lisinopril - hydral prn  # Morbid obesity Complicates care  DVT prophylaxis: lovenox Code Status: full Family Communication: none at bedside, doesn't request I call to update anyone  Level of care: Progressive Status is: Inpatient Remains inpatient appropriate because: severity of illness    Consultants:  none  Procedures: none  Antimicrobials:  none    Subjective: Dyspnea much improved but some pleuritic pain on the left when takes a big breath  Objective: Vitals:   08/17/22 0745 08/17/22 0800 08/17/22 0812 08/17/22 0934  BP: (!) 172/88 (!) 141/77  (!) 156/81  Pulse: 68 (!)  59  86  Resp:    19  Temp:   97.8 F (36.6 C) 98.4 F (36.9 C)  TempSrc:   Oral   SpO2: 97% 96%  93%  Weight:      Height:       No intake or output data in the 24 hours ending 08/17/22 1157  Filed Weights   08/15/22 2314  Weight: (!) 210.5 kg    Examination:  General exam: NAD Respiratory system: faint exp wheeze Cardiovascular system:  S1 & S2 heard, tachycardic.   Gastrointestinal system: Abdomen is obese, soft and nontender. No organomegaly or masses felt.   Central nervous system: Alert and oriented. No focal neurological deficits. Extremities: Symmetric 5 x 5 power. 1+ LE edema Skin: No rashes, lesions or ulcers Psychiatry: Judgement and insight appear normal. Mood & affect appropriate.     Data Reviewed: I have personally reviewed following labs and imaging studies  CBC: Recent Labs  Lab 08/13/22 2202 08/14/22 0317 08/15/22 2339  WBC 11.0* 10.4 14.2*  NEUTROABS  --   --  8.5*  HGB 14.9 15.6 16.0  HCT 45.9 48.2 50.4  MCV 85.5 85.0 86.4  PLT 326 351 384   Basic Metabolic Panel: Recent Labs  Lab 08/13/22 2202 08/14/22 0317 08/15/22 2339 08/17/22 0506  NA 140 136 141 139  K 3.5 4.0 3.6 4.1  CL 104 102 104 102  CO2 27 26 28 29   GLUCOSE 150* 191* 116* 152*  BUN 15 13 24* 21*  CREATININE 0.63 0.63 0.73 0.61  CALCIUM 8.8* 9.0 8.9 9.1   GFR: Estimated Creatinine Clearance: 219.4 mL/min (by C-G formula based on SCr of 0.61 mg/dL). Liver Function Tests: Recent Labs  Lab 08/13/22 2202 08/15/22 2339  AST 24 21  ALT 27 29  ALKPHOS 74 77  BILITOT 0.7 0.7  PROT 7.4 7.8  ALBUMIN 3.4* 3.7   No results for input(s): "LIPASE", "AMYLASE" in the last 168 hours. No results for input(s): "AMMONIA" in the last 168 hours. Coagulation Profile: No results for input(s): "INR", "PROTIME" in the last 168 hours. Cardiac Enzymes: No results for input(s): "CKTOTAL", "CKMB", "CKMBINDEX", "TROPONINI" in the last 168 hours. BNP (last 3 results) No results for input(s): "PROBNP" in the last 8760 hours. HbA1C: No results for input(s): "HGBA1C" in the last 72 hours.  CBG: No results for input(s): "GLUCAP" in the last 168 hours. Lipid Profile: No results for input(s): "CHOL", "HDL", "LDLCALC", "TRIG", "CHOLHDL", "LDLDIRECT" in the last 72 hours. Thyroid Function Tests: No results for input(s): "TSH", "T4TOTAL",  "FREET4", "T3FREE", "THYROIDAB" in the last 72 hours. Anemia Panel: No results for input(s): "VITAMINB12", "FOLATE", "FERRITIN", "TIBC", "IRON", "RETICCTPCT" in the last 72 hours. Urine analysis: No results found for: "COLORURINE", "APPEARANCEUR", "LABSPEC", "PHURINE", "GLUCOSEU", "HGBUR", "BILIRUBINUR", "KETONESUR", "PROTEINUR", "UROBILINOGEN", "NITRITE", "LEUKOCYTESUR" Sepsis Labs: @LABRCNTIP (procalcitonin:4,lacticidven:4)  ) Recent Results (from the past 240 hour(s))  Resp Panel by RT-PCR (Flu A&B, Covid) Anterior Nasal Swab     Status: None   Collection Time: 08/13/22 10:02 PM   Specimen: Anterior Nasal Swab  Result Value Ref Range Status   SARS Coronavirus 2 by RT PCR NEGATIVE NEGATIVE Final    Comment: (NOTE) SARS-CoV-2 target nucleic acids are NOT DETECTED.  The SARS-CoV-2 RNA is generally detectable in upper respiratory specimens during the acute phase of infection. The lowest concentration of SARS-CoV-2 viral copies this assay can detect is 138 copies/mL. A negative result does not preclude SARS-Cov-2 infection and should not be used as the sole basis for  treatment or other patient management decisions. A negative result may occur with  improper specimen collection/handling, submission of specimen other than nasopharyngeal swab, presence of viral mutation(s) within the areas targeted by this assay, and inadequate number of viral copies(<138 copies/mL). A negative result must be combined with clinical observations, patient history, and epidemiological information. The expected result is Negative.  Fact Sheet for Patients:  BloggerCourse.com  Fact Sheet for Healthcare Providers:  SeriousBroker.it  This test is no t yet approved or cleared by the [place] FDA and  has been authorized for detection and/or diagnosis of SARS-CoV-2 by FDA under an Emergency Use Authorization (EUA). This EUA will remain  in effect (meaning  this test can be used) for the duration of the COVID-19 declaration under Section 564(b)(1) of the Act, 21 U.S.C.section 360bbb-3(b)(1), unless the authorization is terminated  or revoked sooner.       Influenza A by PCR NEGATIVE NEGATIVE Final   Influenza B by PCR NEGATIVE NEGATIVE Final    Comment: (NOTE) The Xpert Xpress SARS-CoV-2/FLU/RSV plus assay is intended as an aid in the diagnosis of influenza from Nasopharyngeal swab specimens and should not be used as a sole basis for treatment. Nasal washings and aspirates are unacceptable for Xpert Xpress SARS-CoV-2/FLU/RSV testing.  Fact Sheet for Patients: BloggerCourse.com  Fact Sheet for Healthcare Providers: SeriousBroker.it  This test is not yet approved or cleared by the [place] FDA and has been authorized for detection and/or diagnosis of SARS-CoV-2 by FDA under an Emergency Use Authorization (EUA). This EUA will remain in effect (meaning this test can be used) for the duration of the COVID-19 declaration under Section 564(b)(1) of the Act, 21 U.S.C. section 360bbb-3(b)(1), unless the authorization is terminated or revoked.  Performed at [facility], [address] [place], [place] [address]   Resp Panel by RT-PCR (Flu A&B, Covid) Anterior Nasal Swab     Status: None   Collection Time: 08/15/22 11:39 PM   Specimen: Anterior Nasal Swab  Result Value Ref Range Status   SARS Coronavirus 2 by RT PCR NEGATIVE NEGATIVE Final    Comment: (NOTE) SARS-CoV-2 target nucleic acids are NOT DETECTED.  The SARS-CoV-2 RNA is generally detectable in upper respiratory specimens during the acute phase of infection. The lowest concentration of SARS-CoV-2 viral copies this assay can detect is 138 copies/mL. A negative result does not preclude SARS-Cov-2 infection and should not be used as the sole basis for treatment or other patient management decisions. A negative  result may occur with  improper specimen collection/handling, submission of specimen other than nasopharyngeal swab, presence of viral mutation(s) within the areas targeted by this assay, and inadequate number of viral copies(<138 copies/mL). A negative result must be combined with clinical observations, patient history, and epidemiological information. The expected result is Negative.  Fact Sheet for Patients:  BloggerCourse.com  Fact Sheet for Healthcare Providers:  SeriousBroker.it  This test is no t yet approved or cleared by the [place] FDA and  has been authorized for detection and/or diagnosis of SARS-CoV-2 by FDA under an Emergency Use Authorization (EUA). This EUA will remain  in effect (meaning this test can be used) for the duration of the COVID-19 declaration under Section 564(b)(1) of the Act, 21 U.S.C.section 360bbb-3(b)(1), unless the authorization is terminated  or revoked sooner.       Influenza A by PCR NEGATIVE NEGATIVE Final   Influenza B by PCR NEGATIVE NEGATIVE Final    Comment: (NOTE) The Xpert Xpress SARS-CoV-2/FLU/RSV plus  assay is intended as an aid in the diagnosis of influenza from Nasopharyngeal swab specimens and should not be used as a sole basis for treatment. Nasal washings and aspirates are unacceptable for Xpert Xpress SARS-CoV-2/FLU/RSV testing.  Fact Sheet for Patients: BloggerCourse.com  Fact Sheet for Healthcare Providers: SeriousBroker.it  This test is not yet approved or cleared by the [place] FDA and has been authorized for detection and/or diagnosis of SARS-CoV-2 by FDA under an Emergency Use Authorization (EUA). This EUA will remain in effect (meaning this test can be used) for the duration of the COVID-19 declaration under Section 564(b)(1) of the Act, 21 U.S.C. section 360bbb-3(b)(1), unless the authorization is  terminated or revoked.  Performed at [facility], [address] [place], [place] [address]   Respiratory (~20 pathogens) panel by PCR     Status: None   Collection Time: 08/16/22  6:55 AM   Specimen: Nasopharyngeal Swab; Respiratory  Result Value Ref Range Status   Adenovirus NOT DETECTED NOT DETECTED Final   Coronavirus 229E NOT DETECTED NOT DETECTED Final    Comment: (NOTE) The Coronavirus on the Respiratory Panel, DOES NOT test for the novel  Coronavirus (2019 nCoV)    Coronavirus HKU1 NOT DETECTED NOT DETECTED Final   Coronavirus NL63 NOT DETECTED NOT DETECTED Final   Coronavirus OC43 NOT DETECTED NOT DETECTED Final   Metapneumovirus NOT DETECTED NOT DETECTED Final   Rhinovirus / Enterovirus NOT DETECTED NOT DETECTED Final   Influenza A NOT DETECTED NOT DETECTED Final   Influenza B NOT DETECTED NOT DETECTED Final   Parainfluenza Virus 1 NOT DETECTED NOT DETECTED Final   Parainfluenza Virus 2 NOT DETECTED NOT DETECTED Final   Parainfluenza Virus 3 NOT DETECTED NOT DETECTED Final   Parainfluenza Virus 4 NOT DETECTED NOT DETECTED Final   Respiratory Syncytial Virus NOT DETECTED NOT DETECTED Final   Bordetella pertussis NOT DETECTED NOT DETECTED Final   Bordetella Parapertussis NOT DETECTED NOT DETECTED Final   Chlamydophila pneumoniae NOT DETECTED NOT DETECTED Final   Mycoplasma pneumoniae NOT DETECTED NOT DETECTED Final    Comment: Performed at [facility], [address] [place], [place] [address]         Radiology Studies: CT Angio Chest Pulmonary Embolism (PE) W or WO Contrast  Result Date: 08/16/2022 CLINICAL DATA:  Shortness of breath EXAM: CT ANGIOGRAPHY CHEST WITH CONTRAST TECHNIQUE: Multidetector CT imaging of the chest was performed using the standard protocol during bolus administration of intravenous contrast. Multiplanar CT image reconstructions and MIPs were obtained to evaluate the vascular anatomy. RADIATION DOSE REDUCTION: This exam  was performed according to the departmental dose-optimization program which includes automated exposure control, adjustment of the mA and/or kV according to patient size and/or use of iterative reconstruction technique. CONTRAST:  OMNIPAQUE IOHEXOL 350 MG/ML SOLN COMPARISON:  None Available. FINDINGS: Cardiovascular: Normal heart size. No pericardial effusion. Normal caliber thoracic aorta with no atherosclerotic disease. Coronary artery calcifications. No evidence pulmonary embolus to the level of the proximal lobar arteries. Evaluation of the distal lobar, segmental, and subsegmental pulmonary arteries is limited due to bolus timing, motion artifact and patient body habitus. Mediastinum/Nodes: Small hiatal hernia. No pathologically enlarged lymph nodes seen in the chest. Lungs/Pleura: Central airways are patent. Bilateral mosaic attenuation. Linear opacities of the lingula, likely due to scarring or atelectasis. No consolidation, pleural effusion or pneumothorax solid right middle lobe pulmonary nodule measuring 4 mm in mean diameter on series 7, image 276 Upper Abdomen: No acute abnormality. Musculoskeletal:  No chest wall abnormality. No acute or significant osseous findings. Review of the MIP images confirms the above findings. IMPRESSION: 1. No evidence pulmonary embolus to the level of the proximal lobar arteries. 2. Bilateral mosaic attenuation, likely air trapping related to small airways disease. 3. Solid right middle lobe pulmonary nodule measuring 4 mm in mean diameter. No follow-up needed if patient is low-risk.This recommendation follows the consensus statement: Guidelines for Management of Incidental Pulmonary Nodules Detected on CT Images: From the Fleischner Society 2017; Radiology 2017; 284:228-243. Electronically Signed   By: Allegra Lai M.D.   On: 08/16/2022 09:21   DG Chest Port 1 View  Result Date: 08/15/2022 CLINICAL DATA:  Shortness of breath. Respiratory distress. EXAM:  PORTABLE CHEST 1 VIEW COMPARISON:  Radiograph 2 days ago 08/13/2022 FINDINGS: Slight improvement in cardiomegaly and vascular congestion from prior exam. No new airspace disease. No pneumothorax or large pleural effusion. Detailed assessment limited by soft tissue attenuation from habitus. IMPRESSION: Slight improvement in cardiomegaly and vascular congestion from prior exam. No new abnormality. Electronically Signed   By: Narda Rutherford M.D.   On: 08/15/2022 23:40        Scheduled Meds:  amLODipine  10 mg Oral Daily   enoxaparin (LOVENOX) injection  0.5 mg/kg Subcutaneous Q24H   furosemide  40 mg Intravenous BID   guaiFENesin  1,200 mg Oral BID   ipratropium-albuterol  3 mL Nebulization Q6H   lisinopril  20 mg Oral Daily   methylPREDNISolone (SOLU-MEDROL) injection  40 mg Intravenous Q12H   Continuous Infusions:     LOS: 1 day     Silvano Bilis, MD [facility]   If 7PM-7AM, please contact night-coverage [email or web address] Password [facility] 08/17/2022, 11:57 AM

## 2022-08-18 LAB — BASIC METABOLIC PANEL
Anion gap: 6 (ref 5–15)
BUN: 22 mg/dL — ABNORMAL HIGH (ref 6–20)
CO2: 30 mmol/L (ref 22–32)
Calcium: 8.9 mg/dL (ref 8.9–10.3)
Chloride: 102 mmol/L (ref 98–111)
Creatinine, Ser: 0.54 mg/dL — ABNORMAL LOW (ref 0.61–1.24)
GFR, Estimated: >60 mL/min (ref >60)
Glucose, Bld: 150 mg/dL — ABNORMAL HIGH (ref 70–99)
Potassium: 4 mmol/L (ref 3.5–5.1)
Sodium: 138 mmol/L (ref 135–145)

## 2022-08-18 MED ORDER — FUROSEMIDE 40 MG PO TABS: 40.0000 mg | ORAL_TABLET | Freq: Every day | ORAL | 11 refills | Status: DC

## 2022-08-18 MED ORDER — IPRATROPIUM-ALBUTEROL 0.5-2.5 (3) MG/3ML IN SOLN
3.0000 mL | Freq: Three times a day (TID) | RESPIRATORY_TRACT | Status: DC
  Administered 2022-08-18: 3 mL via RESPIRATORY_TRACT
  Filled 2022-08-18 (×2): qty 3

## 2022-08-18 MED ORDER — DOXYCYCLINE HYCLATE 100 MG PO CAPS: 100.0000 mg | ORAL_CAPSULE | Freq: Two times a day (BID) | ORAL | 0 refills | Status: DC

## 2022-08-18 MED ORDER — PREDNISONE 20 MG PO TABS: 20.0000 mg | ORAL_TABLET | Freq: Every day | ORAL | 0 refills | Status: DC

## 2022-08-18 MED ORDER — AMLODIPINE BESYLATE 10 MG PO TABS: 10.0000 mg | ORAL_TABLET | Freq: Every day | ORAL | 1 refills | Status: DC

## 2022-08-18 NOTE — Progress Notes (Cosign Needed)
Occupational Therapy * Physical Therapy * Speech Therapy  DATE 08/18/2022 PATIENT NAME Christopher Keith PATIENT MRN [id number]  DIAGNOSIS/DIAGNOSIS CODE - CHF DATE OF DISCHARGE 08/18/2022  PRIMARY CARE PHYSICIAN  PCP PHONE/FAX   Dear Provider (Name: [facility] Fax: [phone] ):   I certify that I have examined this patient and that occupational/physical/speech therapy is necessary on an outpatient basis.    The patient has expressed interest in completing their recommended course of therapy at your location.  Once a formal order from the patient's primary care physician has been obtained, please contact him/her to schedule an appointment for evaluation at your earliest convenience.  [ x ]  Physical Therapy Evaluate and Treat  [ x ]  Occupational Therapy Evaluate and Treat  [  ]  Speech Therapy Evaluate and Treat  The patient's primary care physician (listed above) must furnish and be responsible for a formal order such that the recommended services may be furnished while under the primary physician's care, and that the plan of care will be established and reviewed every 30 days (or more often if condition necessitates).

## 2022-08-18 NOTE — TOC Transition Note (Signed)
Transition of Care [facility]) - CM/SW Discharge Note   Patient Details  Name: Tadarrius Burch MRN: [id number] Date of Birth: 1985-12-06  Transition of Care [facility]) CM/SW Contact:  Kemper Durie, RN Phone Number: 08/18/2022, 1:37 PM   Clinical Narrative:     Spoke with patient, admitted from home, lives alone.  Does not have PCP, provided with list of primary provider options.  Obtains medications from [facility].  Does not have any DME in the home, but was provided blood pressure monitor and scale at discharge.  He is aware of HHPT/OT recommendations, however difficulty finding agency to accept Medicaid ([facility], [place], [place], and [facility] all declined).  He agrees to outpatient rehab services at [facility] main [facility].  Will place referral.    Final next level of care: Home/Self Care Barriers to Discharge: Barriers Resolved   Patient Goals and CMS Choice Patient states their goals for this hospitalization and ongoing recovery are:: Home CMS Medicare.gov Compare Post Acute Care list provided to:: Patient Choice offered to / list presented to : Patient  Discharge Placement                       Discharge Plan and Services                                     Social Determinants of Health (SDOH) Interventions     Readmission Risk Interventions     No data to display

## 2022-08-18 NOTE — Discharge Summary (Addendum)
Christopher Keith AVW:[id number] DOB: 1985-11-07 DOA: 08/15/2022  PCP: Christopher Keith  Admit date: 08/15/2022 Discharge date: 08/18/2022  Time spent: 35 minutes  Recommendations for Outpatient Follow-up:  Establish with pcp Pulmonology f/u Check of kidney function and electrolytes in ~2 wks    Discharge Diagnoses:  Principal Problem:   Acute respiratory failure with hypoxia (HCC) Active Problems:   Acute CHF (congestive heart failure) (HCC)   Acute bronchitis   Tobacco use   Hypertension   Morbid obesity with BMI of 60.0-69.9, adult (HCC)   Hypertensive urgency   CHF exacerbation (HCC)   Discharge Condition: stable  Diet recommendation: heart healthy  Filed Weights   08/15/22 2314  Weight: (!) 210.5 kg    History of present illness:  From admission h and p Christopher Keith is a 36 y.o. male with medical history significant for Morbid obesity, BMI over 60 and hypertension, hospitalized from 11/7 to 11/8 with acute CHF with respiratory failure, believed triggered by an acute viral infection, who improved and was weaned off of oxygen prior to discharge who returns to the ED a day later with shortness of breath, chest discomfort and wheezing.  He has a congested cough but is unable to bring up anything.denies fever or chills.  Recent COVID and flu were negative.  Echocardiogram was mostly unremarkable with EF 65 to 70% but unable to estimate diastolic parameters. ED course and data review: BP 181/112 on arrival with pulse 94, respirations 22.  O2 sat 96% on NRB.  He was weaned off NRB to 6 L and then off but desatted to 88% and was placed back on O2 at 2 L.  COVID and flu were negative.  Troponin 7, BNP 12.  WBC 14,000.EKG, personally viewed and interpreted showed sinus at 89 with RBBB and LAFB.  Chest x-ray showed slight improvement in cardiomegaly and vascular congestion from prior exam without new abnormality. Patient was treated with IV Lasix, chewable aspirin, morphine and NTG ointment  applied.  Also received a DuoNeb treatment.  Hospitalist consulted for admission.   Hospital Course:  Patient presented 11/9 with hypertensive urgency and acute hypoxic hypercarbic respiratory failure. Same presentation on 11/7 for which he was admitted overnight. Etiology appears to be exacerbation of reactive airway disease, possible chf. Here was treated with high-flow nasal cannula and nitroprusside gtt and iv lasix and iv solumedrol. Symptoms improved promptly and on same day as admission was weaned off oxygen and nitroprusside gtt and hypertensive urgency resolved. Remained breathing comfortably with normal BP and no O2 requirement for greater than 24 hours. Tolerated IV diuresis. Covid negative, respiratory viral panel negative. Procalcitonin low, cta negative for PE did show small airway disease. Will discharge with oral prednisone, oral lasix, as well as doxycycline which will assist with possible atypical infection and and also assist with its anti-inflammatory properties, though still with relatively low suspicion of bacterial pneumonia. Will need to establish with a pcp. I have also referred him to pulmonology. BP regimen will be lisinopril and amlodipine in addition to lasix. Patient's morbid obesity likely contributes to his respiratory difficulties though no significant baseline hypercarbia to suggest overt OHS. Of note patient does endorse vaping heavily vaping-induced lung injury may also contribute, did discuss importance of abstinence.  Procedures: none   Consultations: none  Discharge Exam: Vitals:   08/18/22 0531 08/18/22 0815  BP: 139/78 (!) 144/70  Pulse: (!) 58 66  Resp: 20 16  Temp: (!) 97.5 F (36.4 C) 97.7 F (36.5 C)  SpO2: 93% 96%    General: NAD Cardiovascular: RRR Respiratory: faint rales and wheezing  Discharge Instructions   Discharge Instructions     Ambulatory referral to Pulmonology   Complete by: As directed    Reason for referral: Asthma/COPD    Diet - low sodium heart healthy   Complete by: As directed    Increase activity slowly   Complete by: As directed       Allergies as of 08/18/2022   No Known Allergies      Medication List     STOP taking these medications    hydrochlorothiazide 12.5 MG tablet Commonly known as: HYDRODIURIL       TAKE these medications    albuterol 108 (90 Base) MCG/ACT inhaler Commonly known as: VENTOLIN HFA Inhale 2 puffs into the lungs every 6 (six) hours as needed for wheezing or shortness of breath.   amLODipine 10 MG tablet Commonly known as: NORVASC Take 1 tablet (10 mg total) by mouth daily. Start taking on: August 19, 2022   doxycycline 100 MG capsule Commonly known as: VIBRAMYCIN Take 1 capsule (100 mg total) by mouth 2 (two) times daily.   furosemide 40 MG tablet Commonly known as: Lasix Take 1 tablet (40 mg total) by mouth daily.   lisinopril 20 MG tablet Commonly known as: ZESTRIL Take 1 tablet (20 mg total) by mouth daily.   predniSONE 20 MG tablet Commonly known as: DELTASONE Take 1 tablet (20 mg total) by mouth daily with breakfast.       No Known Allergies  Follow-up Information     [facility] Follow [facility].   Contact information: [address]  [place] [id number] [phone]                  The results of significant diagnostics from this hospitalization (including imaging, microbiology, ancillary and laboratory) are listed below for reference.    Significant Diagnostic Studies: CT Angio Chest Pulmonary Embolism (PE) W or WO Contrast  Result Date: 08/16/2022 CLINICAL DATA:  Shortness of breath EXAM: CT ANGIOGRAPHY CHEST WITH CONTRAST TECHNIQUE: Multidetector CT imaging of the chest was performed using the standard protocol during bolus administration of intravenous contrast. Multiplanar CT image reconstructions and MIPs were obtained to evaluate the vascular anatomy. RADIATION DOSE REDUCTION: This exam was  performed according to the departmental dose-optimization program which includes automated exposure control, adjustment of the mA and/or kV according to patient size and/or use of iterative reconstruction technique. CONTRAST:  100mL OMNIPAQUE IOHEXOL 350 MG/ML SOLN COMPARISON:  None Available. FINDINGS: Cardiovascular: Normal heart size. No pericardial effusion. Normal caliber thoracic aorta with no atherosclerotic disease. Coronary artery calcifications. No evidence pulmonary embolus to the level of the proximal lobar arteries. Evaluation of the distal lobar, segmental, and subsegmental pulmonary arteries is limited due to bolus timing, motion artifact and patient body habitus. Mediastinum/Nodes: Small hiatal hernia. No pathologically enlarged lymph nodes seen in the chest. Lungs/Pleura: Central airways are patent. Bilateral mosaic attenuation. Linear opacities of the lingula, likely due to scarring or atelectasis. No consolidation, pleural effusion or pneumothorax solid right middle lobe pulmonary nodule measuring 4 mm in mean diameter on series 7, image 276 Upper Abdomen: No acute abnormality. Musculoskeletal: No chest wall abnormality. No acute or significant osseous findings. Review of the MIP images confirms the above findings. IMPRESSION: 1. No evidence pulmonary embolus to the level of the proximal lobar arteries. 2. Bilateral mosaic attenuation, likely air trapping related to small airways disease. 3. Solid right  middle lobe pulmonary nodule measuring 4 mm in mean diameter. No follow-up needed if patient is low-risk.This recommendation follows the consensus statement: Guidelines for Management of Incidental Pulmonary Nodules Detected on CT Images: From the Fleischner Society 2017; Radiology 2017; 284:228-243. Electronically Signed   By: Allegra Lai M.D.   On: 08/16/2022 09:21   DG Chest Port 1 View  Result Date: 08/15/2022 CLINICAL DATA:  Shortness of breath. Respiratory distress. EXAM: PORTABLE  CHEST 1 VIEW COMPARISON:  Radiograph 2 days ago 08/13/2022 FINDINGS: Slight improvement in cardiomegaly and vascular congestion from prior exam. No new airspace disease. No pneumothorax or large pleural effusion. Detailed assessment limited by soft tissue attenuation from habitus. IMPRESSION: Slight improvement in cardiomegaly and vascular congestion from prior exam. No new abnormality. Electronically Signed   By: Narda Rutherford M.D.   On: 08/15/2022 23:40   ECHOCARDIOGRAM COMPLETE  Result Date: 08/14/2022    ECHOCARDIOGRAM REPORT   Patient Name:   Christopher Keith  Date of Exam: 08/14/2022 Medical Rec #:  [id number]  Height:       67.0 in Accession #:    [id number] Weight:       467.0 lb Date of Birth:  01-05-1986  BSA:          2.900 m Patient Age:    36 years   BP:           180/84 mmHg Patient Gender: M          HR:           66 bpm. Exam Location:  [facility] Procedure: 2D Echo, Cardiac Doppler and Color Doppler Indications:     CHF-acute diastolic I50.31  History:         Patient has no prior history of Echocardiogram examinations.                  Risk Factors:Hypertension.  Sonographer:     Cristela Blue Referring Phys:  [id number] JAN A MANSY Diagnosing Phys: Debbe Odea MD  Sonographer Comments: Technically challenging study due to limited acoustic windows, no apical window and no subcostal window. IMPRESSIONS  1. Left ventricular ejection fraction, by estimation, is 65 to 70%. The left ventricle has normal function. The left ventricle has no regional wall motion abnormalities. There is mild left ventricular hypertrophy. Left ventricular diastolic function could not be evaluated.  2. Right ventricular systolic function is normal. The right ventricular size is not well visualized.  3. The mitral valve is normal in structure. No evidence of mitral valve regurgitation.  4. The aortic valve was not well visualized. Aortic valve regurgitation is not visualized.  5. The inferior vena cava is normal in size with greater  than 50% respiratory variability, suggesting right atrial pressure of 3 mmHg. FINDINGS  Left Ventricle: Left ventricular ejection fraction, by estimation, is 65 to 70%. The left ventricle has normal function. The left ventricle has no regional wall motion abnormalities. The left ventricular internal cavity size was normal in size. There is  mild left ventricular hypertrophy. Left ventricular diastolic function could not be evaluated. Right Ventricle: The right ventricular size is not well visualized. No increase in right ventricular wall thickness. Right ventricular systolic function is normal. Left Atrium: Left atrial size was normal in size. Right Atrium: Right atrial size was not well visualized. Pericardium: There is no evidence of pericardial effusion. Mitral Valve: The mitral valve is normal in structure. No evidence of mitral valve regurgitation. Tricuspid Valve: The tricuspid valve is not well visualized.  Tricuspid valve regurgitation is not demonstrated. Aortic Valve: The aortic valve was not well visualized. Aortic valve regurgitation is not visualized. Pulmonic Valve: The pulmonic valve was not well visualized. Pulmonic valve regurgitation is not visualized. Aorta: The aortic root is normal in size and structure. Venous: The inferior vena cava is normal in size with greater than 50% respiratory variability, suggesting right atrial pressure of 3 mmHg. IAS/Shunts: No atrial level shunt detected by color flow Doppler.  LEFT VENTRICLE PLAX 2D LVIDd:         5.30 cm LVIDs:         3.20 cm LV PW:         1.40 cm LV IVS:        1.30 cm LVOT diam:     2.00 cm LVOT Area:     3.14 cm  LEFT ATRIUM         Index LA diam:    4.00 cm 1.38 cm/m   AORTA Ao Root diam: 2.90 cm  SHUNTS Systemic Diam: 2.00 cm Debbe Odea MD Electronically signed by Debbe Odea MD Signature Date/Time: 08/14/2022/3:13:35 PM    Final    DG Chest Portable 1 View  Result Date: 08/13/2022 CLINICAL DATA:  Shortness of breath and  diaphoresis. EXAM: PORTABLE CHEST 1 VIEW COMPARISON:  None Available. FINDINGS: The cardiac silhouette is mildly enlarged. There is mild prominence of the perihilar pulmonary vasculature. Mild atelectasis is seen within the left lung base. There is no evidence of a pleural effusion or pneumothorax. The visualized skeletal structures are unremarkable. IMPRESSION: 1. Cardiomegaly with mild pulmonary vascular congestion. 2. Mild left basilar atelectasis. Electronically Signed   By: Aram Candela M.D.   On: 08/13/2022 22:30    Microbiology: Recent Results (from the past 240 hour(s))  Resp Panel by RT-PCR (Flu A&B, Covid) Anterior Nasal Swab     Status: None   Collection Time: 08/13/22 10:02 PM   Specimen: Anterior Nasal Swab  Result Value Ref Range Status   SARS Coronavirus 2 by RT PCR NEGATIVE NEGATIVE Final    Comment: (NOTE) SARS-CoV-2 target nucleic acids are NOT DETECTED.  The SARS-CoV-2 RNA is generally detectable in upper respiratory specimens during the acute phase of infection. The lowest concentration of SARS-CoV-2 viral copies this assay can detect is 138 copies/mL. A negative result does not preclude SARS-Cov-2 infection and should not be used as the sole basis for treatment or other patient management decisions. A negative result may occur with  improper specimen collection/handling, submission of specimen other than nasopharyngeal swab, presence of viral mutation(s) within the areas targeted by this assay, and inadequate number of viral copies(<138 copies/mL). A negative result must be combined with clinical observations, patient history, and epidemiological information. The expected result is Negative.  Fact Sheet for Patients:  BloggerCourse.com  Fact Sheet for Healthcare Providers:  SeriousBroker.it  This test is no t yet approved or cleared by the [place] FDA and  has been authorized for detection and/or  diagnosis of SARS-CoV-2 by FDA under an Emergency Use Authorization (EUA). This EUA will remain  in effect (meaning this test can be used) for the duration of the COVID-19 declaration under Section 564(b)(1) of the Act, 21 U.S.C.section 360bbb-3(b)(1), unless the authorization is terminated  or revoked sooner.       Influenza A by PCR NEGATIVE NEGATIVE Final   Influenza B by PCR NEGATIVE NEGATIVE Final    Comment: (NOTE) The Xpert Xpress SARS-CoV-2/FLU/RSV plus assay is intended as an aid in  the diagnosis of influenza from Nasopharyngeal swab specimens and should not be used as a sole basis for treatment. Nasal washings and aspirates are unacceptable for Xpert Xpress SARS-CoV-2/FLU/RSV testing.  Fact Sheet for Patients: BloggerCourse.com  Fact Sheet for Healthcare Providers: SeriousBroker.it  This test is not yet approved or cleared by the [place] FDA and has been authorized for detection and/or diagnosis of SARS-CoV-2 by FDA under an Emergency Use Authorization (EUA). This EUA will remain in effect (meaning this test can be used) for the duration of the COVID-19 declaration under Section 564(b)(1) of the Act, 21 U.S.C. section 360bbb-3(b)(1), unless the authorization is terminated or revoked.  Performed at [facility], [address] [place], [place] [address]   Resp Panel by RT-PCR (Flu A&B, Covid) Anterior Nasal Swab     Status: None   Collection Time: 08/15/22 11:39 PM   Specimen: Anterior Nasal Swab  Result Value Ref Range Status   SARS Coronavirus 2 by RT PCR NEGATIVE NEGATIVE Final    Comment: (NOTE) SARS-CoV-2 target nucleic acids are NOT DETECTED.  The SARS-CoV-2 RNA is generally detectable in upper respiratory specimens during the acute phase of infection. The lowest concentration of SARS-CoV-2 viral copies this assay can detect is 138 copies/mL. A negative result does not preclude  SARS-Cov-2 infection and should not be used as the sole basis for treatment or other patient management decisions. A negative result may occur with  improper specimen collection/handling, submission of specimen other than nasopharyngeal swab, presence of viral mutation(s) within the areas targeted by this assay, and inadequate number of viral copies(<138 copies/mL). A negative result must be combined with clinical observations, patient history, and epidemiological information. The expected result is Negative.  Fact Sheet for Patients:  BloggerCourse.com  Fact Sheet for Healthcare Providers:  SeriousBroker.it  This test is no t yet approved or cleared by the [place] FDA and  has been authorized for detection and/or diagnosis of SARS-CoV-2 by FDA under an Emergency Use Authorization (EUA). This EUA will remain  in effect (meaning this test can be used) for the duration of the COVID-19 declaration under Section 564(b)(1) of the Act, 21 U.S.C.section 360bbb-3(b)(1), unless the authorization is terminated  or revoked sooner.       Influenza A by PCR NEGATIVE NEGATIVE Final   Influenza B by PCR NEGATIVE NEGATIVE Final    Comment: (NOTE) The Xpert Xpress SARS-CoV-2/FLU/RSV plus assay is intended as an aid in the diagnosis of influenza from Nasopharyngeal swab specimens and should not be used as a sole basis for treatment. Nasal washings and aspirates are unacceptable for Xpert Xpress SARS-CoV-2/FLU/RSV testing.  Fact Sheet for Patients: BloggerCourse.com  Fact Sheet for Healthcare Providers: SeriousBroker.it  This test is not yet approved or cleared by the [place] FDA and has been authorized for detection and/or diagnosis of SARS-CoV-2 by FDA under an Emergency Use Authorization (EUA). This EUA will remain in effect (meaning this test can be used) for the duration of  the COVID-19 declaration under Section 564(b)(1) of the Act, 21 U.S.C. section 360bbb-3(b)(1), unless the authorization is terminated or revoked.  Performed at [facility], [address] [place], [place] [address]   Respiratory (~20 pathogens) panel by PCR     Status: None   Collection Time: 08/16/22  6:55 AM   Specimen: Nasopharyngeal Swab; Respiratory  Result Value Ref Range Status   Adenovirus NOT DETECTED NOT DETECTED Final   Coronavirus 229E NOT DETECTED NOT DETECTED Final    Comment: (  NOTE) The Coronavirus on the Respiratory Panel, DOES NOT test for the novel  Coronavirus (2019 nCoV)    Coronavirus HKU1 NOT DETECTED NOT DETECTED Final   Coronavirus NL63 NOT DETECTED NOT DETECTED Final   Coronavirus OC43 NOT DETECTED NOT DETECTED Final   Metapneumovirus NOT DETECTED NOT DETECTED Final   Rhinovirus / Enterovirus NOT DETECTED NOT DETECTED Final   Influenza A NOT DETECTED NOT DETECTED Final   Influenza B NOT DETECTED NOT DETECTED Final   Parainfluenza Virus 1 NOT DETECTED NOT DETECTED Final   Parainfluenza Virus 2 NOT DETECTED NOT DETECTED Final   Parainfluenza Virus 3 NOT DETECTED NOT DETECTED Final   Parainfluenza Virus 4 NOT DETECTED NOT DETECTED Final   Respiratory Syncytial Virus NOT DETECTED NOT DETECTED Final   Bordetella pertussis NOT DETECTED NOT DETECTED Final   Bordetella Parapertussis NOT DETECTED NOT DETECTED Final   Chlamydophila pneumoniae NOT DETECTED NOT DETECTED Final   Mycoplasma pneumoniae NOT DETECTED NOT DETECTED Final    Comment: Performed at [facility], [address] [place], [place] [address]     Labs: Basic Metabolic Panel: Recent Labs  Lab 08/13/22 2202 08/14/22 0317 08/15/22 2339 08/17/22 0506 08/18/22 0445  NA 140 136 141 139 138  K 3.5 4.0 3.6 4.1 4.0  CL 104 102 104 102 102  CO2 27 26 28 29 30   GLUCOSE 150* 191* 116* 152* 150*  BUN 15 13 24* 21* 22*  CREATININE 0.63 0.63 0.73 0.61 0.54*  CALCIUM 8.8* 9.0 8.9  9.1 8.9   Liver Function Tests: Recent Labs  Lab 08/13/22 2202 08/15/22 2339  AST 24 21  ALT 27 29  ALKPHOS 74 77  BILITOT 0.7 0.7  PROT 7.4 7.8  ALBUMIN 3.4* 3.7   No results for input(s): "LIPASE", "AMYLASE" in the last 168 hours. No results for input(s): "AMMONIA" in the last 168 hours. CBC: Recent Labs  Lab 08/13/22 2202 08/14/22 0317 08/15/22 2339  WBC 11.0* 10.4 14.2*  NEUTROABS  --   --  8.5*  HGB 14.9 15.6 16.0  HCT 45.9 48.2 50.4  MCV 85.5 85.0 86.4  PLT 326 351 384   Cardiac Enzymes: No results for input(s): "CKTOTAL", "CKMB", "CKMBINDEX", "TROPONINI" in the last 168 hours. BNP: BNP (last 3 results) Recent Labs    08/13/22 2202 08/14/22 0317 08/15/22 2339  BNP 17.9 22.8 12.0    ProBNP (last 3 results) No results for input(s): "PROBNP" in the last 8760 hours.  CBG: No results for input(s): "GLUCAP" in the last 168 hours.     Signed:  13/09/23 MD.  [facility] 08/18/2022, 10:19 AM

## 2022-08-21 ENCOUNTER — Encounter: Payer: Self-pay | Admitting: Radiology

## 2022-08-21 ENCOUNTER — Emergency Department: Payer: Self-pay

## 2022-08-21 ENCOUNTER — Emergency Department
Admission: EM | Admit: 2022-08-21 | Discharge: 2022-08-21 | Disposition: A | Discharge status: Home / Self Care | Payer: Self-pay | Attending: Emergency Medicine | Admitting: Emergency Medicine

## 2022-08-21 ENCOUNTER — Other Ambulatory Visit: Payer: Self-pay

## 2022-08-21 DIAGNOSIS — Z20822 Contact with and (suspected) exposure to covid-19: Secondary | ICD-10-CM | POA: Insufficient documentation

## 2022-08-21 DIAGNOSIS — J40 Bronchitis, not specified as acute or chronic: Secondary | ICD-10-CM | POA: Insufficient documentation

## 2022-08-21 DIAGNOSIS — I509 Heart failure, unspecified: Secondary | ICD-10-CM | POA: Insufficient documentation

## 2022-08-21 DIAGNOSIS — J4521 Mild intermittent asthma with (acute) exacerbation: Secondary | ICD-10-CM | POA: Insufficient documentation

## 2022-08-21 LAB — CBC
HCT: 52.8 % — ABNORMAL HIGH (ref 39.0–52.0)
Hemoglobin: 17.1 g/dL — ABNORMAL HIGH (ref 13.0–17.0)
MCH: 27.8 pg (ref 26.0–34.0)
MCHC: 32.4 g/dL (ref 30.0–36.0)
MCV: 85.9 fL (ref 80.0–100.0)
Platelets: 426 10*3/uL — ABNORMAL HIGH (ref 150–400)
RBC: 6.15 MIL/uL — ABNORMAL HIGH (ref 4.22–5.81)
RDW: 13.2 % (ref 11.5–15.5)
WBC: 14.6 10*3/uL — ABNORMAL HIGH (ref 4.0–10.5)
nRBC: 0 % (ref 0.0–0.2)

## 2022-08-21 LAB — MISC LABCORP TEST (SEND OUT): Labcorp test code: 83935

## 2022-08-21 LAB — BASIC METABOLIC PANEL
Anion gap: 13 (ref 5–15)
BUN: 22 mg/dL — ABNORMAL HIGH (ref 6–20)
CO2: 26 mmol/L (ref 22–32)
Calcium: 8.9 mg/dL (ref 8.9–10.3)
Chloride: 100 mmol/L (ref 98–111)
Creatinine, Ser: 0.68 mg/dL (ref 0.61–1.24)
GFR, Estimated: >60 mL/min (ref >60)
Glucose, Bld: 139 mg/dL — ABNORMAL HIGH (ref 70–99)
Potassium: 3.8 mmol/L (ref 3.5–5.1)
Sodium: 139 mmol/L (ref 135–145)

## 2022-08-21 LAB — TROPONIN I (HIGH SENSITIVITY)
Troponin I (High Sensitivity): 10 ng/L (ref <18)
Troponin I (High Sensitivity): 8 ng/L (ref <18)

## 2022-08-21 LAB — RESP PANEL BY RT-PCR (FLU A&B, COVID) ARPGX2
Influenza A by PCR: NEGATIVE
Influenza B by PCR: NEGATIVE
SARS Coronavirus 2 by RT PCR: NEGATIVE

## 2022-08-21 LAB — BRAIN NATRIURETIC PEPTIDE: B Natriuretic Peptide: 7 pg/mL (ref 0.0–100.0)

## 2022-08-21 MED ORDER — ALBUTEROL SULFATE HFA 108 (90 BASE) MCG/ACT IN AERS: 2.0000 | INHALATION_SPRAY | RESPIRATORY_TRACT | 2 refills | Status: DC | PRN

## 2022-08-21 MED ORDER — IOHEXOL 350 MG/ML SOLN
100.0000 mL | Freq: Once | INTRAVENOUS | Status: AC | PRN
  Administered 2022-08-21: 68 mL via INTRAVENOUS

## 2022-08-21 MED ORDER — PREDNISONE 20 MG PO TABS
60.0000 mg | ORAL_TABLET | Freq: Once | ORAL | Status: DC
  Filled 2022-08-21: qty 3

## 2022-08-21 MED ORDER — PREDNISONE 20 MG PO TABS
20.0000 mg | ORAL_TABLET | Freq: Once | ORAL | Status: AC
  Administered 2022-08-21: 20 mg via ORAL

## 2022-08-21 MED ORDER — PREDNISONE 50 MG PO TABS: ORAL_TABLET | ORAL | 0 refills | Status: DC

## 2022-08-21 MED ORDER — SPACER/AERO-HOLD CHAMBER BAGS MISC: 1.0000 [IU] | Freq: Once | 0 refills | Status: AC

## 2022-08-21 MED ORDER — IPRATROPIUM-ALBUTEROL 0.5-2.5 (3) MG/3ML IN SOLN
6.0000 mL | Freq: Once | RESPIRATORY_TRACT | Status: AC
  Administered 2022-08-21: 6 mL via RESPIRATORY_TRACT
  Filled 2022-08-21: qty 6

## 2022-08-21 NOTE — ED Notes (Signed)
Pt prednisone changed from 60mg  to 20mg  verbal aggrement with MD . Pt on 2L dropped to 1L [place] stating at 95%.

## 2022-08-21 NOTE — ED Notes (Signed)
Pt titrated off [place] to see O2 levels. Pt O2 currently stable.

## 2022-08-21 NOTE — ED Notes (Signed)
O2 sats 85-88% on 2l [place].  Increased oxygen to 4l/ [place]

## 2022-08-21 NOTE — ED Notes (Signed)
Called lab. They will run BNP off of previous specimen.

## 2022-08-21 NOTE — Progress Notes (Signed)
   Heart Failure Nurse Navigator Note  Met with patient in the ED.  He states things have been going great after his discharge on Sunday.  He went to work on Monday and Tuesday.  He has an app on his phone where he is tracking his sodium intake along with his fluid intake.  He has been compliant.  He has noted a 20 pound weight loss since being discharged.  [facility] and has been compliant with his medications.  He states last evening was when he started to feel poorly and had a productive cough of yellow sputum with some frothy.  Chest x-ray and CT performed today did not show any heart failure.  Patient had no further questions.  Pricilla Riffle RN [facility]

## 2022-08-21 NOTE — ED Notes (Signed)
Pt to ED recently hospitalized with new CHF dx, taking lasix, states did not sleep last night, feels jittery, cannot get comfortable to sleep, has to sleep prone because feels "like water is just sitting there in my lungs" and has been coughing up "yellowy mucous with foam" since last night. On 4L, placed on in triage. No chronic osxgen use at home.  Feels fatigued since this morning. Has been trying to walk more.

## 2022-08-21 NOTE — Discharge Instructions (Addendum)
Use your inhaler 2 puffs every 4 hours with a spacer as needed for shortness of breath and wheezing. Take prednisone at an increased dose 50 mg for the next 3 days starting tomorrow.  Thank you for choosing [place] for your health care today!  Please see your primary doctor this week for a follow up appointment.   If you do not have a primary doctor call the following clinics to establish care:  If you have insurance:  [facility] [phone] [address] [place] [place] [address]   [facility]  [phone] [address] [place] [place] [address]   If you do not have insurance:  [facility]  [phone] [address], [place] [address]  Sometimes, in the early stages of certain disease courses it is difficult to detect in the emergency department evaluation -- so, it is important that you continue to monitor your symptoms and call your doctor right away or return to the emergency department if you develop any new or worsening symptoms.  It was my pleasure to care for you today.   Daneil Dan Modesto Charon, MD

## 2022-08-21 NOTE — ED Notes (Signed)
Pt to xray

## 2022-08-21 NOTE — ED Notes (Signed)
Pt states still feeling of blockage in back of throat and in chest when coughing. States he has had tonsils and adenoids taken out in the past. Pt also feeling light headed when coughing.

## 2022-08-21 NOTE — ED Provider Notes (Signed)
[facility] Provider Note    Event Date/Time   First MD Initiated Contact with Patient 08/21/22 1125     (approximate)   History   Shortness of Breath   HPI  Christopher Keith is a 36 y.o. male   Past medical history of congestive heart failure, elevated BMI, who presents to the emergency department with shortness of breath, cough, orthopnea and a feeling of "jitteriness" for the past several days.  He has some L chest discomfort.  With his Lasix and other medications, denies a history of asthma or smoking.  He has had a cough with some production of mucus, no fever or chills.  History was obtained via the patient as well as a review of external medical notes including a discharge summary dated 08/18/2022 for which she was hospitalized for hypoxemic respiratory failure, CHF exacerbation, respiratory infection.      Physical Exam   Triage Vital Signs: ED Triage Vitals [08/21/22 1103]  Enc Vitals Group     BP (!) 168/105     Pulse Rate 91     Resp 16     Temp 97.8 F (36.6 C)     Temp Source Oral     SpO2 90 %     Weight (!) 440 lb (199.6 kg)     Height 5\' 7"  (1.702 m)     Head Circumference      Peak Flow      Pain Score 0     Pain Loc      Pain Edu?      Excl. in [place]?     Most recent vital signs: Vitals:   08/21/22 1430 08/21/22 1500  BP: 124/87 (!) 141/83  Pulse: 80 81  Resp: 18   Temp:    SpO2: 91% 93%    General: Awake, no distress.  CV:  Good peripheral perfusion.  No significant peripheral edema. Resp:  Normal effort.  No tachypnea, oxygen saturation is reportedly in the high 80s so placed on 2 L nasal cannula.  He does have some scant wheezing to bilateral apices. Abd:  No distention.  Nontender. Other:  [place] and nontoxic appearing, afebrile, speaking in full sentences.   ED Results / Procedures / Treatments   Labs (all labs ordered are listed, but only abnormal results are displayed) Labs Reviewed  BASIC METABOLIC PANEL  - Abnormal; Notable for the following components:      Result Value   Glucose, Bld 139 (*)    BUN 22 (*)    All other components within normal limits  CBC - Abnormal; Notable for the following components:   WBC 14.6 (*)    RBC 6.15 (*)    Hemoglobin 17.1 (*)    HCT 52.8 (*)    Platelets 426 (*)    All other components within normal limits  RESP PANEL BY RT-PCR (FLU A&B, COVID) ARPGX2  BRAIN NATRIURETIC PEPTIDE  TROPONIN I (HIGH SENSITIVITY)  TROPONIN I (HIGH SENSITIVITY)     I reviewed labs and they are notable for white blood cell count 14.6, to be discharged with prednisone 3 days ago from hospital.  EKG  ED ECG REPORT I, Lucillie Garfinkel, the attending physician, personally viewed and interpreted this ECG.   Date: 08/21/2022  EKG Time: 1114  Rate: 84  Rhythm: normal sinus rhythm  Axis: RAD  Intervals: incomplete RBBB  ST&T Change: no ischemic changes acutely    RADIOLOGY I independently reviewed and interpreted this x-ray and see  no focal opacity or pneumothorax.  Does not appear overtly pulmonary edema.   PROCEDURES:  Critical Care performed: No  Procedures   MEDICATIONS ORDERED IN ED: Medications  ipratropium-albuterol (DUONEB) 0.5-2.5 (3) MG/3ML nebulizer solution 6 mL (6 mLs Nebulization Given 08/21/22 1217)  iohexol (OMNIPAQUE) 350 MG/ML injection 100 mL (68 mLs Intravenous Contrast Given 08/21/22 1237)  predniSONE (DELTASONE) tablet 20 mg (20 mg Oral Given 08/21/22 1336)     IMPRESSION / MDM / ASSESSMENT AND PLAN / ED COURSE  I reviewed the triage vital signs and the nursing notes.                              Differential diagnosis includes, but is not limited to, HF exacerbation, asthma exacerbation, viral upper respiratory infection, bacterial pneumonia, PE, ACS.   MDM: Patient does not appear floridly fluid overloaded on clinical exam nor chest x-ray, he does have some scant wheezing, will give DuoNebs to assess for improvement.  Chest discomfort  with shortness of breath concern for PE, will obtain CT angio.  Patient on minimal oxygen requirement which is new from baseline, Hypoxemic respiratory failure though is maintaining airway and is no respiratory distress.  The patient has remained hemodynamically stable and weaned off of O2 requirement at rest after nebulizer treatments and prednisone.  Fortunately his imaging reveals no pulmonary embolism, bacterial pneumonia, or fluid overload.  I suspect that he has a viral respiratory infection or bronchitis and asthma exacerbation which has responded well to treatment.  He did have some desaturation on his second lap around the emergency department.  He has no hypoxemia at rest on room air and no longer is dyspneic.  I engaged in shared decision making with this patient regarding disposition, offered admission for desaturations with ambulation and ongoing treatment of his asthma exacerbation, but the patient prefers at home treatment and will return to the emergency department if any worsening.  He will follow-up with his PMD.  Patient's presentation is most consistent with acute presentation with potential threat to life or bodily function.       FINAL CLINICAL IMPRESSION(S) / ED DIAGNOSES   Final diagnoses:  Mild intermittent asthma with exacerbation  Bronchitis     Rx / DC Orders   ED Discharge Orders          Ordered    albuterol (VENTOLIN HFA) 108 (90 Base) MCG/ACT inhaler  Every 4 hours PRN       Note to Pharmacy: Please give SPACER   08/21/22 1520    Spacer/Aero-Hold Chamber Bags MISC   Once        08/21/22 1520    predniSONE (DELTASONE) 50 MG tablet        08/21/22 1520             Note:  This document was prepared using Dragon voice recognition software and may include unintentional dictation errors.    Pilar Jarvis, MD 08/21/22 [phone]

## 2022-08-21 NOTE — ED Notes (Signed)
Pt in CT.

## 2022-08-21 NOTE — ED Notes (Addendum)
Pt duoneb given pt tolerating well. Pt relaxing in bed watching television.

## 2022-08-21 NOTE — ED Notes (Signed)
Placed on 2l/ [place] 

## 2022-08-21 NOTE — ED Triage Notes (Signed)
2 recent hospitalizations for CHF.  Last discharged on Sunday. Presents today with cough, orthopnea.  Has appointment scheduled with Cardiology for Friday.  AAOx3.  Skin warm and dry. No SOB/ DOE NAD

## 2022-08-21 NOTE — ED Notes (Signed)
Called lab, they will add on troponin to previous collection.

## 2022-08-22 NOTE — Progress Notes (Signed)
Patient ID: Christopher Keith, male    DOB: Jul 26, 1986, 36 y.o.   MRN: [id number]  HPI  Christopher Keith is a 36 y/o male with a history of HTN, sleep apnea and chronic heart failure.   Echo report from 08/14/22 showed an EF of 65-70% along with mild LVH.   Was in the ED 08/21/22 due to SOB with some wheezing. CT angio negative for PE. Given nebulizer treatment and prednisone with improvement of symptoms and he was released. Admitted 08/15/22 due to shortness of breath, chest discomfort and wheezing. Given IV lasix with transition to oral diuretics. Weaned off oxygen. Discharged after 3 days. Was in the ED 08/13/22 due to SOB. Placed on bipap but unable to tolerate. CXR shows congestion.   He presents today for his initial visit with a chief complaint of minimal fatigue upon moderate exertion. Describes this as chronic in nature and reports that it's improving. He has associated cough, shortness of breath, occasional palpitations, difficulty sleeping (due to cough) and chronic back pain along with this. He denies any abdominal distention, pedal edema, chest pain, wheezing, dizziness or weight gain.   Says that he hasn't vaped since the end of October.   Reports having sleep apnea but has never worn CPAP.   Tracking all his food/ fluid intake on an app called cronometer.   Past Medical History:  Diagnosis Date   CHF (congestive heart failure) (HCC)    Hypertension    MRSA carrier    Obesity    Sleep apnea     Past Surgical History:  Procedure Laterality Date   ADENOIDECTOMY     IRRIGATION AND DEBRIDEMENT ABSCESS Right 02/23/2019   Procedure: IRRIGATION AND DEBRIDEMENT PERI RECTAL ABSCESS;  Surgeon: Emelia Loron, MD;  Location: [facility] OR;  Service: General;  Laterality: Right;   TONSILLECTOMY     Family History  Problem Relation Age of Onset   Gastric cancer Mother    Alcohol abuse Father    Obesity Father    Diabetes Mellitus II Brother    Social History   Tobacco Use   Smoking status:  Former    Types: Cigarettes    Quit date: 08/06/2022    Years since quitting: 0.0   Smokeless tobacco: Former  Substance Use Topics   Alcohol use: Yes   No Known Allergies Prior to Admission medications   Medication Sig Start Date End Date Taking? Authorizing Provider  albuterol (VENTOLIN HFA) 108 (90 Base) MCG/ACT inhaler Inhale 2 puffs into the lungs every 4 (four) hours as needed for wheezing or shortness of breath. 08/21/22  Yes Pilar Jarvis, MD  amLODipine (NORVASC) 10 MG tablet Take 1 tablet (10 mg total) by mouth daily. 08/19/22  Yes Wouk, Wilfred Curtis, MD  ASHWAGANDHA PO Take 300 mg by mouth daily. Gummies   Yes [provider]  dextromethorphan-guaiFENesin (MUCINEX DM) 30-600 MG 12hr tablet Take 1 tablet by mouth daily.   Yes [provider]  doxycycline (VIBRAMYCIN) 100 MG capsule Take 1 capsule (100 mg total) by mouth 2 (two) times daily. 08/18/22  Yes Wouk, Wilfred Curtis, MD  furosemide (LASIX) 40 MG tablet Take 1 tablet (40 mg total) by mouth daily. 08/18/22 08/18/23 Yes Wouk, Wilfred Curtis, MD  lisinopril (ZESTRIL) 20 MG tablet Take 1 tablet (20 mg total) by mouth daily. 08/15/22  Yes Wouk, Wilfred Curtis, MD  Misc Natural Products (ELDERBERRY/VITAMIN C/ZINC) CHEW Chew 2 Pieces by mouth daily. Gummies   Yes [provider]  predniSONE (DELTASONE) 50 MG  tablet Take one tablet daily starting THURSDAY 08/22/2022 for three days 08/21/22  Yes Pilar Jarvis, MD  sodium chloride (OCEAN) 0.65 % SOLN nasal spray Place 1 spray into both nostrils as needed for congestion.   Yes [provider]  Spacer/Aero-Hold Chamber Bags MISC 1 Units by Does not apply route once for 1 dose. 08/21/22 08/21/22  Pilar Jarvis, MD   Review of Systems  Constitutional:  Positive for fatigue (improving). Negative for appetite change.  HENT:  Negative for congestion, postnasal drip and sore throat.   Eyes: Negative.   Respiratory:  Positive for cough and shortness of breath.  Negative for chest tightness and wheezing.   Cardiovascular:  Positive for palpitations (on occasion). Negative for chest pain and leg swelling.  Gastrointestinal:  Negative for abdominal distention and abdominal pain.  Endocrine: Negative.   Genitourinary: Negative.   Musculoskeletal:  Positive for back pain (chronic back pain). Negative for neck pain.  Skin: Negative.   Allergic/Immunologic: Negative.   Neurological:  Negative for dizziness and light-headedness.  Hematological:  Negative for adenopathy. Does not bruise/bleed easily.  Psychiatric/Behavioral:  Positive for sleep disturbance (trouble sleeping due to cough). Negative for dysphoric mood. The patient is not nervous/anxious.     Vitals:   08/23/22 1008  BP: (!) 151/95  Pulse: 86  Resp: 18  SpO2: 96%  Weight: (!) 447 lb (202.8 kg)   Wt Readings from Last 3 Encounters:  08/23/22 (!) 447 lb (202.8 kg)  08/21/22 (!) 440 lb (199.6 kg)  08/15/22 (!) 464 lb 1.1 oz (210.5 kg)   Lab Results  Component Value Date   CREATININE 0.68 08/21/2022   CREATININE 0.54 (L) 08/18/2022   CREATININE 0.61 08/17/2022   Physical Exam Vitals and nursing note reviewed. Exam conducted with a chaperone present (son).  Constitutional:      Appearance: Normal appearance. He is obese.  HENT:     Head: Normocephalic and atraumatic.  Cardiovascular:     Rate and Rhythm: Normal rate and regular rhythm.  Pulmonary:     Effort: Pulmonary effort is normal. No respiratory distress.     Breath sounds: Wheezing (expiratory wheezing throughout all lung fields) present.  Abdominal:     General: There is no distension.     Palpations: Abdomen is soft.     Tenderness: There is no abdominal tenderness.  Musculoskeletal:        General: No tenderness.     Cervical back: Normal range of motion and neck supple.     Right lower leg: No edema.     Left lower leg: Edema (trace pitting) present.  Skin:    General: Skin is warm and dry.  Neurological:      General: No focal deficit present.     Mental Status: He is alert and oriented to person, place, and time.  Psychiatric:        Mood and Affect: Mood normal.        Behavior: Behavior normal.        Thought Content: Thought content normal.    Assessment & Plan:  1: Chronic heart failure with preserved ejection fraction with structural changes (LVH)- - NYHA class II - euvolemic - weighing daily; reminded to call for an overnight weight gain of > 2 pounds or a weekly weight gain of > 5 pounds - not adding salt and diligently tracking sodium intake to keep it 2000mg  - keeping daily fluid intake to <2L - has a sedentary job and is mentally tired  once he gets home - currently on lisinopril - will add farxiga 10mg  daily; 30 day voucher provided - check BMP next visit and consider changing his lisinopril to entrest at next visit - BNP 08/21/22 was 7.0 - stopped vaping the end of October  2: HTN- - BP elevated (151/95) - says that he's working on getting a PCP - BMP 08/21/22 reviewed and showed sodium 139, potassium 3.8, creatinine 0.68 and GFR >60  3: Sleep apnea- - to see pulmonology (Dgayli) 09/11/22 - says that he's never worn CPAP   Medication list reviewed.   Return in 1 month, sooner if needed.

## 2022-08-23 ENCOUNTER — Ambulatory Visit: Payer: Medicaid Other | Attending: Family | Admitting: Family

## 2022-08-23 ENCOUNTER — Encounter: Payer: Self-pay | Admitting: Family

## 2022-08-23 VITALS — BP 151/95 | HR 86 | Resp 18 | Wt >= 6400 oz

## 2022-08-23 DIAGNOSIS — R5383 Other fatigue: Secondary | ICD-10-CM | POA: Insufficient documentation

## 2022-08-23 DIAGNOSIS — Z22322 Carrier or suspected carrier of Methicillin resistant Staphylococcus aureus: Secondary | ICD-10-CM | POA: Insufficient documentation

## 2022-08-23 DIAGNOSIS — E669 Obesity, unspecified: Secondary | ICD-10-CM | POA: Insufficient documentation

## 2022-08-23 DIAGNOSIS — G4733 Obstructive sleep apnea (adult) (pediatric): Secondary | ICD-10-CM

## 2022-08-23 DIAGNOSIS — I5032 Chronic diastolic (congestive) heart failure: Secondary | ICD-10-CM

## 2022-08-23 DIAGNOSIS — M549 Dorsalgia, unspecified: Secondary | ICD-10-CM | POA: Insufficient documentation

## 2022-08-23 DIAGNOSIS — R062 Wheezing: Secondary | ICD-10-CM | POA: Insufficient documentation

## 2022-08-23 DIAGNOSIS — Z6841 Body Mass Index (BMI) 40.0 and over, adult: Secondary | ICD-10-CM | POA: Insufficient documentation

## 2022-08-23 DIAGNOSIS — G8929 Other chronic pain: Secondary | ICD-10-CM | POA: Insufficient documentation

## 2022-08-23 DIAGNOSIS — I10 Essential (primary) hypertension: Secondary | ICD-10-CM

## 2022-08-23 DIAGNOSIS — Z79899 Other long term (current) drug therapy: Secondary | ICD-10-CM | POA: Insufficient documentation

## 2022-08-23 DIAGNOSIS — Z7951 Long term (current) use of inhaled steroids: Secondary | ICD-10-CM | POA: Insufficient documentation

## 2022-08-23 DIAGNOSIS — R609 Edema, unspecified: Secondary | ICD-10-CM | POA: Insufficient documentation

## 2022-08-23 DIAGNOSIS — I110 Hypertensive heart disease with heart failure: Secondary | ICD-10-CM | POA: Insufficient documentation

## 2022-08-23 DIAGNOSIS — G4730 Sleep apnea, unspecified: Secondary | ICD-10-CM | POA: Insufficient documentation

## 2022-08-23 DIAGNOSIS — R058 Other specified cough: Secondary | ICD-10-CM | POA: Insufficient documentation

## 2022-08-23 DIAGNOSIS — R0602 Shortness of breath: Secondary | ICD-10-CM | POA: Insufficient documentation

## 2022-08-23 DIAGNOSIS — R002 Palpitations: Secondary | ICD-10-CM | POA: Insufficient documentation

## 2022-08-23 DIAGNOSIS — R0789 Other chest pain: Secondary | ICD-10-CM | POA: Insufficient documentation

## 2022-08-23 DIAGNOSIS — Z7984 Long term (current) use of oral hypoglycemic drugs: Secondary | ICD-10-CM | POA: Insufficient documentation

## 2022-08-23 MED ORDER — DAPAGLIFLOZIN PROPANEDIOL 10 MG PO TABS: 10.0000 mg | ORAL_TABLET | Freq: Every day | ORAL | 5 refills | Status: DC

## 2022-08-23 NOTE — Patient Instructions (Addendum)
Continue weighing daily and call for an overnight weight gain of 3 pounds or more or a weekly weight gain of more than 5 pounds.   If you have voicemail, please make sure your mailbox is cleaned out so that we may leave a message and please make sure to listen to any voicemails.    Begin taking farxiga as 1 tablet every day.  

## 2022-09-11 ENCOUNTER — Other Ambulatory Visit: Payer: Self-pay | Admitting: Family

## 2022-09-11 ENCOUNTER — Institutional Professional Consult (permissible substitution): Payer: Medicaid Other | Admitting: Student in an Organized Health Care Education/Training Program

## 2022-09-11 MED ORDER — DAPAGLIFLOZIN PROPANEDIOL 10 MG PO TABS: 10.0000 mg | ORAL_TABLET | Freq: Every day | ORAL | 3 refills | Status: DC

## 2022-09-11 MED ORDER — FUROSEMIDE 40 MG PO TABS: 40.0000 mg | ORAL_TABLET | Freq: Every day | ORAL | 5 refills | Status: DC

## 2022-09-11 MED ORDER — AMLODIPINE BESYLATE 10 MG PO TABS: 10.0000 mg | ORAL_TABLET | Freq: Every day | ORAL | 5 refills | Status: DC

## 2022-09-11 MED ORDER — LISINOPRIL 20 MG PO TABS: 20.0000 mg | ORAL_TABLET | Freq: Every day | ORAL | 5 refills | Status: DC

## 2022-09-16 ENCOUNTER — Ambulatory Visit: Payer: Medicaid Other | Admitting: Family

## 2022-09-19 ENCOUNTER — Institutional Professional Consult (permissible substitution): Payer: Medicaid Other | Admitting: Student in an Organized Health Care Education/Training Program

## 2022-09-22 NOTE — Progress Notes (Deleted)
Patient ID: Christopher Keith, male    DOB: Jul 22, 1986, 36 y.o.   MRN: [id number]  HPI  Christopher Keith is a 36 y/o male with a history of HTN, sleep apnea and chronic heart failure.   Echo report from 08/14/22 showed an EF of 65-70% along with mild LVH.   Was in the ED 08/21/22 due to SOB with some wheezing. CT angio negative for PE. Given nebulizer treatment and prednisone with improvement of symptoms and he was released. Admitted 08/15/22 due to shortness of breath, chest discomfort and wheezing. Given IV lasix with transition to oral diuretics. Weaned off oxygen. Discharged after 3 days. Was in the ED 08/13/22 due to SOB. Placed on bipap but unable to tolerate. CXR shows congestion.   He presents today for a follow-up visit with a chief complaint of   Says that he hasn't vaped since the end of October.   Reports having sleep apnea but has never worn CPAP.   Tracking all his food/ fluid intake on an app called cronometer.   Past Medical History:  Diagnosis Date   CHF (congestive heart failure) (HCC)    Hypertension    MRSA carrier    Obesity    Sleep apnea     Past Surgical History:  Procedure Laterality Date   ADENOIDECTOMY     IRRIGATION AND DEBRIDEMENT ABSCESS Right 02/23/2019   Procedure: IRRIGATION AND DEBRIDEMENT PERI RECTAL ABSCESS;  Surgeon: Emelia Loron, MD;  Location: [facility] OR;  Service: General;  Laterality: Right;   TONSILLECTOMY     Family History  Problem Relation Age of Onset   Gastric cancer Mother    Alcohol abuse Father    Obesity Father    Diabetes Mellitus II Brother    Social History   Tobacco Use   Smoking status: Former    Types: Cigarettes    Quit date: 08/06/2022    Years since quitting: 0.1   Smokeless tobacco: Former  Substance Use Topics   Alcohol use: Yes   No Known Allergies  Review of Systems  Constitutional:  Positive for fatigue (improving). Negative for appetite change.  HENT:  Negative for congestion, postnasal drip and sore throat.    Eyes: Negative.   Respiratory:  Positive for cough and shortness of breath. Negative for chest tightness and wheezing.   Cardiovascular:  Positive for palpitations (on occasion). Negative for chest pain and leg swelling.  Gastrointestinal:  Negative for abdominal distention and abdominal pain.  Endocrine: Negative.   Genitourinary: Negative.   Musculoskeletal:  Positive for back pain (chronic back pain). Negative for neck pain.  Skin: Negative.   Allergic/Immunologic: Negative.   Neurological:  Negative for dizziness and light-headedness.  Hematological:  Negative for adenopathy. Does not bruise/bleed easily.  Psychiatric/Behavioral:  Positive for sleep disturbance (trouble sleeping due to cough). Negative for dysphoric mood. The patient is not nervous/anxious.       Physical Exam Vitals and nursing note reviewed. Exam conducted with a chaperone present (son).  Constitutional:      Appearance: Normal appearance. He is obese.  HENT:     Head: Normocephalic and atraumatic.  Cardiovascular:     Rate and Rhythm: Normal rate and regular rhythm.  Pulmonary:     Effort: Pulmonary effort is normal. No respiratory distress.     Breath sounds: Wheezing (expiratory wheezing throughout all lung fields) present.  Abdominal:     General: There is no distension.     Palpations: Abdomen is soft.     Tenderness:  There is no abdominal tenderness.  Musculoskeletal:        General: No tenderness.     Cervical back: Normal range of motion and neck supple.     Right lower leg: No edema.     Left lower leg: Edema (trace pitting) present.  Skin:    General: Skin is warm and dry.  Neurological:     General: No focal deficit present.     Mental Status: He is alert and oriented to person, place, and time.  Psychiatric:        Mood and Affect: Mood normal.        Behavior: Behavior normal.        Thought Content: Thought content normal.    Assessment & Plan:  1: Chronic heart failure with  preserved ejection fraction with structural changes (LVH)- - NYHA class II - euvolemic - weighing daily; reminded to call for an overnight weight gain of > 2 pounds or a weekly weight gain of > 5 pounds - not adding salt and diligently tracking sodium intake to keep it 2000mg  - keeping daily fluid intake to <2L - has a sedentary job and is mentally tired once he gets home - currently on lisinopril & farxiga - check BMP today as farxiga started at last visit - BNP 08/21/22 was 7.0 - stopped vaping the end of October  2: HTN- - BP  - says that he's working on getting a PCP - BMP 08/21/22 reviewed and showed sodium 139, potassium 3.8, creatinine 0.68 and GFR >60  3: Sleep apnea- - to see pulmonology (Dgayli) 09/11/22 - says that he's never worn CPAP   Medication list reviewed.

## 2022-09-23 ENCOUNTER — Telehealth: Payer: Self-pay | Admitting: Family

## 2022-09-23 ENCOUNTER — Ambulatory Visit: Payer: Medicaid Other | Admitting: Family

## 2022-09-23 ENCOUNTER — Other Ambulatory Visit (HOSPITAL_COMMUNITY): Payer: Self-pay

## 2022-09-23 NOTE — Telephone Encounter (Signed)
Patient did not show for his Heart Failure Clinic appointment on 09/23/22. Will attempt to reschedule.   

## 2022-10-09 ENCOUNTER — Emergency Department: Payer: Medicaid Other

## 2022-10-09 ENCOUNTER — Other Ambulatory Visit: Payer: Self-pay

## 2022-10-09 ENCOUNTER — Emergency Department
Admission: EM | Admit: 2022-10-09 | Discharge: 2022-10-09 | Disposition: A | Discharge status: Home / Self Care | Payer: Medicaid Other | Attending: Emergency Medicine | Admitting: Emergency Medicine

## 2022-10-09 DIAGNOSIS — Z20822 Contact with and (suspected) exposure to covid-19: Secondary | ICD-10-CM | POA: Insufficient documentation

## 2022-10-09 DIAGNOSIS — I110 Hypertensive heart disease with heart failure: Secondary | ICD-10-CM | POA: Insufficient documentation

## 2022-10-09 DIAGNOSIS — I509 Heart failure, unspecified: Secondary | ICD-10-CM | POA: Insufficient documentation

## 2022-10-09 DIAGNOSIS — R0602 Shortness of breath: Secondary | ICD-10-CM

## 2022-10-09 DIAGNOSIS — E876 Hypokalemia: Secondary | ICD-10-CM | POA: Insufficient documentation

## 2022-10-09 DIAGNOSIS — J45901 Unspecified asthma with (acute) exacerbation: Secondary | ICD-10-CM | POA: Insufficient documentation

## 2022-10-09 LAB — CBC
HCT: 42.4 % (ref 39.0–52.0)
Hemoglobin: 13.5 g/dL (ref 13.0–17.0)
MCH: 27.8 pg (ref 26.0–34.0)
MCHC: 31.8 g/dL (ref 30.0–36.0)
MCV: 87.4 fL (ref 80.0–100.0)
Platelets: 295 10*3/uL (ref 150–400)
RBC: 4.85 MIL/uL (ref 4.22–5.81)
RDW: 13.6 % (ref 11.5–15.5)
WBC: 8.5 10*3/uL (ref 4.0–10.5)
nRBC: 0 % (ref 0.0–0.2)

## 2022-10-09 LAB — TROPONIN I (HIGH SENSITIVITY): Troponin I (High Sensitivity): 7 ng/L (ref <18)

## 2022-10-09 LAB — RESP PANEL BY RT-PCR (RSV, FLU A&B, COVID)  RVPGX2
Influenza A by PCR: NEGATIVE
Influenza B by PCR: NEGATIVE
Resp Syncytial Virus by PCR: NEGATIVE
SARS Coronavirus 2 by RT PCR: NEGATIVE

## 2022-10-09 LAB — BASIC METABOLIC PANEL
Anion gap: 9 (ref 5–15)
BUN: 11 mg/dL (ref 6–20)
CO2: 26 mmol/L (ref 22–32)
Calcium: 8.6 mg/dL — ABNORMAL LOW (ref 8.9–10.3)
Chloride: 103 mmol/L (ref 98–111)
Creatinine, Ser: 0.59 mg/dL — ABNORMAL LOW (ref 0.61–1.24)
GFR, Estimated: >60 mL/min (ref >60)
Glucose, Bld: 137 mg/dL — ABNORMAL HIGH (ref 70–99)
Potassium: 3.4 mmol/L — ABNORMAL LOW (ref 3.5–5.1)
Sodium: 138 mmol/L (ref 135–145)

## 2022-10-09 MED ORDER — PREDNISONE 20 MG PO TABS: 60.0000 mg | ORAL_TABLET | Freq: Every day | ORAL | 0 refills | Status: AC

## 2022-10-09 MED ORDER — FUROSEMIDE 40 MG PO TABS
40.0000 mg | ORAL_TABLET | Freq: Once | ORAL | Status: AC
  Administered 2022-10-09: 40 mg via ORAL
  Filled 2022-10-09: qty 1

## 2022-10-09 MED ORDER — PREDNISONE 20 MG PO TABS
60.0000 mg | ORAL_TABLET | Freq: Once | ORAL | Status: AC
  Administered 2022-10-09: 60 mg via ORAL
  Filled 2022-10-09: qty 3

## 2022-10-09 MED ORDER — ALBUTEROL SULFATE HFA 108 (90 BASE) MCG/ACT IN AERS: 2.0000 | INHALATION_SPRAY | Freq: Four times a day (QID) | RESPIRATORY_TRACT | 0 refills | Status: AC | PRN

## 2022-10-09 MED ORDER — POTASSIUM CHLORIDE CRYS ER 20 MEQ PO TBCR
40.0000 meq | EXTENDED_RELEASE_TABLET | Freq: Once | ORAL | Status: AC
  Administered 2022-10-09: 40 meq via ORAL
  Filled 2022-10-09: qty 2

## 2022-10-09 MED ORDER — IPRATROPIUM-ALBUTEROL 0.5-2.5 (3) MG/3ML IN SOLN
3.0000 mL | Freq: Once | RESPIRATORY_TRACT | Status: AC
  Administered 2022-10-09: 3 mL via RESPIRATORY_TRACT
  Filled 2022-10-09: qty 3

## 2022-10-09 NOTE — Consult Note (Addendum)
   Heart Failure Nurse Navigator Note  HFpEF 65 to 70%.  Mild LVH.  Unable to evaluate diastolic dysfunction.  Presented to the emergency room with complaints of worsening shortness of breath, states that this has been off and on since the early morning of Monday morning.  Chest x-ray with vascular congestion but no overt pulmonary edema.   Comorbidities:  Tobacco abuse Hypertension Morbid obesity Asthma Obstructive sleep apnea  Medications:  Lasix 40 mg p.o. daily Potassium chloride 40 mEq daily Amlodipine 10 mg daily Zestril 20 mg daily  Labs:  Sodium 138, potassium 3.4, chloride 103, CO2 26, BUN 11, creatinine 0.59, estimated GFR greater than 60, hemoglobin 17.1 hematocrit 52.8, troponin 7.  Initial meeting with patient who was in the triage area in the emergency room.  He is  accompanied by his son.  He states that he had been in the process of moving this last week and due to having very long days and no real kitchen to prepare meals, they were eating fast food.  He did not think that eating the fast food would affect him like it did.  He is also not been weighing himself for the past week.  Also discussed why he did not show up to his heart failure clinic appointment on December 18 and he states that he has been busy with planning the move and the holidays that by time he realized he had missed the appointment it was too late.  He also voiced that he has been told before he can be seen by the pulmonology doctor that he has to pay up front  $150.  He is still waiting for his Medicaid insurance.  He states the hold-up is with his son who originally was living in [place] with his mother and now is down here living with the patient.  Explained that we would not turn him away from the clinic due to payments owed.  Also discussed if in the future he has symptoms like this he would call and speak to [facility] rather than ending up in the emergency room.  He voices  understanding.  Questions he states that he has been compliant with his fluids, he is still continuing to track his sodium on the app and has been compliant with his medications.  He was told and given written instructions to follow-up with the outpatient heart failure clinic on January 5 at 12:00.  He voices understanding.  Had no further questions.  Pricilla Riffle RN [facility]

## 2022-10-09 NOTE — ED Provider Notes (Signed)
[facility] Provider Note    Event Date/Time   First MD Initiated Contact with Patient 10/09/22 [phone]     (approximate)   History   Chief Complaint Shortness of Breath  HPI  Christopher Keith is a 37 y.o. male with past medical history of hypertension, CHF, OSA, and asthma who presents to the ED complaining of shortness of breath.  Patient reports that he has been having intermittent episodes of difficulty breathing over the past 2 days.  He states this typically occurs with exertion and has been associated with a dry cough.  He will use his inhaler with partial relief, but states he is not able to walk as far as he is typically used to.  He has noticed increasing swelling in his legs as well as some weight gain.  He reports being compliant with his Lasix but has had increased salt intake over the past couple of days due to moving and not having access to a kitchen, has been eating fast food.  He denies any nausea, vomiting, diarrhea, fevers, or pain in his chest.     Physical Exam   Triage Vital Signs: ED Triage Vitals  Enc Vitals Group     BP 10/09/22 0808 (!) 163/86     Pulse Rate 10/09/22 0808 71     Resp 10/09/22 0808 20     Temp 10/09/22 0808 97.9 F (36.6 C)     Temp src --      SpO2 10/09/22 0808 97 %     Weight 10/09/22 0832 (!) 474 lb 3.4 oz (215.1 kg)     Height --      Head Circumference --      Peak Flow --      Pain Score 10/09/22 0806 0     Pain Loc --      Pain Edu? --      Excl. in [place]? --     Most recent vital signs: Vitals:   10/09/22 0808  BP: (!) 163/86  Pulse: 71  Resp: 20  Temp: 97.9 F (36.6 C)  SpO2: 97%    Constitutional: Alert and oriented. Eyes: Conjunctivae are normal. Head: Atraumatic. Nose: No congestion/rhinnorhea. Mouth/Throat: Mucous membranes are moist.  Cardiovascular: Normal rate, regular rhythm. Grossly normal heart sounds.  2+ radial pulses bilaterally. Respiratory: Normal respiratory effort.  No  retractions. Lungs with expiratory wheezing throughout. Gastrointestinal: Soft and nontender. No distention. Musculoskeletal: No lower extremity tenderness, 2+ pitting edema to knees bilaterally. Neurologic:  Normal speech and language. No gross focal neurologic deficits are appreciated.    ED Results / Procedures / Treatments   Labs (all labs ordered are listed, but only abnormal results are displayed) Labs Reviewed  BASIC METABOLIC PANEL - Abnormal; Notable for the following components:      Result Value   Potassium 3.4 (*)    Glucose, Bld 137 (*)    Creatinine, Ser 0.59 (*)    Calcium 8.6 (*)    All other components within normal limits  RESP PANEL BY RT-PCR (RSV, FLU A&B, COVID)  RVPGX2  CBC  TROPONIN I (HIGH SENSITIVITY)     EKG  ED ECG REPORT I, Blake Divine, the attending physician, personally viewed and interpreted this ECG.   Date: 10/09/2022  EKG Time: 8:16  Rate: 77  Rhythm: normal sinus rhythm  Axis: Normal  Intervals:right bundle branch block  ST&T Change: None  RADIOLOGY Chest x-ray reviewed and interpreted by me with no infiltrate, edema, or  effusion.  PROCEDURES:  Critical Care performed: No  Procedures   MEDICATIONS ORDERED IN ED: Medications  ipratropium-albuterol (DUONEB) 0.5-2.5 (3) MG/3ML nebulizer solution 3 mL (has no administration in time range)  ipratropium-albuterol (DUONEB) 0.5-2.5 (3) MG/3ML nebulizer solution 3 mL (3 mLs Nebulization Given 10/09/22 1010)  predniSONE (DELTASONE) tablet 60 mg (60 mg Oral Given 10/09/22 1010)  furosemide (LASIX) tablet 40 mg (40 mg Oral Given 10/09/22 1010)  potassium chloride SA (KLOR-CON M) CR tablet 40 mEq (40 mEq Oral Given 10/09/22 1025)     IMPRESSION / MDM / ASSESSMENT AND PLAN / ED COURSE  I reviewed the triage vital signs and the nursing notes.                              37 y.o. male with past medical history of hypertension, CHF, OSA, and asthma who presents to the ED with increasing  difficulty breathing over the past 2 days associated with dry cough and worse with exertion.  Patient's presentation is most consistent with acute presentation with potential threat to life or bodily function.  Differential diagnosis includes, but is not limited to, CHF exacerbation, pneumonia, asthma exacerbation, OHS, ACS, PE.  Patient nontoxic-appearing and in no acute distress, vital signs are unremarkable and he is not in any respiratory distress at rest.  He is maintaining oxygen saturations at 97% on room air, does have expiratory wheezing throughout.  He also appears clinically fluid overloaded, although chest x-ray does not show any pulmonary edema or other acute finding.  Suspect combination of mild CHF exacerbation as well as mild asthma exacerbation.  We will give extra dose of his Lasix, also treat with prednisone and DuoNeb.  EKG shows no evidence of arrhythmia or ischemia and troponin is negative, low suspicion for ACS or PE given reassuring vital signs.  Labs show mild hypokalemia which we will replete, no significant AKI, anemia, or leukocytosis.  Patient reports feeling better following breathing treatment and dose of Lasix, wheezing much improved on reevaluation but will give follow-up breathing treatment for ongoing wheezing.  He was able to ambulate to and from the bathroom without difficulty and is appropriate for discharge home, will be provided with referral back to the CHF clinic.  He was also counseled to establish care with PCP for follow-up related to asthma exacerbation and recheck of blood pressure.  He was counseled to return to the ED for new or worsening symptoms, patient agrees with plan.      FINAL CLINICAL IMPRESSION(S) / ED DIAGNOSES   Final diagnoses:  Exacerbation of asthma, unspecified asthma severity, unspecified whether persistent  Shortness of breath     Rx / DC Orders   ED Discharge Orders          Ordered    AMB referral to CHF clinic         10/09/22 1223    predniSONE (DELTASONE) 20 MG tablet  Daily with breakfast        10/09/22 1225    albuterol (VENTOLIN HFA) 108 (90 Base) MCG/ACT inhaler  Every 6 hours PRN       Note to Pharmacy: Please supply with spacer   10/09/22 1225             Note:  This document was prepared using Dragon voice recognition software and may include unintentional dictation errors.   Blake Divine, MD 10/09/22 1226

## 2022-10-09 NOTE — ED Triage Notes (Signed)
C/O shortness of breath since Monday night. Hx of CHF.  States was moving over the weekend. Denies fever and chills.  States BP elevated as well... 180/95.

## 2022-10-09 NOTE — ED Provider Triage Note (Signed)
Emergency Medicine Provider Triage Evaluation Note  Christopher Keith , a 37 y.o. male  was evaluated in triage.  Pt complains of shortness of breath. History of CHF  Review of Systems  Positive: SOB, wheezing Negative:   Physical Exam  BP (!) 163/86 (BP Location: Left Arm)   Pulse 71   Temp 97.9 F (36.6 C)   Resp 20   SpO2 97%  Gen:   Awake, no distress   Resp:  Normal effort.  Wheezing bilaterally MSK:   Edema lower extremities.   Other:    Medical Decision Making  Medically screening exam initiated at 8:12 AM.  Appropriate orders placed.  Christopher Keith was informed that the remainder of the evaluation will be completed by another provider, this initial triage assessment does not replace that evaluation, and the importance of remaining in the ED until their evaluation is complete.     Johnn Hai, PA-C 10/09/22 [phone]

## 2022-10-11 ENCOUNTER — Encounter: Payer: Self-pay | Admitting: Family

## 2022-10-11 ENCOUNTER — Ambulatory Visit: Payer: Medicaid Other | Attending: Family | Admitting: Family

## 2022-10-11 VITALS — BP 159/86 | HR 77 | Resp 18 | Wt >= 6400 oz

## 2022-10-11 DIAGNOSIS — J45909 Unspecified asthma, uncomplicated: Secondary | ICD-10-CM

## 2022-10-11 DIAGNOSIS — R0989 Other specified symptoms and signs involving the circulatory and respiratory systems: Secondary | ICD-10-CM | POA: Insufficient documentation

## 2022-10-11 DIAGNOSIS — G4730 Sleep apnea, unspecified: Secondary | ICD-10-CM | POA: Insufficient documentation

## 2022-10-11 DIAGNOSIS — R5383 Other fatigue: Secondary | ICD-10-CM | POA: Insufficient documentation

## 2022-10-11 DIAGNOSIS — I110 Hypertensive heart disease with heart failure: Secondary | ICD-10-CM | POA: Insufficient documentation

## 2022-10-11 DIAGNOSIS — Z87891 Personal history of nicotine dependence: Secondary | ICD-10-CM | POA: Insufficient documentation

## 2022-10-11 DIAGNOSIS — G4733 Obstructive sleep apnea (adult) (pediatric): Secondary | ICD-10-CM

## 2022-10-11 DIAGNOSIS — R0789 Other chest pain: Secondary | ICD-10-CM | POA: Insufficient documentation

## 2022-10-11 DIAGNOSIS — M549 Dorsalgia, unspecified: Secondary | ICD-10-CM | POA: Insufficient documentation

## 2022-10-11 DIAGNOSIS — I10 Essential (primary) hypertension: Secondary | ICD-10-CM

## 2022-10-11 DIAGNOSIS — I5032 Chronic diastolic (congestive) heart failure: Secondary | ICD-10-CM

## 2022-10-11 MED ORDER — DAPAGLIFLOZIN PROPANEDIOL 10 MG PO TABS: 10.0000 mg | ORAL_TABLET | Freq: Every day | ORAL | 3 refills | Status: DC

## 2022-10-11 NOTE — Progress Notes (Unsigned)
Patient ID: Darrin Koman, male    DOB: [birth year], 37 y.o.   MRN: [id number]  HPI  Mr Leggette is a 37 y/o male with a history of HTN, sleep apnea and chronic heart failure.   Echo report from 08/14/22 showed an EF of 65-70% along with mild LVH.   Was in the ED 10/09/22 due to a/c heart failure. Was in the ED 08/21/22 due to SOB with some wheezing. CT angio negative for PE. Given nebulizer treatment and prednisone with improvement of symptoms and he was released. Admitted 08/15/22 due to shortness of breath, chest discomfort and wheezing. Given IV lasix with transition to oral diuretics. Weaned off oxygen. Discharged after 3 days. Was in the ED 08/13/22 due to SOB. Placed on bipap but unable to tolerate. CXR shows congestion.   He presents today for a follow-up visit with a chief complaint of minimal fatigue upon moderate exertion. Describes this as chronic in nature. Has associated head congestion, cough, shortness of breath, palpitations, back pain, difficulty sleeping and weight gain along with this.   Says that he hasn't vaped since the end of October.   Reports having sleep apnea but has never worn CPAP.   Was tracking all his food/ fluid intake on an app called cronometer but then he moved so was eating out often. He says that for 4 days, he ate out at cookout during this time which resulted in his ED visit. Is now moved in so is back eating at home and tracking his food/ fluid intake.   Pharmacy has been unable to get his amlodipine and he has been out of farxiga due to cost. Medicaid is pending.   Past Medical History:  Diagnosis Date   CHF (congestive heart failure) (HCC)    Hypertension    MRSA carrier    Obesity    Sleep apnea     Past Surgical History:  Procedure Laterality Date   ADENOIDECTOMY     IRRIGATION AND DEBRIDEMENT ABSCESS Right 02/23/2019   Procedure: IRRIGATION AND DEBRIDEMENT PERI RECTAL ABSCESS;  Surgeon: Rolm Bookbinder, MD;  Location: [facility];  Service: General;   Laterality: Right;   TONSILLECTOMY     Family History  Problem Relation Age of Onset   Gastric cancer Mother    Alcohol abuse Father    Obesity Father    Diabetes Mellitus II Brother    Social History   Tobacco Use   Smoking status: Former    Types: Cigarettes    Quit date: 08/06/2022    Years since quitting: 0.1   Smokeless tobacco: Former  Substance Use Topics   Alcohol use: Yes   No Known Allergies  Prior to Admission medications   Medication Sig Start Date End Date Taking? Authorizing Provider  albuterol (VENTOLIN HFA) 108 (90 Base) MCG/ACT inhaler Inhale 2 puffs into the lungs every 4 (four) hours as needed for wheezing or shortness of breath. 08/21/22  Yes Lucillie Garfinkel, MD  albuterol (VENTOLIN HFA) 108 (90 Base) MCG/ACT inhaler Inhale 2 puffs into the lungs every 6 (six) hours as needed for wheezing or shortness of breath. 10/09/22  Yes Blake Divine, MD  ASHWAGANDHA PO Take 300 mg by mouth daily. Gummies   Yes [provider]  furosemide (LASIX) 40 MG tablet Take 1 tablet (40 mg total) by mouth daily. 09/11/22 09/11/23 Yes Jetty Berland, Otila Kluver A, FNP  lisinopril (ZESTRIL) 20 MG tablet Take 1 tablet (20 mg total) by mouth daily. 09/11/22  Yes Alisa Graff, FNP  Misc Natural Products (ELDERBERRY/VITAMIN C/ZINC) CHEW Chew 2 Pieces by mouth daily. Gummies   Yes [provider]  predniSONE (DELTASONE) 20 MG tablet Take 3 tablets (60 mg total) by mouth daily with breakfast for 5 days. 10/09/22 10/14/22 Yes Blake Divine, MD  sodium chloride (OCEAN) 0.65 % SOLN nasal spray Place 1 spray into both nostrils as needed for congestion.   Yes [provider]  Spacer/Aero-Hold Chamber Bags MISC 1 Units by Does not apply route once for 1 dose. 08/21/22  Yes Lucillie Garfinkel, MD  amLODipine (NORVASC) 10 MG tablet Take 1 tablet (10 mg total) by mouth daily. Patient not taking: Reported on 10/11/2022 09/11/22   Alisa Graff, FNP  dapagliflozin propanediol (FARXIGA) 10 MG TABS  tablet Take 1 tablet (10 mg total) by mouth daily before breakfast. Patient not taking: Reported on 10/11/2022 09/11/22   Alisa Graff, FNP    Review of Systems  Constitutional:  Positive for fatigue. Negative for appetite change.  HENT:  Positive for congestion. Negative for postnasal drip and sore throat.   Eyes: Negative.   Respiratory:  Positive for cough and shortness of breath (improving). Negative for chest tightness and wheezing.   Cardiovascular:  Positive for palpitations (on occasion). Negative for chest pain and leg swelling.  Gastrointestinal:  Negative for abdominal distention and abdominal pain.  Endocrine: Negative.   Genitourinary: Negative.   Musculoskeletal:  Positive for back pain (chronic back pain). Negative for neck pain.  Skin: Negative.   Allergic/Immunologic: Negative.   Neurological:  Negative for dizziness and light-headedness.  Hematological:  Negative for adenopathy. Does not bruise/bleed easily.  Psychiatric/Behavioral:  Positive for sleep disturbance (trouble sleeping due to cough). Negative for dysphoric mood. The patient is not nervous/anxious.    Vitals:   10/11/22 1142  BP: (!) 159/86  Pulse: 77  Resp: 18  SpO2: 97%  Weight: (!) 469 lb (212.7 kg)   Wt Readings from Last 3 Encounters:  10/11/22 (!) 469 lb (212.7 kg)  10/09/22 (!) 474 lb 3.4 oz (215.1 kg)  08/23/22 (!) 447 lb (202.8 kg)   Lab Results  Component Value Date   CREATININE 0.59 (L) 10/09/2022   CREATININE 0.68 08/21/2022   CREATININE 0.54 (L) 08/18/2022   Physical Exam Vitals and nursing note reviewed.  Constitutional:      Appearance: Normal appearance. He is obese.  HENT:     Head: Normocephalic and atraumatic.  Cardiovascular:     Rate and Rhythm: Normal rate and regular rhythm.  Pulmonary:     Effort: Pulmonary effort is normal. No respiratory distress.     Breath sounds: Wheezing (expiratory wheezing throughout all lung fields) present.  Abdominal:     General:  There is no distension.     Palpations: Abdomen is soft.     Tenderness: There is no abdominal tenderness.  Musculoskeletal:        General: No tenderness.     Cervical back: Normal range of motion and neck supple.     Right lower leg: No edema.     Left lower leg: Edema (trace pitting) present.  Skin:    General: Skin is warm and dry.  Neurological:     General: No focal deficit present.     Mental Status: He is alert and oriented to person, place, and time.  Psychiatric:        Mood and Affect: Mood normal.        Behavior: Behavior normal.  Thought Content: Thought content normal.    Assessment & Plan:  1: Chronic heart failure with preserved ejection fraction with structural changes (LVH)- - NYHA class II - euvolemic - has resumed weighing daily; reminded to call for an overnight weight gain of > 2 pounds or a weekly weight gain of > 5 pounds - weight up 22 pounds from last visit here 6 weeks ago; had been eating out often during his move - not adding salt and has resumed diligently tracking sodium intake to keep it 2000mg ; during his move he says that he ate at cookout 4 days in a row - keeping daily fluid intake to <2L - has a sedentary job and is mentally tired once he gets home - currently on lisinopril; discuss changing this to entresto - resume farxiga; samples provided and patient assistance forms filled out today - check BMP at next visit - BNP 08/21/22 was 7.0 - stopped vaping the end of October  2: HTN- - BP 159/86 - says that he's working on getting a PCP - BMP 10/09/22 reviewed and showed sodium 138, potassium 3.4, creatinine 0.59 and GFR >60 - he is going to f/u with pharmacy regarding amlodipine  3: Sleep apnea- - NS for pulmonology ([facility]) 09/11/22 due to office needing up front money - says that he's never worn CPAP  4: Asthma- - recent diagnosis - has albuterol inhaler for PRN use   Advised to contact [facility] and [facility]  pharmacy since he continues to be uninsured.    Medication bottles reviewed.  Return in 3 weeks, sooner if needed.

## 2022-10-11 NOTE — Patient Instructions (Addendum)
Continue weighing daily and call for an overnight weight gain of 3 pounds or more or a weekly weight gain of more than 5 pounds.   If you have voicemail, please make sure your mailbox is cleaned out so that we may leave a message and please make sure to listen to any voicemails.    Go to [place] on the 1st floor of the [facility] to pick up application.    [facility]   [place] [id number]

## 2022-10-12 ENCOUNTER — Encounter: Payer: Self-pay | Admitting: Family

## 2022-10-31 ENCOUNTER — Ambulatory Visit (HOSPITAL_BASED_OUTPATIENT_CLINIC_OR_DEPARTMENT_OTHER): Payer: Self-pay | Admitting: Family

## 2022-10-31 ENCOUNTER — Encounter: Payer: Self-pay | Admitting: Family

## 2022-10-31 ENCOUNTER — Other Ambulatory Visit
Admission: RE | Admit: 2022-10-31 | Discharge: 2022-10-31 | Disposition: A | Discharge status: Home / Self Care | Payer: Medicaid Other | Source: Ambulatory Visit | Attending: Family | Admitting: Family

## 2022-10-31 VITALS — BP 127/70 | HR 86 | Resp 18 | Wt >= 6400 oz

## 2022-10-31 DIAGNOSIS — I5032 Chronic diastolic (congestive) heart failure: Secondary | ICD-10-CM

## 2022-10-31 DIAGNOSIS — J45909 Unspecified asthma, uncomplicated: Secondary | ICD-10-CM

## 2022-10-31 DIAGNOSIS — I10 Essential (primary) hypertension: Secondary | ICD-10-CM

## 2022-10-31 DIAGNOSIS — G4733 Obstructive sleep apnea (adult) (pediatric): Secondary | ICD-10-CM

## 2022-10-31 LAB — BASIC METABOLIC PANEL
Anion gap: 8 (ref 5–15)
BUN: 16 mg/dL (ref 6–20)
CO2: 26 mmol/L (ref 22–32)
Calcium: 8.8 mg/dL — ABNORMAL LOW (ref 8.9–10.3)
Chloride: 101 mmol/L (ref 98–111)
Creatinine, Ser: 0.57 mg/dL — ABNORMAL LOW (ref 0.61–1.24)
GFR, Estimated: >60 mL/min (ref >60)
Glucose, Bld: 102 mg/dL — ABNORMAL HIGH (ref 70–99)
Potassium: 3.8 mmol/L (ref 3.5–5.1)
Sodium: 135 mmol/L (ref 135–145)

## 2022-10-31 MED ORDER — SACUBITRIL-VALSARTAN 24-26 MG PO TABS: 1.0000 | ORAL_TABLET | Freq: Two times a day (BID) | ORAL | 3 refills | Status: DC

## 2022-10-31 NOTE — Patient Instructions (Addendum)
Continue weighing daily and call for an overnight weight gain of 3 pounds or more or a weekly weight gain of more than 5 pounds.   If you have voicemail, please make sure your mailbox is cleaned out so that we may leave a message and please make sure to listen to any voicemails.    Do not take anymore lisinopril. Beginning Saturday morning, you will start taking entresto as 1 tablet in the morning and 1 tablet in the evening.

## 2022-10-31 NOTE — Progress Notes (Signed)
Patient ID: Christopher Keith, male    DOB: [birth year], 37 y.o.   MRN: [id number]  HPI  Christopher Keith is a 37 y/o male with a history of HTN, sleep apnea and chronic heart failure.   Echo report from 08/14/22 showed an EF of 65-70% along with mild LVH.   Was in the ED 10/09/22 due to a/c heart failure. Was in the ED 08/21/22 due to SOB with some wheezing. CT angio negative for PE. Given nebulizer treatment and prednisone with improvement of symptoms and he was released. Admitted 08/15/22 due to shortness of breath, chest discomfort and wheezing. Given IV lasix with transition to oral diuretics. Weaned off oxygen. Discharged after 3 days. Was in the ED 08/13/22 due to SOB. Placed on bipap but unable to tolerate. CXR shows congestion.   He presents today for a follow-up visit with a chief complaint of minimal shortness of breath with moderate exertion. Describes this as chronic in nature although does feel a little worse today since it's more humid outside. Has associated fatigue along with this. Denies any difficulty sleeping, dizziness, abdominal distention, palpitations, pedal edema, chest pain, wheezing, cough or weight gain.   Says that he hasn't vaped since the end of October.   Reports having sleep apnea but has never worn CPAP.   Has resumed tracking all his food/ fluid intake on an app called cronometer  Past Medical History:  Diagnosis Date   CHF (congestive heart failure) (HCC)    Hypertension    MRSA carrier    Obesity    Sleep apnea     Past Surgical History:  Procedure Laterality Date   ADENOIDECTOMY     IRRIGATION AND DEBRIDEMENT ABSCESS Right 02/23/2019   Procedure: IRRIGATION AND DEBRIDEMENT PERI RECTAL ABSCESS;  Surgeon: Emelia Loron, MD;  Location: [facility];  Service: General;  Laterality: Right;   TONSILLECTOMY     Family History  Problem Relation Age of Onset   Gastric cancer Mother    Alcohol abuse Father    Obesity Father    Diabetes Mellitus II Brother    Social History    Tobacco Use   Smoking status: Former    Types: Cigarettes    Quit date: 08/06/2022    Years since quitting: 0.2   Smokeless tobacco: Former  Substance Use Topics   Alcohol use: Yes   No Known Allergies  Prior to Admission medications   Medication Sig Start Date End Date Taking? Authorizing Provider  albuterol (VENTOLIN HFA) 108 (90 Base) MCG/ACT inhaler Inhale 2 puffs into the lungs every 6 (six) hours as needed for wheezing or shortness of breath. 10/09/22  Yes Chesley Noon, MD  amLODipine (NORVASC) 10 MG tablet Take 1 tablet (10 mg total) by mouth daily. 09/11/22  Yes Anokhi Shannon A, FNP  ASHWAGANDHA PO Take 300 mg by mouth daily. Gummies   Yes [provider]  dapagliflozin propanediol (FARXIGA) 10 MG TABS tablet Take 1 tablet (10 mg total) by mouth daily before breakfast. 10/11/22  Yes Clarisa Kindred A, FNP  furosemide (LASIX) 40 MG tablet Take 1 tablet (40 mg total) by mouth daily. 09/11/22 09/11/23 Yes Yelena Metzer, Inetta Fermo A, FNP  lisinopril (ZESTRIL) 20 MG tablet Take 1 tablet (20 mg total) by mouth daily. 09/11/22  Yes Travia Onstad, Jarold Song, FNP  Misc Natural Products (ELDERBERRY/VITAMIN C/ZINC) CHEW Chew 2 Pieces by mouth daily. Gummies   Yes [provider]  sodium chloride (OCEAN) 0.65 % SOLN nasal spray Place 1 spray into both nostrils  as needed for congestion.   Yes [provider]  Spacer/Aero-Hold Chamber Bags MISC 1 Units by Does not apply route once for 1 dose. 08/21/22  Yes Lucillie Garfinkel, MD    Review of Systems  Constitutional:  Positive for fatigue. Negative for appetite change.  HENT:  Negative for congestion, postnasal drip and sore throat.   Eyes: Negative.   Respiratory:  Positive for shortness of breath. Negative for cough, chest tightness and wheezing.   Cardiovascular:  Negative for chest pain, palpitations and leg swelling.  Gastrointestinal:  Negative for abdominal distention and abdominal pain.  Endocrine: Negative.   Genitourinary: Negative.    Musculoskeletal:  Negative for back pain and neck pain.  Skin: Negative.   Allergic/Immunologic: Negative.   Neurological:  Negative for dizziness and light-headedness.  Hematological:  Negative for adenopathy. Does not bruise/bleed easily.  Psychiatric/Behavioral:  Negative for dysphoric mood and sleep disturbance. The patient is not nervous/anxious.    Vitals:   10/31/22 1419  BP: 127/70  Pulse: 86  Resp: 18  SpO2: 99%  Weight: (!) 462 lb (209.6 kg)   Wt Readings from Last 3 Encounters:  10/31/22 (!) 462 lb (209.6 kg)  10/11/22 (!) 469 lb (212.7 kg)  10/09/22 (!) 474 lb 3.4 oz (215.1 kg)   Lab Results  Component Value Date   CREATININE 0.59 (L) 10/09/2022   CREATININE 0.68 08/21/2022   CREATININE 0.54 (L) 08/18/2022   Physical Exam Vitals and nursing note reviewed.  Constitutional:      Appearance: Normal appearance. He is obese.  HENT:     Head: Normocephalic and atraumatic.  Cardiovascular:     Rate and Rhythm: Normal rate and regular rhythm.  Pulmonary:     Effort: Pulmonary effort is normal. No respiratory distress.     Breath sounds: Wheezing (expiratory wheezing throughout all lung fields) present.  Abdominal:     General: There is no distension.     Palpations: Abdomen is soft.     Tenderness: There is no abdominal tenderness.  Musculoskeletal:        General: No tenderness.     Cervical back: Normal range of motion and neck supple.     Right lower leg: Edema (trace pitting) present.     Left lower leg: Edema (trace pitting) present.  Skin:    General: Skin is warm and dry.  Neurological:     General: No focal deficit present.     Mental Status: He is alert and oriented to person, place, and time.  Psychiatric:        Mood and Affect: Mood normal.        Behavior: Behavior normal.        Thought Content: Thought content normal.    Assessment & Plan:  1: Chronic heart failure with preserved ejection fraction with structural changes (LVH)- - NYHA  class II - euvolemic - weighing daily; reminded to call for an overnight weight gain of > 2 pounds or a weekly weight gain of > 5 pounds - weight down 7 pounds from last visit here 3 weeks ago - not adding salt and has resumed diligently tracking sodium intake to keep it 2000mg ; did eat homemade tacos last night but didn't think they had much salt in them - keeping daily fluid intake to <2L - has a sedentary job and is mentally tired once he gets home - GDMT farxiga - stop taking lisinopril, skip tomorrow and then begin entresto 24/26mg  as 1 tablet BID beginning on  Saturday; [employer] patient assistance forms filled out today - check BMP today due to resumption of farxiga and check again at next visit due to entresto - BNP 08/21/22 was 7.0 - stopped vaping the end of October  2: HTN- - BP 127/70 - says that he's working on getting a PCP - BMP 10/09/22 reviewed and showed sodium 138, potassium 3.4, creatinine 0.59 and GFR >60  3: Sleep apnea- - NS for pulmonology ([facility]) 09/11/22 due to office needing up front money - says that he's never worn CPAP  4: Asthma- - recent diagnosis - has albuterol inhaler for PRN use - has expiratory wheezing noted  Medication Samples have been provided to the patient. Drug name: farxiga   Strength: 10mg         Qty: 3 boxes  LOT: [id number]  Exp.Date: 06/06/25 Dosing instructions: 1 tablet every morning The patient has been instructed regarding the correct time, dose, and frequency of taking this medication, including desired effects and most common side effects.    Medication list reviewed.   Return in 3 weeks, sooner if needed.

## 2022-11-01 ENCOUNTER — Other Ambulatory Visit: Payer: Self-pay | Admitting: Family

## 2022-11-01 MED ORDER — SACUBITRIL-VALSARTAN 24-26 MG PO TABS: 1.0000 | ORAL_TABLET | Freq: Two times a day (BID) | ORAL | 3 refills | Status: DC

## 2022-11-14 ENCOUNTER — Telehealth: Payer: Self-pay | Admitting: Family

## 2022-11-14 NOTE — Telephone Encounter (Signed)
Patient was approved till 10/07/23 for [place] patient assistance     [place], [place]

## 2022-11-20 ENCOUNTER — Encounter: Payer: Medicaid Other | Admitting: Family

## 2022-12-04 ENCOUNTER — Encounter: Payer: Medicaid Other | Admitting: Family

## 2022-12-04 ENCOUNTER — Telehealth: Payer: Self-pay | Admitting: Family

## 2022-12-04 NOTE — Telephone Encounter (Signed)
Patient did not show for his [facility] appointment on 12/04/22.

## 2022-12-13 ENCOUNTER — Telehealth: Payer: Self-pay | Admitting: Family

## 2022-12-13 NOTE — Telephone Encounter (Signed)
Patient called to say that he's been having more SOB and he thought it was allergy related. He does say that his SOB isn't too bad today.   He's been taking all his medications as directed. Weight is down from 465 pounds to 458 pounds. Denies any wheezing and little edema.   He missed his last appt w/ me and this has been scheduled for 12/16/22. Instructed him that should his SOB worsen over the weekend that he can take an additional furosemide until we see him on Monday.   He verbalized understanding.

## 2022-12-14 ENCOUNTER — Emergency Department
Admission: EM | Admit: 2022-12-14 | Discharge: 2022-12-14 | Disposition: A | Discharge status: Home / Self Care | Payer: Self-pay | Attending: Emergency Medicine | Admitting: Emergency Medicine

## 2022-12-14 ENCOUNTER — Other Ambulatory Visit: Payer: Self-pay

## 2022-12-14 ENCOUNTER — Emergency Department: Payer: Medicaid Other

## 2022-12-14 DIAGNOSIS — Z1152 Encounter for screening for COVID-19: Secondary | ICD-10-CM | POA: Insufficient documentation

## 2022-12-14 DIAGNOSIS — J441 Chronic obstructive pulmonary disease with (acute) exacerbation: Secondary | ICD-10-CM | POA: Insufficient documentation

## 2022-12-14 DIAGNOSIS — R0602 Shortness of breath: Secondary | ICD-10-CM

## 2022-12-14 DIAGNOSIS — R600 Localized edema: Secondary | ICD-10-CM | POA: Insufficient documentation

## 2022-12-14 DIAGNOSIS — I5033 Acute on chronic diastolic (congestive) heart failure: Secondary | ICD-10-CM | POA: Insufficient documentation

## 2022-12-14 LAB — BRAIN NATRIURETIC PEPTIDE: B Natriuretic Peptide: 9.7 pg/mL (ref 0.0–100.0)

## 2022-12-14 LAB — CBC
HCT: 47.5 % (ref 39.0–52.0)
Hemoglobin: 15.2 g/dL (ref 13.0–17.0)
MCH: 27.8 pg (ref 26.0–34.0)
MCHC: 32 g/dL (ref 30.0–36.0)
MCV: 87 fL (ref 80.0–100.0)
Platelets: 357 10*3/uL (ref 150–400)
RBC: 5.46 MIL/uL (ref 4.22–5.81)
RDW: 13.2 % (ref 11.5–15.5)
WBC: 7.9 10*3/uL (ref 4.0–10.5)
nRBC: 0 % (ref 0.0–0.2)

## 2022-12-14 LAB — BASIC METABOLIC PANEL
Anion gap: 7 (ref 5–15)
BUN: 13 mg/dL (ref 6–20)
CO2: 27 mmol/L (ref 22–32)
Calcium: 8.7 mg/dL — ABNORMAL LOW (ref 8.9–10.3)
Chloride: 101 mmol/L (ref 98–111)
Creatinine, Ser: 0.53 mg/dL — ABNORMAL LOW (ref 0.61–1.24)
GFR, Estimated: >60 mL/min (ref >60)
Glucose, Bld: 120 mg/dL — ABNORMAL HIGH (ref 70–99)
Potassium: 3.6 mmol/L (ref 3.5–5.1)
Sodium: 135 mmol/L (ref 135–145)

## 2022-12-14 LAB — RESP PANEL BY RT-PCR (RSV, FLU A&B, COVID)  RVPGX2
Influenza A by PCR: NEGATIVE
Influenza B by PCR: NEGATIVE
Resp Syncytial Virus by PCR: NEGATIVE
SARS Coronavirus 2 by RT PCR: NEGATIVE

## 2022-12-14 LAB — TROPONIN I (HIGH SENSITIVITY): Troponin I (High Sensitivity): 7 ng/L (ref <18)

## 2022-12-14 MED ORDER — METHYLPREDNISOLONE SODIUM SUCC 125 MG IJ SOLR
125.0000 mg | Freq: Once | INTRAMUSCULAR | Status: AC
  Administered 2022-12-14: 125 mg via INTRAVENOUS
  Filled 2022-12-14: qty 2

## 2022-12-14 MED ORDER — FUROSEMIDE 10 MG/ML IJ SOLN
40.0000 mg | Freq: Once | INTRAMUSCULAR | Status: AC
  Administered 2022-12-14: 40 mg via INTRAVENOUS
  Filled 2022-12-14: qty 4

## 2022-12-14 MED ORDER — IPRATROPIUM-ALBUTEROL 0.5-2.5 (3) MG/3ML IN SOLN
9.0000 mL | Freq: Once | RESPIRATORY_TRACT | Status: AC
  Administered 2022-12-14: 9 mL via RESPIRATORY_TRACT
  Filled 2022-12-14: qty 9

## 2022-12-14 MED ORDER — PREDNISONE 50 MG PO TABS: 50.0000 mg | ORAL_TABLET | Freq: Every day | ORAL | 0 refills | Status: AC

## 2022-12-14 NOTE — ED Triage Notes (Signed)
SOB.  Initial onset 10 days ago.  Started taking allergy meds, but no improvement.  Then started to feel fluid on lungs.  Reached out to [facility], and was told to take an extra lasix, which patient took last night.  Not feeling better.  States coughing a lot when laying down.

## 2022-12-14 NOTE — Discharge Instructions (Signed)
Take 40 mg Lasix twice a day (take a dose tonight) until you follow-up on Monday in clinic.  Prednisone -you are given a prescription for a steroid.  It is important that you take this medication with food.  This medication can cause an upset stomach.  Use your albuterol inhaler 2 to 4 puffs as needed for wheezing.

## 2022-12-14 NOTE — ED Provider Notes (Signed)
[facility] Provider Note    Event Date/Time   First MD Initiated Contact with Patient 12/14/22 1422     (approximate)   History   Shortness of Breath   HPI  Christopher Keith is a 37 y.o. male past medical history significant for CHF, prior history of wheezing, obesity, who presents to the emergency department with shortness of breath.  Patient endorses 2 days of progressively worsening shortness of breath.  Called his heart failure clinic and was told to take an extra dose of Lasix over the weekend if he was having ongoing shortness of breath and that they would see him on Monday.  Cough congestion over the past couple of days.  Increase sputum production.  Denies any chest pain.  No history of ischemic cardiomyopathy.  Denies any tobacco use or nicotine use.  Denies nausea vomiting or diarrhea.  Symptoms are worse when lying down.  No history of DVT or PE.     Physical Exam   Triage Vital Signs: ED Triage Vitals  Enc Vitals Group     BP 12/14/22 1412 (!) 148/95     Pulse Rate 12/14/22 1412 80     Resp 12/14/22 1412 18     Temp 12/14/22 1412 97.8 F (36.6 C)     Temp Source 12/14/22 1412 Oral     SpO2 12/14/22 1412 95 %     Weight 12/14/22 1411 (!) 468 lb 0.6 oz (212.3 kg)     Height --      Head Circumference --      Peak Flow --      Pain Score 12/14/22 1411 0     Pain Loc --      Pain Edu? --      Excl. in [place]? --     Most recent vital signs: Vitals:   12/14/22 1412 12/14/22 1630  BP: (!) 148/95 (!) 142/87  Pulse: 80   Resp: 18 19  Temp: 97.8 F (36.6 C)   SpO2: 95%     Physical Exam Constitutional:      Appearance: He is well-developed. He is obese.  HENT:     Head: Atraumatic.  Eyes:     Conjunctiva/sclera: Conjunctivae normal.  Cardiovascular:     Rate and Rhythm: Regular rhythm.     Heart sounds: No murmur heard. Pulmonary:     Effort: No respiratory distress.     Breath sounds: Wheezing and rhonchi present.  Musculoskeletal:         General: Normal range of motion.     Cervical back: Normal range of motion.     Right lower leg: Edema present.     Left lower leg: Edema present.  Skin:    General: Skin is warm.     Capillary Refill: Capillary refill takes less than 2 seconds.  Neurological:     Mental Status: He is alert. Mental status is at baseline.  Psychiatric:        Mood and Affect: Mood normal.     IMPRESSION / MDM / [place] / ED COURSE  I reviewed the triage vital signs and the nursing notes.  Differential diagnosis including heart failure exacerbation, COPD, pneumonia, viral illness including COVID/influenza, ACS  EKG  I, Nathaniel Man, the attending physician, personally viewed and interpreted this ECG.   Rate: Normal  Rhythm: Normal sinus  Axis: Normal  Intervals: Incomplete right bundle branch block  ST&T Change: None No significant change when compared to prior  EKG  No tachycardic or bradycardic dysrhythmias while on cardiac telemetry.  RADIOLOGY I independently reviewed imaging, my interpretation of imaging: Chest x-ray -no focal findings consistent with edema.  No signs of pneumonia.  Read as no acute findings.  LABS (all labs ordered are listed, but only abnormal results are displayed) Labs interpreted as -  Troponin negative.  Creatinine at baseline.  Normal BNP, given obesity may be within normal limits.  Collar no elevation in the past.  COVID and influenza testing negative.  Labs Reviewed  BASIC METABOLIC PANEL - Abnormal; Notable for the following components:      Result Value   Glucose, Bld 120 (*)    Creatinine, Ser 0.53 (*)    Calcium 8.7 (*)    All other components within normal limits  RESP PANEL BY RT-PCR (RSV, FLU A&B, COVID)  RVPGX2  CBC  BRAIN NATRIURETIC PEPTIDE  TROPONIN I (HIGH SENSITIVITY)    TREATMENT  DuoNeb, IV Solu-Medrol, IV lasix  MDM  On reevaluation patient able to have multiple episodes of urination and states he is feeling  much better.  Improvement of his shortness of breath.  Will do a short course of steroids.  Discussed increasing his Lasix dose to 40 mg twice daily until he follows up in clinic on Monday.  Return precautions given.     PROCEDURES:  Critical Care performed: No  Procedures  Patient's presentation is most consistent with acute presentation with potential threat to life or bodily function.   MEDICATIONS ORDERED IN ED: Medications  ipratropium-albuterol (DUONEB) 0.5-2.5 (3) MG/3ML nebulizer solution 9 mL (9 mLs Nebulization Given 12/14/22 1527)  methylPREDNISolone sodium succinate (SOLU-MEDROL) 125 mg/2 mL injection 125 mg (125 mg Intravenous Given 12/14/22 1542)  furosemide (LASIX) injection 40 mg (40 mg Intravenous Given 12/14/22 1553)    FINAL CLINICAL IMPRESSION(S) / ED DIAGNOSES   Final diagnoses:  SOB (shortness of breath)  Acute on chronic diastolic congestive heart failure (HCC)  COPD exacerbation ([place])     Rx / DC Orders   ED Discharge Orders     None        Note:  This document was prepared using Dragon voice recognition software and may include unintentional dictation errors.   Nathaniel Man, MD 12/14/22 [phone]

## 2022-12-15 NOTE — Progress Notes (Signed)
Patient ID: Christopher Keith, male    DOB: 12/31/1985, 37 y.o.   MRN: [id number]  HPI  Christopher Keith is a 37 y/o male with a history of HTN, sleep apnea and chronic heart failure.   Echo report from 08/14/22 showed an EF of 65-70% along with mild LVH.   Was in the ED 12/14/22 d/t SOB. Was in the ED 10/09/22 due to a/c heart failure. Was in the ED 08/21/22 due to SOB with some wheezing. CT angio negative for PE. Given nebulizer treatment and prednisone with improvement of symptoms and he was released. Admitted 08/15/22 due to shortness of breath, chest discomfort and wheezing. Given IV lasix with transition to oral diuretics. Weaned off oxygen. Discharged after 3 days. Was in the ED 08/13/22 due to SOB. Placed on bipap but unable to tolerate. CXR shows congestion.   He presents today for a HF follow-up visit with a chief complaint of minimal SOB w/ moderate exertion. Describes this as chronic in nature and improved since his recent ED visit. He has associated fatigue, head congestion, wheezing, pedal edema & weight gain along with this. Denies any difficulty sleeping, dizziness, cough, chest pain, palpitations or abdominal distention.   Says that he went to the ED because his SOB quickly worsened. Has been taking his furosemide BID since his ED visit. Reports having daily nausea since starting entresto.   Has been out of farxiga for ~ 1 week.   Past Medical History:  Diagnosis Date   CHF (congestive heart failure) (HCC)    Hypertension    MRSA carrier    Obesity    Sleep apnea     Past Surgical History:  Procedure Laterality Date   ADENOIDECTOMY     IRRIGATION AND DEBRIDEMENT ABSCESS Right 02/23/2019   Procedure: IRRIGATION AND DEBRIDEMENT PERI RECTAL ABSCESS;  Surgeon: Emelia Loron, MD;  Location: [facility] OR;  Service: General;  Laterality: Right;   TONSILLECTOMY     Family History  Problem Relation Age of Onset   Gastric cancer Mother    Alcohol abuse Father    Obesity Father    Diabetes Mellitus  II Brother    Social History   Tobacco Use   Smoking status: Former    Types: Cigarettes    Quit date: 08/06/2022    Years since quitting: 0.3   Smokeless tobacco: Former  Substance Use Topics   Alcohol use: Yes   No Known Allergies  Prior to Admission medications   Medication Sig Start Date End Date Taking? Authorizing Provider  albuterol (VENTOLIN HFA) 108 (90 Base) MCG/ACT inhaler Inhale 2 puffs into the lungs every 6 (six) hours as needed for wheezing or shortness of breath. 10/09/22  Yes Chesley Noon, MD  amLODipine (NORVASC) 10 MG tablet Take 1 tablet (10 mg total) by mouth daily. 09/11/22  Yes Sola Margolis A, FNP  ASHWAGANDHA PO Take 300 mg by mouth daily. Gummies   Yes [provider]  furosemide (LASIX) 40 MG tablet Take 1 tablet (40 mg total) by mouth daily. 09/11/22 09/11/23 Yes Delma Freeze, FNP  Misc Natural Products (ELDERBERRY/VITAMIN C/ZINC) CHEW Chew 2 Pieces by mouth daily. Gummies   Yes [provider]  sacubitril-valsartan (ENTRESTO) 24-26 MG Take 1 tablet by mouth 2 (two) times daily. 11/01/22  Yes Chrisangel Eskenazi, Inetta Fermo A, FNP  sodium chloride (OCEAN) 0.65 % SOLN nasal spray Place 1 spray into both nostrils as needed for congestion.   Yes [provider]  Spacer/Aero-Hold Chamber Bags MISC 1 Units  by Does not apply route once for 1 dose. 08/21/22  Yes Pilar Jarvis, MD  dapagliflozin propanediol (FARXIGA) 10 MG TABS tablet Take 1 tablet (10 mg total) by mouth daily before breakfast. Patient not taking: Reported on 12/16/2022 10/11/22   Delma Freeze, FNP  predniSONE (DELTASONE) 50 MG tablet Take 1 tablet (50 mg total) by mouth daily with breakfast for 3 days. Patient not taking: Reported on 12/16/2022 12/14/22 12/17/22  Corena Herter, MD   Review of Systems  Constitutional:  Positive for fatigue. Negative for appetite change.  HENT:  Positive for congestion. Negative for postnasal drip and sore throat.   Eyes: Negative.   Respiratory:  Positive for  shortness of breath (minimal) and wheezing. Negative for cough and chest tightness.   Cardiovascular:  Positive for leg swelling. Negative for chest pain and palpitations.  Gastrointestinal:  Positive for nausea (with entresto). Negative for abdominal distention and abdominal pain.  Endocrine: Negative.   Genitourinary: Negative.   Musculoskeletal:  Negative for back pain and neck pain.  Skin: Negative.   Allergic/Immunologic: Negative.   Neurological:  Negative for dizziness and light-headedness.  Hematological:  Negative for adenopathy. Does not bruise/bleed easily.  Psychiatric/Behavioral:  Negative for dysphoric mood and sleep disturbance. The patient is not nervous/anxious.    Vitals:   12/16/22 1124  BP: 128/81  Pulse: 79  Resp: 16  SpO2: 97%  Weight: (!) 472 lb 2 oz (214.2 kg)   Wt Readings from Last 3 Encounters:  12/16/22 (!) 472 lb 2 oz (214.2 kg)  12/14/22 (!) 468 lb 0.6 oz (212.3 kg)  10/31/22 (!) 462 lb (209.6 kg)   Lab Results  Component Value Date   CREATININE 0.53 (L) 12/14/2022   CREATININE 0.57 (L) 10/31/2022   CREATININE 0.59 (L) 10/09/2022   Physical Exam Vitals and nursing note reviewed.  Constitutional:      Appearance: Normal appearance. He is obese.  HENT:     Head: Normocephalic and atraumatic.  Cardiovascular:     Rate and Rhythm: Normal rate and regular rhythm.  Pulmonary:     Effort: Pulmonary effort is normal. No respiratory distress.     Breath sounds: Wheezing (inspiratory wheezing throughout all lung fields) present.  Abdominal:     General: There is no distension.     Palpations: Abdomen is soft.     Tenderness: There is no abdominal tenderness.  Musculoskeletal:        General: No tenderness.     Cervical back: Normal range of motion and neck supple.     Right lower leg: Edema (1+ pitting) present.     Left lower leg: Edema (1+ pitting) present.  Skin:    General: Skin is warm and dry.  Neurological:     General: No focal deficit  present.     Mental Status: He is alert and oriented to person, place, and time.  Psychiatric:        Mood and Affect: Mood normal.        Behavior: Behavior normal.        Thought Content: Thought content normal.    Assessment & Plan:  1: Chronic heart failure with preserved ejection fraction with structural changes (LVH)- - NYHA class II - euvolemic - weighing daily; reminded to call for an overnight weight gain of > 2 pounds or a weekly weight gain of > 5 pounds - weight up 10 pounds from last visit here 6 weeks ago - not adding salt and diligently tracking sodium  intake to keep it 2000mg  - keeping daily fluid intake to <2L - has a sedentary job and is mentally tired once he gets home - farxiga 10mg  daily although has been out of this for ~ 1 week; 2 weeks samples provided and # to patient assistance provided as he was approved for assistance for this calendar year - entresto 24/26mg  BID; has had daily nausea since starting this ~ 1 month ago; stop this and resume lisinopril 20mg  daily - stop furosemide and begin torsemide 40mg  BID - BMP next visit - BNP 12/14/22 was 9,7 - stopped vaping the end of October  2: HTN- - BP 128/81 - says that he's working on getting a PCP - BMP 12/14/22 reviewed and showed sodium 135, potassium 3.6, creatinine 0.53 and GFR >60  3: Sleep apnea- - NS for pulmonology ([facility]) 09/11/22 due to office needing up front money - says that he's never worn CPAP  4: Asthma- - recent diagnosis - has albuterol inhaler for PRN use - has inspiratory wheezing noted - has to pick up prednisone from his recent ED visit   Medication Samples have been provided to the patient.  Drug name: farxiga       Strength: 10mg         Qty: 2 boxes  LOT: [id number]  Exp.Date: 06/06/25  Dosing instructions: 1 tablet once daily  The patient has been instructed regarding the correct time, dose, and frequency of taking this medication, including desired effects and most common side  effects.   Delma Freeze 11:55 AM 12/16/2022

## 2022-12-16 ENCOUNTER — Encounter: Payer: Self-pay | Admitting: Family

## 2022-12-16 ENCOUNTER — Ambulatory Visit: Payer: Self-pay | Attending: Family | Admitting: Family

## 2022-12-16 VITALS — BP 128/81 | HR 79 | Resp 16 | Wt >= 6400 oz

## 2022-12-16 DIAGNOSIS — G4733 Obstructive sleep apnea (adult) (pediatric): Secondary | ICD-10-CM

## 2022-12-16 DIAGNOSIS — Z87891 Personal history of nicotine dependence: Secondary | ICD-10-CM | POA: Insufficient documentation

## 2022-12-16 DIAGNOSIS — R110 Nausea: Secondary | ICD-10-CM | POA: Insufficient documentation

## 2022-12-16 DIAGNOSIS — G4730 Sleep apnea, unspecified: Secondary | ICD-10-CM | POA: Insufficient documentation

## 2022-12-16 DIAGNOSIS — E669 Obesity, unspecified: Secondary | ICD-10-CM | POA: Insufficient documentation

## 2022-12-16 DIAGNOSIS — I10 Essential (primary) hypertension: Secondary | ICD-10-CM

## 2022-12-16 DIAGNOSIS — I110 Hypertensive heart disease with heart failure: Secondary | ICD-10-CM | POA: Insufficient documentation

## 2022-12-16 DIAGNOSIS — Z79899 Other long term (current) drug therapy: Secondary | ICD-10-CM | POA: Insufficient documentation

## 2022-12-16 DIAGNOSIS — J45909 Unspecified asthma, uncomplicated: Secondary | ICD-10-CM | POA: Insufficient documentation

## 2022-12-16 DIAGNOSIS — I5032 Chronic diastolic (congestive) heart failure: Secondary | ICD-10-CM | POA: Insufficient documentation

## 2022-12-16 MED ORDER — TORSEMIDE 20 MG PO TABS: 40.0000 mg | ORAL_TABLET | Freq: Two times a day (BID) | ORAL | 3 refills | Status: DC

## 2022-12-16 NOTE — Patient Instructions (Addendum)
Call [employer] patient assistance (farxiga) to get your medication shipped to you. [phone]   Stop taking furosemide and begin taking torsemide '40mg'$  twice daily.    Stop taking entresto and resume your lisinopril as '20mg'$  once daily.

## 2023-01-16 ENCOUNTER — Encounter: Payer: Medicaid Other | Admitting: Family

## 2023-01-29 ENCOUNTER — Telehealth: Payer: Self-pay | Admitting: Family

## 2023-01-29 ENCOUNTER — Encounter: Payer: Medicaid Other | Admitting: Family

## 2023-01-29 NOTE — Telephone Encounter (Signed)
Patient did not show for his Heart Failure Clinic appointment on 01/29/23.

## 2023-03-19 ENCOUNTER — Other Ambulatory Visit: Payer: Self-pay | Admitting: Family

## 2023-03-27 ENCOUNTER — Other Ambulatory Visit: Payer: Self-pay

## 2023-03-27 DIAGNOSIS — I5032 Chronic diastolic (congestive) heart failure: Secondary | ICD-10-CM

## 2023-03-27 MED ORDER — LISINOPRIL 20 MG PO TABS: 20.0000 mg | ORAL_TABLET | Freq: Every day | ORAL | 0 refills | Status: DC

## 2023-04-11 ENCOUNTER — Other Ambulatory Visit
Admission: RE | Admit: 2023-04-11 | Discharge: 2023-04-11 | Disposition: A | Discharge status: Home / Self Care | Payer: Self-pay | Source: Ambulatory Visit | Attending: Family | Admitting: Family

## 2023-04-11 ENCOUNTER — Encounter: Payer: Self-pay | Admitting: Family

## 2023-04-11 ENCOUNTER — Telehealth: Payer: Self-pay

## 2023-04-11 ENCOUNTER — Ambulatory Visit (HOSPITAL_BASED_OUTPATIENT_CLINIC_OR_DEPARTMENT_OTHER): Payer: Self-pay | Admitting: Family

## 2023-04-11 VITALS — BP 132/67 | HR 82 | Ht 67.75 in | Wt >= 6400 oz

## 2023-04-11 DIAGNOSIS — I10 Essential (primary) hypertension: Secondary | ICD-10-CM

## 2023-04-11 DIAGNOSIS — J45909 Unspecified asthma, uncomplicated: Secondary | ICD-10-CM

## 2023-04-11 DIAGNOSIS — I5032 Chronic diastolic (congestive) heart failure: Secondary | ICD-10-CM

## 2023-04-11 DIAGNOSIS — G4733 Obstructive sleep apnea (adult) (pediatric): Secondary | ICD-10-CM

## 2023-04-11 LAB — BASIC METABOLIC PANEL
Anion gap: 13 (ref 5–15)
BUN: 15 mg/dL (ref 6–20)
CO2: 25 mmol/L (ref 22–32)
Calcium: 8.3 mg/dL — ABNORMAL LOW (ref 8.9–10.3)
Chloride: 93 mmol/L — ABNORMAL LOW (ref 98–111)
Creatinine, Ser: 0.59 mg/dL — ABNORMAL LOW (ref 0.61–1.24)
GFR, Estimated: >60 mL/min (ref >60)
Glucose, Bld: 147 mg/dL — ABNORMAL HIGH (ref 70–99)
Potassium: 3.1 mmol/L — ABNORMAL LOW (ref 3.5–5.1)
Sodium: 131 mmol/L — ABNORMAL LOW (ref 135–145)

## 2023-04-11 MED ORDER — POTASSIUM CHLORIDE CRYS ER 20 MEQ PO TBCR: 20.0000 meq | EXTENDED_RELEASE_TABLET | Freq: Every day | ORAL | 3 refills | Status: AC

## 2023-04-11 NOTE — Progress Notes (Signed)
PCP: none Primary Cardiologist: none  HPI:  Christopher Keith is a 37 y/o male with a history of HTN, sleep apnea and chronic heart failure.   Echo 08/14/22: EF of 65-70% along with mild LVH.   Was in the ED 12/14/22 d/t SOB. Was in the ED 10/09/22 due to a/c heart failure. Was in the ED 08/21/22 due to SOB with some wheezing. CT angio negative for PE. Given nebulizer treatment and prednisone with improvement of symptoms and he was released.   He presents today for a HF follow-up visit with a chief complaint of mild pedal edema. Chronic in nature. Says that it tends to be worse if he's home sitting for long periods of time and not as bad when he's at work on his feet. Has no other symptoms and specifically denies fatigue, SOB, chest pain, palpitations, abdominal distention, dizziness or difficulty sleeping.   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 Continues to weigh daily and weight has been 471 at home. Not adding any salt.   Asking for [facility] assistance paperwork as he says that he went to social services and was told that he doesn't qualify for any assistance with his expenses nor traditional medicaid. Says that he's making considerably less now than at his previous employment.   ROS: All systems negative except as listed in HPI, PMH and Problem List.  SH:  Social History   Socioeconomic History   Marital status: Married    Spouse name: Not on file   Number of children: Not on file   Years of education: Not on file   Highest education level: Not on file  Occupational History   Not on file  Tobacco Use   Smoking status: Former    Types: Cigarettes    Quit date: 08/06/2022    Years since quitting: 0.6   Smokeless tobacco: Former  Substance and Sexual Activity   Alcohol use: Yes   Drug use: Never   Sexual activity: Not on file  Other Topics Concern   Not on file  Social History Narrative   Not on file   Social Determinants of Health   Financial  Resource Strain: Not on file  Food Insecurity: No Food Insecurity (08/16/2022)   Hunger Vital Sign    Worried About Running Out of Food in the Last Year: Never true    Ran Out of Food in the Last Year: Never true  Transportation Needs: No Transportation Needs (08/16/2022)   PRAPARE - Administrator, Civil Service (Medical): No    Lack of Transportation (Non-Medical): No  Physical Activity: Not on file  Stress: Not on file  Social Connections: Not on file  Intimate Partner Violence: Not At Risk (08/16/2022)   Humiliation, Afraid, Rape, and Kick questionnaire    Fear of Current or Ex-Partner: No    Emotionally Abused: No    Physically Abused: No    Sexually Abused: No    FH:  Family History  Problem Relation Age of Onset   Gastric cancer Mother    Alcohol abuse Father    Obesity Father    Diabetes Mellitus II Brother     Past Medical History:  Diagnosis Date   CHF (congestive heart failure) (HCC)    Hypertension    MRSA carrier    Obesity    Sleep apnea     Current Outpatient Medications  Medication Sig Dispense Refill   albuterol (VENTOLIN HFA)  108 (90 Base) MCG/ACT inhaler Inhale 2 puffs into the lungs every 6 (six) hours as needed for wheezing or shortness of breath. 8 g 0   amLODipine (NORVASC) 10 MG tablet Take 1 tablet (10 mg total) by mouth daily. 30 tablet 5   ASHWAGANDHA PO Take 300 mg by mouth daily. Gummies     dapagliflozin propanediol (FARXIGA) 10 MG TABS tablet Take 1 tablet (10 mg total) by mouth daily before breakfast. (Patient not taking: Reported on 12/16/2022) 90 tablet 3   lisinopril (ZESTRIL) 20 MG tablet Take 1 tablet (20 mg total) by mouth daily. 90 tablet 0   Misc Natural Products (ELDERBERRY/VITAMIN C/ZINC) CHEW Chew 2 Pieces by mouth daily. Gummies     sodium chloride (OCEAN) 0.65 % SOLN nasal spray Place 1 spray into both nostrils as needed for congestion.     Spacer/Aero-Hold Chamber Bags MISC 1 Units by Does not apply route once for  1 dose. 1 Units 0   torsemide (DEMADEX) 20 MG tablet Take 2 tablets (40 mg total) by mouth 2 (two) times daily. 120 tablet 3   No current facility-administered medications for this visit.   Vitals:   04/11/23 1058  BP: 132/67  Pulse: 82  SpO2: 97%  Weight: (!) 477 lb (216.4 kg)  Height: 5' 7.75" (1.721 m)   Wt Readings from Last 3 Encounters:  04/11/23 (!) 477 lb (216.4 kg)  12/16/22 (!) 472 lb 2 oz (214.2 kg)  12/14/22 (!) 468 lb 0.6 oz (212.3 kg)   Lab Results  Component Value Date   CREATININE 0.53 (L) 12/14/2022   CREATININE 0.57 (L) 10/31/2022   CREATININE 0.59 (L) 10/09/2022    PHYSICAL EXAM:  General:  Well appearing. No resp difficulty HEENT: normal Neck: supple. JVP flat. No lymphadenopathy or thryomegaly appreciated. Cor: PMI normal. Regular rate & rhythm. No rubs, gallops or murmurs. Lungs: clear Abdomen: soft, nontender, nondistended. No hepatosplenomegaly. No bruits or masses. Good bowel sounds. Extremities: no cyanosis, clubbing, rash, trace pitting edema bilateral lower legs Neuro: alert & orientedx3, cranial nerves grossly intact. Moves all 4 extremities w/o difficulty. Affect pleasant.   ECG: not done   ASSESSMENT & PLAN:  1: NICM with preserved ejection fraction with structural changes (LVH)- - suspect due to HTN and untreated OSA - NYHA class I - euvolemic - weighing daily; reminded to call for an overnight weight gain of > 2 pounds or a weekly weight gain of > 5 pounds - weight up 5 pounds from last visit here 4 months ago - Echo 08/14/22: EF of 65-70% along with mild LVH.  - not adding salt and diligently tracking sodium intake to keep it 2000mg  - keeping daily fluid intake to <2L - now has a physically active job but is making less money - [facility] assistance paperwork provided to him today - continue farxiga 10mg  daily - continue lisinopril 20mg  daily (had daily nausea when on entresto; tolerating lisinopril without issues) - continue  torsemide 40mg  BID - BMP today - BNP 12/14/22 was 9,7 - stopped vaping the end of October  2: HTN- - BP 132/67 - since he has less income, application given to him for [facility]; he says that the difficulty in scheduling something now is that his hours fluctuate considerably - BMP 12/14/22 reviewed and showed sodium 135, potassium 3.6, creatinine 0.53 and GFR >60 - BMP today  3: Sleep apnea- - NS for pulmonology ([facility]) 09/11/22 due to office needing up front money - says that he's  never worn CPAP  4: Asthma- - has albuterol inhaler for PRN use - lungs clear today  Return in 3 months, sooner if needed

## 2023-04-11 NOTE — Patient Instructions (Addendum)
[facility]   [phone] [address] [place] [address]   Go to the [facility] to get your lab work done.

## 2023-04-11 NOTE — Telephone Encounter (Addendum)
Spoke with patient to review lab results and provider recommendations below.  Patient verbalized understanding that they will begin taking 20 meq potassium daily and will recheck lab work at next appointment.  He denies any further questions or concerns at this time. Pt's preferred pharmacy confirmed and Rx sent.   ----- Message from Delma Freeze, FNP sent at 04/11/2023 12:26 PM EDT ----- Start potassium daily. Recheck this at next visit

## 2023-07-11 NOTE — Progress Notes (Deleted)
PCP: none Primary Cardiologist: none  HPI:  Christopher Keith is a 37 y/o male with a history of HTN, sleep apnea and chronic heart failure.   Echo 08/14/22: EF of 65-70% along with mild LVH.   Was in the ED 12/14/22 d/t SOB. Was in the ED 10/09/22 due to a/c heart failure. Was in the ED 08/21/22 due to SOB with some wheezing. CT angio negative for PE. Given nebulizer treatment and prednisone with improvement of symptoms and he was released.   He presents today for a HF follow-up visit with a chief complaint of mild pedal edema. Chronic in nature. Says that it tends to be worse if he's home sitting for long periods of time and not as bad when he's at work on his feet. Has no other symptoms and specifically denies fatigue, SOB, chest pain, palpitations, abdominal distention, dizziness or difficulty sleeping.   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 Continues to weigh daily and weight has been 471 at home. Not adding any salt.   Asking for [facility] assistance paperwork as he says that he went to social services and was told that he doesn't qualify for any assistance with his expenses nor traditional medicaid. Says that he's making considerably less now than at his previous employment.   ROS: All systems negative except as listed in HPI, PMH and Problem List.  SH:  Social History   Socioeconomic History   Marital status: Married    Spouse name: Not on file   Number of children: Not on file   Years of education: Not on file   Highest education level: Not on file  Occupational History   Not on file  Tobacco Use   Smoking status: Former    Current packs/day: 0.00    Types: Cigarettes    Quit date: 08/06/2022    Years since quitting: 0.9   Smokeless tobacco: Former  Substance and Sexual Activity   Alcohol use: Yes   Drug use: Never   Sexual activity: Not on file  Other Topics Concern   Not on file  Social History Narrative   Not on file   Social Determinants  of Health   Financial Resource Strain: Not on file  Food Insecurity: No Food Insecurity (08/16/2022)   Hunger Vital Sign    Worried About Running Out of Food in the Last Year: Never true    Ran Out of Food in the Last Year: Never true  Transportation Needs: No Transportation Needs (08/16/2022)   PRAPARE - Administrator, Civil Service (Medical): No    Lack of Transportation (Non-Medical): No  Physical Activity: Not on file  Stress: Not on file  Social Connections: Not on file  Intimate Partner Violence: Not At Risk (08/16/2022)   Humiliation, Afraid, Rape, and Kick questionnaire    Fear of Current or Ex-Partner: No    Emotionally Abused: No    Physically Abused: No    Sexually Abused: No    FH:  Family History  Problem Relation Age of Onset   Gastric cancer Mother    Alcohol abuse Father    Obesity Father    Diabetes Mellitus II Brother     Past Medical History:  Diagnosis Date   CHF (congestive heart failure) (HCC)    Hypertension    MRSA carrier    Obesity    Sleep apnea     Current Outpatient Medications  Medication Sig Dispense  Refill   albuterol (VENTOLIN HFA) 108 (90 Base) MCG/ACT inhaler Inhale 2 puffs into the lungs every 6 (six) hours as needed for wheezing or shortness of breath. 8 g 0   amLODipine (NORVASC) 10 MG tablet Take 1 tablet (10 mg total) by mouth daily. 30 tablet 5   dapagliflozin propanediol (FARXIGA) 10 MG TABS tablet Take 1 tablet (10 mg total) by mouth daily before breakfast. 90 tablet 3   lisinopril (ZESTRIL) 20 MG tablet Take 1 tablet (20 mg total) by mouth daily. 90 tablet 0   potassium chloride SA (KLOR-CON M) 20 MEQ tablet Take 1 tablet (20 mEq total) by mouth daily. 1 tablet 3   Spacer/Aero-Hold Chamber Bags MISC 1 Units by Does not apply route once for 1 dose. 1 Units 0   torsemide (DEMADEX) 20 MG tablet Take 2 tablets (40 mg total) by mouth 2 (two) times daily. 120 tablet 3   No current facility-administered medications for  this visit.   There were no vitals filed for this visit.  Wt Readings from Last 3 Encounters:  04/11/23 (!) 477 lb (216.4 kg)  12/16/22 (!) 472 lb 2 oz (214.2 kg)  12/14/22 (!) 468 lb 0.6 oz (212.3 kg)   Lab Results  Component Value Date   CREATININE 0.59 (L) 04/11/2023   CREATININE 0.53 (L) 12/14/2022   CREATININE 0.57 (L) 10/31/2022    PHYSICAL EXAM:  General:  Well appearing. No resp difficulty HEENT: normal Neck: supple. JVP flat. No lymphadenopathy or thryomegaly appreciated. Cor: PMI normal. Regular rate & rhythm. No rubs, gallops or murmurs. Lungs: clear Abdomen: soft, nontender, nondistended. No hepatosplenomegaly. No bruits or masses. Good bowel sounds. Extremities: no cyanosis, clubbing, rash, trace pitting edema bilateral lower legs Neuro: alert & orientedx3, cranial nerves grossly intact. Moves all 4 extremities w/o difficulty. Affect pleasant.   ECG: not done   ASSESSMENT & PLAN:  1: NICM with preserved ejection fraction with structural changes (LVH)- - suspect due to HTN and untreated OSA - NYHA class I - euvolemic - weighing daily; reminded to call for an overnight weight gain of > 2 pounds or a weekly weight gain of > 5 pounds - weight up 5 pounds from last visit here 4 months ago - Echo 08/14/22: EF of 65-70% along with mild LVH.  - not adding salt and diligently tracking sodium intake to keep it 2000mg  - keeping daily fluid intake to <2L - now has a physically active job but is making less money - [facility] assistance paperwork provided to him today - continue farxiga 10mg  daily - continue lisinopril 20mg  daily (had daily nausea when on entresto; tolerating lisinopril without issues) - continue torsemide 40mg  BID - BMP today - BNP 12/14/22 was 9,7 - stopped vaping the end of October  2: HTN- - BP 132/67 - since he has less income, application given to him for [facility]; he says that the difficulty in scheduling something now is that his hours  fluctuate considerably - BMP 12/14/22 reviewed and showed sodium 135, potassium 3.6, creatinine 0.53 and GFR >60 - BMP today  3: Sleep apnea- - NS for pulmonology ([facility]) 09/11/22 due to office needing up front money - says that he's never worn CPAP  4: Asthma- - has albuterol inhaler for PRN use - lungs clear today  Return in 3 months, sooner if needed

## 2023-07-14 ENCOUNTER — Encounter: Payer: Medicaid Other | Admitting: Family

## 2023-07-14 ENCOUNTER — Telehealth: Payer: Self-pay | Admitting: Family

## 2023-07-14 NOTE — Telephone Encounter (Signed)
Patient did not show for his Heart Failure Clinic appointment on 07/14/23.

## 2023-08-15 ENCOUNTER — Telehealth: Payer: Self-pay | Admitting: Family

## 2023-08-15 ENCOUNTER — Other Ambulatory Visit: Payer: Self-pay

## 2023-08-15 DIAGNOSIS — I5032 Chronic diastolic (congestive) heart failure: Secondary | ICD-10-CM

## 2023-08-15 MED ORDER — LISINOPRIL 20 MG PO TABS: 20.0000 mg | ORAL_TABLET | Freq: Every day | ORAL | 1 refills | Status: DC

## 2023-08-15 MED ORDER — TORSEMIDE 20 MG PO TABS: 40.0000 mg | ORAL_TABLET | Freq: Two times a day (BID) | ORAL | 5 refills | Status: DC

## 2023-08-15 MED ORDER — AMLODIPINE BESYLATE 10 MG PO TABS: 10.0000 mg | ORAL_TABLET | Freq: Every day | ORAL | 5 refills | Status: DC

## 2023-08-15 NOTE — Progress Notes (Deleted)
PCP: none Primary Cardiologist: none  HPI:  Christopher Keith is a 37 y/o male with a history of HTN, sleep apnea and chronic heart failure.   Was in the ED 08/21/22 due to SOB with some wheezing. CT angio negative for PE. Given nebulizer treatment and prednisone with improvement of symptoms and he was released. Was in the ED 10/09/22 due to a/c heart failure. Was in the ED 12/14/22 d/t SOB.   Echo 08/14/22: EF 65-70% along with mild LVH.   He presents today for a HF follow-up visit with a chief complaint of    At last visit, potassium was started but then he didn't show for his f/u appt. Will need to get BMET today.   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   Asking for [facility] assistance paperwork as he says that he went to social services and was told that he doesn't qualify for any assistance with his expenses nor traditional medicaid. Says that he's making considerably less now than at his previous employment.   ROS: All systems negative except as listed in HPI, PMH and Problem List.  SH:  Social History   Socioeconomic History   Marital status: Married    Spouse name: Not on file   Number of children: Not on file   Years of education: Not on file   Highest education level: Not on file  Occupational History   Not on file  Tobacco Use   Smoking status: Former    Current packs/day: 0.00    Types: Cigarettes    Quit date: 08/06/2022    Years since quitting: 1.0   Smokeless tobacco: Former  Substance and Sexual Activity   Alcohol use: Yes   Drug use: Never   Sexual activity: Not on file  Other Topics Concern   Not on file  Social History Narrative   Not on file   Social Determinants of Health   Financial Resource Strain: Not on file  Food Insecurity: No Food Insecurity (08/16/2022)   Hunger Vital Sign    Worried About Running Out of Food in the Last Year: Never true    Ran Out of Food in the Last Year: Never true  Transportation Needs: No  Transportation Needs (08/16/2022)   PRAPARE - Administrator, Civil Service (Medical): No    Lack of Transportation (Non-Medical): No  Physical Activity: Not on file  Stress: Not on file  Social Connections: Not on file  Intimate Partner Violence: Not At Risk (08/16/2022)   Humiliation, Afraid, Rape, and Kick questionnaire    Fear of Current or Ex-Partner: No    Emotionally Abused: No    Physically Abused: No    Sexually Abused: No    FH:  Family History  Problem Relation Age of Onset   Gastric cancer Mother    Alcohol abuse Father    Obesity Father    Diabetes Mellitus II Brother     Past Medical History:  Diagnosis Date   CHF (congestive heart failure) (HCC)    Hypertension    MRSA carrier    Obesity    Sleep apnea     Current Outpatient Medications  Medication Sig Dispense Refill   albuterol (VENTOLIN HFA) 108 (90 Base) MCG/ACT inhaler Inhale 2 puffs into the lungs every 6 (six) hours as needed for wheezing or shortness of breath. 8 g 0   amLODipine (NORVASC) 10 MG tablet Take 1 tablet (10  mg total) by mouth daily. 30 tablet 5   dapagliflozin propanediol (FARXIGA) 10 MG TABS tablet Take 1 tablet (10 mg total) by mouth daily before breakfast. 90 tablet 3   lisinopril (ZESTRIL) 20 MG tablet Take 1 tablet (20 mg total) by mouth daily. 90 tablet 1   potassium chloride SA (KLOR-CON M) 20 MEQ tablet Take 1 tablet (20 mEq total) by mouth daily. 1 tablet 3   Spacer/Aero-Hold Chamber Bags MISC 1 Units by Does not apply route once for 1 dose. 1 Units 0   torsemide (DEMADEX) 20 MG tablet Take 2 tablets (40 mg total) by mouth 2 (two) times daily. 120 tablet 5   No current facility-administered medications for this visit.      PHYSICAL EXAM:  General:  Well appearing. No resp difficulty HEENT: normal Neck: supple. JVP flat. No lymphadenopathy or thryomegaly appreciated. Cor: PMI normal. Regular rate & rhythm. No rubs, gallops or murmurs. Lungs: clear Abdomen:  soft, nontender, nondistended. No hepatosplenomegaly. No bruits or masses. Good bowel sounds. Extremities: no cyanosis, clubbing, rash, trace pitting edema bilateral lower legs Neuro: alert & orientedx3, cranial nerves grossly intact. Moves all 4 extremities w/o difficulty. Affect pleasant.   ECG: not done   ASSESSMENT & PLAN:  1: NICM with preserved ejection fraction with structural changes (LVH)- - suspect due to HTN and untreated OSA - NYHA class I - euvolemic - weighing daily; reminded to call for an overnight weight gain of > 2 pounds or a weekly weight gain of > 5 pounds - weight 477 pounds from last visit here 4 months ago - Echo 08/14/22: EF of 65-70% along with mild LVH.  - not adding salt and diligently tracking sodium intake to keep it 2000mg  - keeping daily fluid intake to <2L - now has a physically active job but is making less money - [facility] assistance paperwork provided to him today - continue farxiga 10mg  daily - continue lisinopril 20mg  daily (had daily nausea when on entresto; tolerating lisinopril without issues) - continue torsemide 40mg  BID - continue potassium daily - BMET today - BNP 12/14/22 was 9,7 - stopped vaping the end of October  2: HTN- - BP  - since he has less income, application given to him for [facility]; he says that the difficulty in scheduling something now is that his hours fluctuate considerably - BMP 04/11/23 reviewed and showed sodium 131, potassium 3.1, creatinine 0.59 and GFR >60   3: Sleep apnea- - NS for pulmonology ([facility]) 09/11/22 due to office needing up front money - says that he's never worn CPAP  4: Asthma- - has albuterol inhaler for PRN use - lungs clear today

## 2023-08-15 NOTE — Progress Notes (Signed)
Medication refills sent to [facility] pharmacy on [address] fr Amlodipine, and Lisinopril, and Torsemide.

## 2023-08-18 ENCOUNTER — Encounter: Payer: Medicaid Other | Admitting: Family

## 2023-08-18 ENCOUNTER — Other Ambulatory Visit: Payer: Self-pay

## 2023-08-18 DIAGNOSIS — I5032 Chronic diastolic (congestive) heart failure: Secondary | ICD-10-CM

## 2023-08-18 MED ORDER — LISINOPRIL 20 MG PO TABS: 20.0000 mg | ORAL_TABLET | Freq: Every day | ORAL | 3 refills | Status: AC

## 2023-08-18 MED ORDER — AMLODIPINE BESYLATE 10 MG PO TABS: 10.0000 mg | ORAL_TABLET | Freq: Every day | ORAL | 3 refills | Status: AC

## 2023-08-18 MED ORDER — DAPAGLIFLOZIN PROPANEDIOL 10 MG PO TABS: 10.0000 mg | ORAL_TABLET | Freq: Every day | ORAL | 3 refills | Status: AC

## 2023-08-19 ENCOUNTER — Encounter: Payer: Medicaid Other | Admitting: Family

## 2023-08-19 ENCOUNTER — Telehealth: Payer: Self-pay | Admitting: Family

## 2023-08-19 NOTE — Telephone Encounter (Signed)
Patient did not show for his Heart Failure Clinic appointment on 08/19/23.

## 2023-11-06 ENCOUNTER — Observation Stay
Admission: EM | Admit: 2023-11-06 | Discharge: 2023-11-08 | Disposition: A | Discharge status: Home / Self Care | Payer: Self-pay | Attending: Internal Medicine | Admitting: Internal Medicine

## 2023-11-06 DIAGNOSIS — I4891 Unspecified atrial fibrillation: Secondary | ICD-10-CM | POA: Diagnosis not present

## 2023-11-06 DIAGNOSIS — I110 Hypertensive heart disease with heart failure: Secondary | ICD-10-CM | POA: Insufficient documentation

## 2023-11-06 DIAGNOSIS — I5032 Chronic diastolic (congestive) heart failure: Secondary | ICD-10-CM

## 2023-11-06 DIAGNOSIS — I10 Essential (primary) hypertension: Secondary | ICD-10-CM | POA: Diagnosis present

## 2023-11-06 DIAGNOSIS — Z7901 Long term (current) use of anticoagulants: Secondary | ICD-10-CM | POA: Diagnosis not present

## 2023-11-06 DIAGNOSIS — Z87891 Personal history of nicotine dependence: Secondary | ICD-10-CM | POA: Diagnosis not present

## 2023-11-06 DIAGNOSIS — Z6841 Body Mass Index (BMI) 40.0 and over, adult: Secondary | ICD-10-CM | POA: Diagnosis not present

## 2023-11-06 DIAGNOSIS — Z79899 Other long term (current) drug therapy: Secondary | ICD-10-CM | POA: Insufficient documentation

## 2023-11-06 DIAGNOSIS — R002 Palpitations: Secondary | ICD-10-CM | POA: Diagnosis present

## 2023-11-06 DIAGNOSIS — R739 Hyperglycemia, unspecified: Secondary | ICD-10-CM | POA: Insufficient documentation

## 2023-11-06 DIAGNOSIS — R079 Chest pain, unspecified: Secondary | ICD-10-CM

## 2023-11-06 NOTE — ED Triage Notes (Signed)
  Patient C/O palpitations and shortness of breath that started while he was lying down at home about thirty minutes ago. Patient also states that his left arm feels numb, though that has resolved some. Lastly, he is C/O pressure under his left breast. Patient has history of CHF and HTN.

## 2023-11-06 NOTE — ED Triage Notes (Signed)
Patient C/O palpitations and shortness of breath that started while he was lying down at home about thirty minutes ago. Patient also states that his left arm feels numb, though that has resolved some. Lastly, he is C/O pressure under his left breast. Patient has history of CHF and HTN.

## 2023-11-07 ENCOUNTER — Emergency Department: Payer: Self-pay

## 2023-11-07 ENCOUNTER — Encounter: Payer: Self-pay | Admitting: Internal Medicine

## 2023-11-07 ENCOUNTER — Telehealth (HOSPITAL_COMMUNITY): Payer: Self-pay | Admitting: Pharmacy Technician

## 2023-11-07 ENCOUNTER — Other Ambulatory Visit: Payer: Self-pay

## 2023-11-07 ENCOUNTER — Other Ambulatory Visit (HOSPITAL_COMMUNITY): Payer: Self-pay

## 2023-11-07 DIAGNOSIS — I10 Essential (primary) hypertension: Secondary | ICD-10-CM | POA: Diagnosis not present

## 2023-11-07 DIAGNOSIS — I5030 Unspecified diastolic (congestive) heart failure: Secondary | ICD-10-CM

## 2023-11-07 DIAGNOSIS — I5032 Chronic diastolic (congestive) heart failure: Secondary | ICD-10-CM

## 2023-11-07 DIAGNOSIS — R739 Hyperglycemia, unspecified: Secondary | ICD-10-CM | POA: Insufficient documentation

## 2023-11-07 DIAGNOSIS — I4891 Unspecified atrial fibrillation: Secondary | ICD-10-CM | POA: Diagnosis not present

## 2023-11-07 DIAGNOSIS — Z6841 Body Mass Index (BMI) 40.0 and over, adult: Secondary | ICD-10-CM

## 2023-11-07 DIAGNOSIS — G4733 Obstructive sleep apnea (adult) (pediatric): Secondary | ICD-10-CM | POA: Diagnosis not present

## 2023-11-07 LAB — MAGNESIUM: Magnesium: 1.9 mg/dL (ref 1.7–2.4)

## 2023-11-07 LAB — COMPREHENSIVE METABOLIC PANEL
ALT: 34 U/L (ref 0–44)
AST: 28 U/L (ref 15–41)
Albumin: 3.1 g/dL — ABNORMAL LOW (ref 3.5–5.0)
Alkaline Phosphatase: 53 U/L (ref 38–126)
Anion gap: 12 (ref 5–15)
BUN: 21 mg/dL — ABNORMAL HIGH (ref 6–20)
CO2: 26 mmol/L (ref 22–32)
Calcium: 8.6 mg/dL — ABNORMAL LOW (ref 8.9–10.3)
Chloride: 97 mmol/L — ABNORMAL LOW (ref 98–111)
Creatinine, Ser: 0.57 mg/dL — ABNORMAL LOW (ref 0.61–1.24)
GFR, Estimated: >60 mL/min (ref >60)
Glucose, Bld: 180 mg/dL — ABNORMAL HIGH (ref 70–99)
Potassium: 3.4 mmol/L — ABNORMAL LOW (ref 3.5–5.1)
Sodium: 135 mmol/L (ref 135–145)
Total Bilirubin: 0.6 mg/dL (ref 0.0–1.2)
Total Protein: 6.5 g/dL (ref 6.5–8.1)

## 2023-11-07 LAB — CBG MONITORING, ED
Glucose-Capillary: 146 mg/dL — ABNORMAL HIGH (ref 70–99)
Glucose-Capillary: 136 mg/dL — ABNORMAL HIGH (ref 70–99)
Glucose-Capillary: 116 mg/dL — ABNORMAL HIGH (ref 70–99)

## 2023-11-07 LAB — BRAIN NATRIURETIC PEPTIDE: B Natriuretic Peptide: 15.4 pg/mL (ref 0.0–100.0)

## 2023-11-07 LAB — CBC WITH DIFFERENTIAL/PLATELET
Abs Immature Granulocytes: 0.03 10*3/uL (ref 0.00–0.07)
Basophils Absolute: 0 10*3/uL (ref 0.0–0.1)
Basophils Relative: 0 %
Eosinophils Absolute: 0.2 10*3/uL (ref 0.0–0.5)
Eosinophils Relative: 2 %
HCT: 45.7 % (ref 39.0–52.0)
Hemoglobin: 14.9 g/dL (ref 13.0–17.0)
Immature Granulocytes: 0 %
Lymphocytes Relative: 26 %
Lymphs Abs: 2.6 10*3/uL (ref 0.7–4.0)
MCH: 28.1 pg (ref 26.0–34.0)
MCHC: 32.6 g/dL (ref 30.0–36.0)
MCV: 86.2 fL (ref 80.0–100.0)
Monocytes Absolute: 0.9 10*3/uL (ref 0.1–1.0)
Monocytes Relative: 9 %
Neutro Abs: 6.3 10*3/uL (ref 1.7–7.7)
Neutrophils Relative %: 63 %
Platelets: 347 10*3/uL (ref 150–400)
RBC: 5.3 MIL/uL (ref 4.22–5.81)
RDW: 13.7 % (ref 11.5–15.5)
WBC: 10.1 10*3/uL (ref 4.0–10.5)
nRBC: 0 % (ref 0.0–0.2)

## 2023-11-07 LAB — TSH: TSH: 2.346 u[IU]/mL (ref 0.350–4.500)

## 2023-11-07 LAB — TROPONIN I (HIGH SENSITIVITY)
Troponin I (High Sensitivity): 10 ng/L (ref <18)
Troponin I (High Sensitivity): 19 ng/L — ABNORMAL HIGH (ref <18)
Troponin I (High Sensitivity): 16 ng/L (ref <18)

## 2023-11-07 LAB — D-DIMER, QUANTITATIVE: D-Dimer, Quant: 0.51 ug{FEU}/mL — ABNORMAL HIGH (ref 0.00–0.50)

## 2023-11-07 LAB — T4, FREE: Free T4: 1.16 ng/dL — ABNORMAL HIGH (ref 0.61–1.12)

## 2023-11-07 MED ORDER — IOHEXOL 350 MG/ML SOLN
100.0000 mL | Freq: Once | INTRAVENOUS | Status: AC | PRN
  Administered 2023-11-07: 100 mL via INTRAVENOUS

## 2023-11-07 MED ORDER — ENOXAPARIN SODIUM 120 MG/0.8ML IJ SOSY: 0.5000 mg/kg | PREFILLED_SYRINGE | INTRAMUSCULAR | Status: DC

## 2023-11-07 MED ORDER — ONDANSETRON HCL 4 MG PO TABS: 4.0000 mg | ORAL_TABLET | Freq: Four times a day (QID) | ORAL | Status: DC | PRN

## 2023-11-07 MED ORDER — INSULIN ASPART 100 UNIT/ML IJ SOLN
0.0000 [IU] | Freq: Three times a day (TID) | INTRAMUSCULAR | Status: DC
  Administered 2023-11-07 (×2): 1 [IU] via SUBCUTANEOUS
  Filled 2023-11-07 (×2): qty 1

## 2023-11-07 MED ORDER — FUROSEMIDE 10 MG/ML IJ SOLN
40.0000 mg | Freq: Two times a day (BID) | INTRAMUSCULAR | Status: DC
  Administered 2023-11-07 – 2023-11-08 (×2): 40 mg via INTRAVENOUS
  Filled 2023-11-07 (×2): qty 4

## 2023-11-07 MED ORDER — DILTIAZEM HCL 25 MG/5ML IV SOLN
20.0000 mg | Freq: Once | INTRAVENOUS | Status: AC
  Administered 2023-11-07: 20 mg via INTRAVENOUS
  Filled 2023-11-07: qty 5

## 2023-11-07 MED ORDER — METOPROLOL TARTRATE 5 MG/5ML IV SOLN: 5.0000 mg | Freq: Once | INTRAVENOUS | Status: DC

## 2023-11-07 MED ORDER — POTASSIUM CHLORIDE CRYS ER 20 MEQ PO TBCR
40.0000 meq | EXTENDED_RELEASE_TABLET | Freq: Once | ORAL | Status: AC
  Administered 2023-11-07: 40 meq via ORAL
  Filled 2023-11-07: qty 2

## 2023-11-07 MED ORDER — DILTIAZEM HCL-DEXTROSE 125-5 MG/125ML-% IV SOLN (PREMIX)
5.0000 mg/h | INTRAVENOUS | Status: DC
  Administered 2023-11-07: 15 mg/h via INTRAVENOUS
  Administered 2023-11-07: 5 mg/h via INTRAVENOUS
  Administered 2023-11-07: 13 mg/h via INTRAVENOUS
  Filled 2023-11-07 (×3): qty 125

## 2023-11-07 MED ORDER — ONDANSETRON HCL 4 MG/2ML IJ SOLN: 4.0000 mg | Freq: Four times a day (QID) | INTRAMUSCULAR | Status: DC | PRN

## 2023-11-07 MED ORDER — APIXABAN 5 MG PO TABS
5.0000 mg | ORAL_TABLET | Freq: Two times a day (BID) | ORAL | Status: DC
  Administered 2023-11-07 – 2023-11-08 (×3): 5 mg via ORAL
  Filled 2023-11-07 (×3): qty 1

## 2023-11-07 MED ORDER — DAPAGLIFLOZIN PROPANEDIOL 10 MG PO TABS
10.0000 mg | ORAL_TABLET | Freq: Every day | ORAL | Status: DC
  Administered 2023-11-07 – 2023-11-08 (×2): 10 mg via ORAL
  Filled 2023-11-07 (×2): qty 1

## 2023-11-07 MED ORDER — ENOXAPARIN SODIUM 40 MG/0.4ML IJ SOSY: 40.0000 mg | PREFILLED_SYRINGE | INTRAMUSCULAR | Status: DC

## 2023-11-07 NOTE — ED Notes (Signed)
Report to receiving RN provided pt remains aox4 appears in nad denies further needs @ this time hx OSA O2 3L via [place] provided for resting

## 2023-11-07 NOTE — ED Notes (Signed)
Pt arrived to room 54 with HR on monitor 137-160.  Pt is sitting up and speaking with me.  IV started and medication given per order.  HR now after medication 110's-130's.  Will continue to monitor EDP notified

## 2023-11-07 NOTE — Consult Note (Signed)
Christopher Keith is a 38 y.o. male  [id number]  Primary Cardiologist: Adrian Blackwater Reason for Consultation: Atrial fibrillation with rapid ventricular response rate  HPI: This is a 38 year old Hispanic male with a past medical history of CHF sleep apnea and hypertension presented to the hospital with palpitation dizziness and shortness of breath.  He was found to be in atrial fibrillation with rapid ventricular response rate.  He was started on Cardizem drip and currently is feeling much better with palpitation and chest discomfort subsiding.  Patient is normally followed by heart failure clinic and has not been seen in my office.   Review of Systems: Has dyspnea on exertion with swelling of the legs.   Past Medical History:  Diagnosis Date   CHF (congestive heart failure) (HCC)    Hypertension    MRSA carrier    Obesity    Sleep apnea     (Not in a hospital admission)     enoxaparin (LOVENOX) injection  0.5 mg/kg Subcutaneous Q24H   insulin aspart  0-9 Units Subcutaneous TID WC    Infusions:  diltiazem (CARDIZEM) infusion 5 mg/hr (11/07/23 0128)    No Known Allergies  Social History   Socioeconomic History   Marital status: Married    Spouse name: Not on file   Number of children: Not on file   Years of education: Not on file   Highest education level: Not on file  Occupational History   Not on file  Tobacco Use   Smoking status: Former    Current packs/day: 0.00    Types: Cigarettes    Quit date: 08/06/2022    Years since quitting: 1.2   Smokeless tobacco: Former  Substance and Sexual Activity   Alcohol use: Yes   Drug use: Never   Sexual activity: Yes  Other Topics Concern   Not on file  Social History Narrative   Not on file   Social Drivers of Health   Financial Resource Strain: Not on file  Food Insecurity: No Food Insecurity (08/16/2022)   Hunger Vital Sign    Worried About Running Out of Food in the Last Year: Never true    Ran Out of Food in  the Last Year: Never true  Transportation Needs: No Transportation Needs (08/16/2022)   PRAPARE - Administrator, Civil Service (Medical): No    Lack of Transportation (Non-Medical): No  Physical Activity: Not on file  Stress: Not on file  Social Connections: Not on file  Intimate Partner Violence: Not At Risk (08/16/2022)   Humiliation, Afraid, Rape, and Kick questionnaire    Fear of Current or Ex-Partner: No    Emotionally Abused: No    Physically Abused: No    Sexually Abused: No    Family History  Problem Relation Age of Onset   Gastric cancer Mother    Alcohol abuse Father    Obesity Father    Diabetes Mellitus II Brother     PHYSICAL EXAM: Vitals:   11/07/23 0930 11/07/23 1000  BP: 116/83 121/87  Pulse: (!) 125 88  Resp: 18 16  Temp:    SpO2: 95% 98%    No intake or output data in the 24 hours ending 11/07/23 1014  General:  Well appearing. No respiratory difficulty HEENT: normal Neck: supple. no JVD. Carotids 2+ bilat; no bruits. No lymphadenopathy or thryomegaly appreciated. Cor: PMI nondisplaced. Regular rate & rhythm. No rubs, gallops or murmurs. Lungs: clear Abdomen: soft, nontender, nondistended. No hepatosplenomegaly. No  bruits or masses. Good bowel sounds. Extremities: no cyanosis, clubbing, rash, edema Neuro: alert & oriented x 3, cranial nerves grossly intact. moves all 4 extremities w/o difficulty. Affect pleasant.  ECG: Atrial fibrillation with rapid ventricular response rate nonspecific ST-T changes  Results for orders placed or performed during the hospital encounter of 11/06/23 (from the past 24 hours)  CBC with Differential     Status: None   Collection Time: 11/07/23 12:14 AM  Result Value Ref Range   WBC 10.1 4.0 - 10.5 K/uL   RBC 5.30 4.22 - 5.81 MIL/uL   Hemoglobin 14.9 13.0 - 17.0 g/dL   HCT 16.1 09.6 - 04.5 %   MCV 86.2 80.0 - 100.0 fL   MCH 28.1 26.0 - 34.0 pg   MCHC 32.6 30.0 - 36.0 g/dL   RDW 40.9 81.1 - 91.4 %    Platelets 347 150 - 400 K/uL   nRBC 0.0 0.0 - 0.2 %   Neutrophils Relative % 63 %   Neutro Abs 6.3 1.7 - 7.7 K/uL   Lymphocytes Relative 26 %   Lymphs Abs 2.6 0.7 - 4.0 K/uL   Monocytes Relative 9 %   Monocytes Absolute 0.9 0.1 - 1.0 K/uL   Eosinophils Relative 2 %   Eosinophils Absolute 0.2 0.0 - 0.5 K/uL   Basophils Relative 0 %   Basophils Absolute 0.0 0.0 - 0.1 K/uL   Immature Granulocytes 0 %   Abs Immature Granulocytes 0.03 0.00 - 0.07 K/uL  Troponin I (High Sensitivity)     Status: None   Collection Time: 11/07/23 12:14 AM  Result Value Ref Range   Troponin I (High Sensitivity) 10 <18 ng/L  D-dimer, quantitative     Status: Abnormal   Collection Time: 11/07/23 12:14 AM  Result Value Ref Range   D-Dimer, Quant 0.51 (H) 0.00 - 0.50 ug/mL-FEU  TSH     Status: None   Collection Time: 11/07/23 12:14 AM  Result Value Ref Range   TSH 2.346 0.350 - 4.500 uIU/mL  Brain natriuretic peptide     Status: None   Collection Time: 11/07/23 12:21 AM  Result Value Ref Range   B Natriuretic Peptide 15.4 0.0 - 100.0 pg/mL  Comprehensive metabolic panel     Status: Abnormal   Collection Time: 11/07/23  2:24 AM  Result Value Ref Range   Sodium 135 135 - 145 mmol/L   Potassium 3.4 (L) 3.5 - 5.1 mmol/L   Chloride 97 (L) 98 - 111 mmol/L   CO2 26 22 - 32 mmol/L   Glucose, Bld 180 (H) 70 - 99 mg/dL   BUN 21 (H) 6 - 20 mg/dL   Creatinine, Ser 7.82 (L) 0.61 - 1.24 mg/dL   Calcium 8.6 (L) 8.9 - 10.3 mg/dL   Total Protein 6.5 6.5 - 8.1 g/dL   Albumin 3.1 (L) 3.5 - 5.0 g/dL   AST 28 15 - 41 U/L   ALT 34 0 - 44 U/L   Alkaline Phosphatase 53 38 - 126 U/L   Total Bilirubin 0.6 0.0 - 1.2 mg/dL   GFR, Estimated >95 >62 mL/min   Anion gap 12 5 - 15  T4, free     Status: Abnormal   Collection Time: 11/07/23  2:24 AM  Result Value Ref Range   Free T4 1.16 (H) 0.61 - 1.12 ng/dL  Magnesium     Status: None   Collection Time: 11/07/23  2:24 AM  Result Value Ref Range   Magnesium 1.9 1.7 - 2.4  mg/dL  Troponin I (High Sensitivity)     Status: Abnormal   Collection Time: 11/07/23  2:24 AM  Result Value Ref Range   Troponin I (High Sensitivity) 19 (H) <18 ng/L  Troponin I (High Sensitivity)     Status: None   Collection Time: 11/07/23  5:57 AM  Result Value Ref Range   Troponin I (High Sensitivity) 16 <18 ng/L   CT Angio Chest PE W and/or Wo Contrast Result Date: 11/07/2023 CLINICAL DATA:  Positive D-dimer. Palpitations and shortness of breath. EXAM: CT ANGIOGRAPHY CHEST WITH CONTRAST TECHNIQUE: Multidetector CT imaging of the chest was performed using the standard protocol during bolus administration of intravenous contrast. Multiplanar CT image reconstructions and MIPs were obtained to evaluate the vascular anatomy. RADIATION DOSE REDUCTION: This exam was performed according to the departmental dose-optimization program which includes automated exposure control, adjustment of the mA and/or kV according to patient size and/or use of iterative reconstruction technique. CONTRAST:  OMNIPAQUE IOHEXOL 350 MG/ML SOLN COMPARISON:  CT angiogram chest 08/21/2022 FINDINGS: Cardiovascular: Satisfactory opacification of the pulmonary arteries to the segmental level. No evidence of pulmonary embolism. Normal heart size. No pericardial effusion. Mediastinum/Nodes: No enlarged mediastinal, hilar, or axillary lymph nodes. Thyroid gland, trachea, and esophagus demonstrate no significant findings. Lungs/Pleura: Again seen are areas of air trapping throughout both lungs. There is no lung consolidation, pleural effusion or pneumothorax. Trachea and central airways are patent. Upper Abdomen: No acute abnormality. Musculoskeletal: Degenerative changes affect the spine. Review of the MIP images confirms the above findings. IMPRESSION: 1. No evidence for pulmonary embolism. 2. Stable areas of air trapping throughout both lungs. Findings are nonspecific but can be seen in the setting of small airways disease.  Electronically Signed   By: Darliss Cheney M.D.   On: 11/07/2023 03:56   DG Chest Port 1 View Result Date: 11/07/2023 CLINICAL DATA:  Shortness of breath EXAM: PORTABLE CHEST 1 VIEW COMPARISON:  Chest x-ray 12/14/2022 FINDINGS: Heart is enlarged. There is no focal lung infiltrate, pleural effusion or pneumothorax. No definite fracture seen. IMPRESSION: Cardiomegaly. No acute pulmonary process. Electronically Signed   By: Darliss Cheney M.D.   On: 11/07/2023 00:40     ASSESSMENT AND PLAN: 1.  Atrial fibrillation with rapid ventricular response rate.  Continue Cardizem drip and advised starting the patient on Eliquis 5 mg p.o. twice daily for stroke prevention #2 history of HFpEF with prior echocardiogram in 2023 showing 60% ejection fraction.  Normally followed by heart failure clinic.  Patient does have swelling of the legs and advised starting the patient on Lasix 20 mg IV every 12 hours.  Also start the patient Farxiga 10 mg once a day.  Echocardiogram is pending. #3 hypokalemia advise giving 40 mill equivalent of KCl p.o./IV.  Thank you very much for referral.  [facility] will be covering me over the weekend.  Itali Mckendry [place] Flakes

## 2023-11-07 NOTE — ED Notes (Signed)
Writer RN confirms CCMD has pt on monitor

## 2023-11-07 NOTE — Progress Notes (Signed)
PHARMACIST - PHYSICIAN COMMUNICATION  CONCERNING:  Enoxaparin (Lovenox) for DVT Prophylaxis    RECOMMENDATION: Patient was prescribed enoxaprin 40mg  q24 hours for VTE prophylaxis.   Filed Weights   11/07/23 0713  Weight: (!) 216.4 kg (477 lb 1.2 oz)    Body mass index is 73.62 kg/m.  Estimated Creatinine Clearance: 220.3 mL/min (A) (by C-G formula based on SCr of 0.57 mg/dL (L)).   Based on [facility] policy patient is candidate for enoxaparin 0.5mg /kg TBW SQ every 24 hours based on BMI being >30.  DESCRIPTION: Pharmacy has adjusted enoxaparin dose per [facility] policy.  Patient is now receiving enoxaparin 108 mg every 24 hours    Foye Deer, PharmD Clinical Pharmacist  11/07/2023 7:50 AM

## 2023-11-07 NOTE — Assessment & Plan Note (Signed)
BMI 74 today

## 2023-11-07 NOTE — ED Notes (Signed)
Writer RN contacts pt family to update Christopher Keith [phone] remains aox4 appears in nad ambulates to restroom without apparent difficulty reports dyspnea upon return from restroom

## 2023-11-07 NOTE — ED Notes (Signed)
Pt PIV x2 are cdi patent secured not infiltrated @ this time 20g piv to rt hand infusing Cardizem 15mg /hr per protocol dose titrated from start time q30 minutes from 5mg /hr to 15mg /hr to attempt to control rate of atrial fibrillated HR

## 2023-11-07 NOTE — Assessment & Plan Note (Signed)
2D ECHO 08/2022 EF 65-70%  Appears euvolemic with w/ BMI 74  Body habitus confounder to volume status  Wt today 216.4 kg  Continue home regimen Follow up cardiology recommendations

## 2023-11-07 NOTE — ED Provider Notes (Incomplete)
   [facility] Provider Note    Event Date/Time   First MD Initiated Contact with Patient 11/06/23 2352     (approximate)   History   Palpitations and Shortness of Breath   HPI  Christopher Keith is a 38 year old male with history of HTN, sleep apnea, CHF       Physical Exam   Triage Vital Signs: ED Triage Vitals  Encounter Vitals Group     BP      Systolic BP Percentile      Diastolic BP Percentile      Pulse      Resp      Temp      Temp src      SpO2      Weight      Height      Head Circumference      Peak Flow      Pain Score      Pain Loc      Pain Education      Exclude from Growth Chart     Most recent vital signs: There were no vitals filed for this visit.   General: Awake, interactive *** CV:  Regular rate, good peripheral perfusion. *** Resp:  Unlabored respirations. *** Abd:  Nondistended. *** Neuro:  Symmetric facial movement, fluid speech***   ED Results / Procedures / Treatments   Labs (all labs ordered are listed, but only abnormal results are displayed) Labs Reviewed  CBC WITH DIFFERENTIAL/PLATELET  BRAIN NATRIURETIC PEPTIDE  COMPREHENSIVE METABOLIC PANEL  D-DIMER, QUANTITATIVE  MAGNESIUM  TSH  T4, FREE  TROPONIN I (HIGH SENSITIVITY)     EKG EKG independently reviewed interpreted by myself (ER attending) demonstrates:    RADIOLOGY Imaging independently reviewed and interpreted by myself demonstrates:    PROCEDURES:  Critical Care performed: {CriticalCareYesNo:19197::"Yes, see critical care procedure note(s)","No"}  Procedures   MEDICATIONS ORDERED IN ED: Medications  diltiazem (CARDIZEM) injection 20 mg (has no administration in time range)     IMPRESSION / MDM / ASSESSMENT AND PLAN / ED COURSE  I reviewed the triage vital signs and the nursing notes.  Differential diagnosis includes, but is not limited to, ***  Patient's presentation is most consistent with {EM COPA:27473}  ***       FINAL CLINICAL IMPRESSION(S) / ED DIAGNOSES   Final diagnoses:  None     Rx / DC Orders   ED Discharge Orders     None        Note:  This document was prepared using Dragon voice recognition software and may include unintentional dictation errors.

## 2023-11-07 NOTE — Assessment & Plan Note (Signed)
New onset atrial fibrillation w/ HR into 140s  Trop WNL Started on dilt gtt in ER  CHADSVASc Score around 2  Defer full dose AC to cardiology evaluation  2D ECHO  BMI 74 which is a major confounder  Discussed weight loss and diet/lifestyle modification

## 2023-11-07 NOTE — Assessment & Plan Note (Signed)
 BP stable Titrate home regimen

## 2023-11-07 NOTE — Assessment & Plan Note (Signed)
 Blood sugar 180s SSI  A1C

## 2023-11-07 NOTE — Telephone Encounter (Signed)
Patient Product/process development scientist completed.    The patient is insured through [employer]. Patient has [employer], may use a copay card, and/or apply for patient assistance if available.    Ran test claim for Eliquis 5 mg and the current 30 day co-pay is $545.30 due to a $7500 deductible.   This test claim was processed through [facility]- copay amounts may vary at other pharmacies due to pharmacy/plan contracts, or as the patient moves through the different stages of their insurance plan.     Roland Earl, CPHT Pharmacy Technician III Certified Patient Advocate [facility] Patient Advocate Team Direct Number: [phone]  Fax: [phone]

## 2023-11-07 NOTE — ED Provider Notes (Signed)
[facility] Provider Note    Event Date/Time   First MD Initiated Contact with Patient 11/06/23 2352     (approximate)   History   Palpitations and Shortness of Breath   HPI  Christopher Keith is a 38 year old male with history of HTN, sleep apnea, CHF presenting to the ER for palpitations.  About 30 minutes prior to presentation, patient was laying down when he had onset of palpitations with discomfort under his left breast described as a pressure sensation and numbness in his arm.  Denies history of similar.  Currently feels some mild ongoing palpitations, but is improved from initial episode.    Physical Exam   Triage Vital Signs: ED Triage Vitals  Encounter Vitals Group     BP      Systolic BP Percentile      Diastolic BP Percentile      Pulse      Resp      Temp      Temp src      SpO2      Weight      Height      Head Circumference      Peak Flow      Pain Score      Pain Loc      Pain Education      Exclude from Growth Chart     Most recent vital signs: Vitals:   11/07/23 0400 11/07/23 0557  BP: 124/79   Pulse: 77   Resp: 19   Temp:  98.3 F (36.8 C)  SpO2: 94%      General: Awake, interactive  CV:  Tachycardic with irregularly irregular rhythm with heart rates in the 160s Resp:  Unlabored respirations, lungs clear to auscultation Abd:  Nondistended, soft, nontender to palpation Neuro:  Symmetric facial movement, fluid speech   ED Results / Procedures / Treatments   Labs (all labs ordered are listed, but only abnormal results are displayed) Labs Reviewed  D-DIMER, QUANTITATIVE - Abnormal; Notable for the following components:      Result Value   D-Dimer, Quant 0.51 (*)    All other components within normal limits  COMPREHENSIVE METABOLIC PANEL - Abnormal; Notable for the following components:   Potassium 3.4 (*)    Chloride 97 (*)    Glucose, Bld 180 (*)    BUN 21 (*)    Creatinine, Ser 0.57 (*)    Calcium 8.6 (*)     Albumin 3.1 (*)    All other components within normal limits  T4, FREE - Abnormal; Notable for the following components:   Free T4 1.16 (*)    All other components within normal limits  TROPONIN I (HIGH SENSITIVITY) - Abnormal; Notable for the following components:   Troponin I (High Sensitivity) 19 (*)    All other components within normal limits  CBC WITH DIFFERENTIAL/PLATELET  BRAIN NATRIURETIC PEPTIDE  TSH  MAGNESIUM  TROPONIN I (HIGH SENSITIVITY)  TROPONIN I (HIGH SENSITIVITY)     EKG EKG independently reviewed interpreted by myself (ER attending) demonstrates:  EKG demonstrates A-fib at a rate of 139, QRS 96, QTc 493  RADIOLOGY Imaging independently reviewed and interpreted by myself demonstrates:  CXR without focal consolidation CTA chest without PE  PROCEDURES:  Critical Care performed: Yes, see critical care procedure note(s)  CRITICAL CARE Performed by: Trinna Post   Total critical care time: 32 minutes  Critical care time was exclusive of separately billable procedures and treating other patients.  Critical care was necessary to treat or prevent imminent or life-threatening deterioration.  Critical care was time spent personally by me on the following activities: development of treatment plan with patient and/or surrogate as well as nursing, discussions with consultants, evaluation of patient's response to treatment, examination of patient, obtaining history from patient or surrogate, ordering and performing treatments and interventions, ordering and review of laboratory studies, ordering and review of radiographic studies, pulse oximetry and re-evaluation of patient's condition.   Procedures   MEDICATIONS ORDERED IN ED: Medications  diltiazem (CARDIZEM) 125 mg in dextrose 5% 125 mL (1 mg/mL) infusion (5 mg/hr Intravenous New Bag/Given 11/07/23 0128)  diltiazem (CARDIZEM) injection 20 mg (20 mg Intravenous Given 11/07/23 0019)  iohexol (OMNIPAQUE) 350 MG/ML  injection 100 mL (100 mLs Intravenous Contrast Given 11/07/23 0333)  potassium chloride SA (KLOR-CON M) CR tablet 40 mEq (40 mEq Oral Given 11/07/23 0545)     IMPRESSION / MDM / ASSESSMENT AND PLAN / ED COURSE  I reviewed the triage vital signs and the nursing notes.  Differential diagnosis includes, but is not limited to, ACS, arrhythmia, anemia, electrolyte abnormality, thyroid dysfunction  Patient's presentation is most consistent with acute presentation with potential threat to life or bodily function.  38 year old male presenting with palpitations found to be in new onset A-fib with RVR.  Patient ordered for adult bolus with improvement in heart rates to the 120s, placed on diltiazem drip. CXR without acute findings.  Labs with slightly low potassium.  D-dimer slightly elevated at 0.51.  CTA without PE.  Initial troponin 10, but obtain shortly after onset of symptoms. Repeat slightly uptrending to 19, possibly related to demand, but also consideration for ACS with recent onset of chest pressure and left arm discomfort.  Will send for repeat troponin.  Patient agreeable plan for admission.  Will reach out to hospitalist team.  Case reviewed with hospitalist team.  They will evaluate the patient for anticipated admission.  FINAL CLINICAL IMPRESSION(S) / ED DIAGNOSES   Final diagnoses:  Atrial fibrillation with rapid ventricular response (HCC)  Chest pain, unspecified type     Rx / DC Orders   ED Discharge Orders     None        Note:  This document was prepared using Dragon voice recognition software and may include unintentional dictation errors.   Trinna Post, MD 11/07/23 [phone]

## 2023-11-07 NOTE — Consult Note (Signed)
Pharmacy Consult Note - Anticoagulation  Pharmacy Consult for apixaban Indication: atrial fibrillation  PATIENT MEASUREMENTS: Height: 5' 7.5" (171.5 cm) Weight: (!) 216.4 kg (477 lb 1.2 oz) IBW/kg (Calculated) : 67.25 HEPARIN DW (KG): 123.8  VITAL SIGNS: Temp: 98.3 F (36.8 C) (01/31 0557) Temp Source: Oral (01/31 0557) BP: 134/83 (01/31 1030) Pulse Rate: 139 (01/31 1030)  Recent Labs    11/07/23 0014 11/07/23 0224 11/07/23 0557  HGB 14.9  --   --   HCT 45.7  --   --   PLT 347  --   --   CREATININE  --  0.57*  --   TROPONINIHS 10 19* 16    Estimated Creatinine Clearance: 220.3 mL/min (A) (by C-G formula based on SCr of 0.57 mg/dL (L)).  PAST MEDICAL HISTORY: Past Medical History:  Diagnosis Date   CHF (congestive heart failure) (HCC)    Hypertension    MRSA carrier    Obesity    Sleep apnea     ASSESSMENT: 38 y.o. male with PMH including CHF, OSA, HTN is presenting with symptomatic Afib w/RVR. Patient is not on chronic anticoagulation per chart review. CHA2DS2VASc is 2 (CHF, HTN). H&H is stable and WNL. Pharmacy has been consulted to initiate and manage heparin intravenous infusion.   Baseline anticoagulation labs: Recent Labs    11/07/23 0014  HGB 14.9  PLT 347    PLAN: Initiate apixaban 5 mg PO twice daily Continue to monitor for signs and symptoms of bleeding Check CBC at least every 7 days for patients taking DOACs   Will M. Dareen Piano, PharmD Clinical Pharmacist 11/07/2023 11:00 AM

## 2023-11-07 NOTE — H&P (Signed)
History and Physical    Patient: Christopher Keith NWG:[id number] DOB: [birth year] DOA: 11/06/2023 DOS: the patient was seen and examined on 11/07/2023 PCP: Pcp, No  Patient coming from: Home  Chief Complaint:  Chief Complaint  Patient presents with   Palpitations   Shortness of Breath   HPI: Christopher Keith is a 38 y.o. male with medical history significant of morbid obesity, HFpEF, hypertension, sleep apnea presenting with A-fib with RVR.  Patient reports sudden onset of palpitations over the course of the night.  Mild chest pressure.  No orthopnea or PND.  No abdominal pain.  Chronic lower extremity swelling in the setting of venous stasis changes.  No focal hemiparesis or confusion.  No reported excessive caffeine use or illicit drug use. Presented to the ER afebrile, heart rate into the 140s, BP stable, satting well on room air. White count 10.1, hemoglobin 15, platelets 347, troponin 19-16.  Creatinine 0.57, glucose 180, D-dimer 0.51.  TSH within normal limits.  CTA of the chest negative for PE.  Grossly stable. Review of Systems: As mentioned in the history of present illness. All other systems reviewed and are negative. Past Medical History:  Diagnosis Date   CHF (congestive heart failure) (HCC)    Hypertension    MRSA carrier    Obesity    Sleep apnea    Past Surgical History:  Procedure Laterality Date   ADENOIDECTOMY     IRRIGATION AND DEBRIDEMENT ABSCESS Right 02/23/2019   Procedure: IRRIGATION AND DEBRIDEMENT PERI RECTAL ABSCESS;  Surgeon: Emelia Loron, MD;  Location: [facility] OR;  Service: General;  Laterality: Right;   TONSILLECTOMY     Social History:  reports that he quit smoking about 15 months ago. His smoking use included cigarettes. He has quit using smokeless tobacco. He reports current alcohol use. He reports that he does not use drugs.  No Known Allergies  Family History  Problem Relation Age of Onset   Gastric cancer Mother    Alcohol abuse Father    Obesity Father     Diabetes Mellitus II Brother     Prior to Admission medications   Medication Sig Start Date End Date Taking? Authorizing Provider  albuterol (VENTOLIN HFA) 108 (90 Base) MCG/ACT inhaler Inhale 2 puffs into the lungs every 6 (six) hours as needed for wheezing or shortness of breath. 10/09/22  Yes Chesley Noon, MD  amLODipine (NORVASC) 10 MG tablet Take 1 tablet (10 mg total) by mouth daily. 08/18/23  Yes Clarisa Kindred A, FNP  dapagliflozin propanediol (FARXIGA) 10 MG TABS tablet Take 1 tablet (10 mg total) by mouth daily before breakfast. 08/18/23  Yes Clarisa Kindred A, FNP  lisinopril (ZESTRIL) 20 MG tablet Take 1 tablet (20 mg total) by mouth daily. 08/18/23  Yes Hackney, Inetta Fermo A, FNP  potassium chloride SA (KLOR-CON M) 20 MEQ tablet Take 1 tablet (20 mEq total) by mouth daily. 04/11/23  Yes Clarisa Kindred A, FNP  torsemide (DEMADEX) 20 MG tablet Take 2 tablets (40 mg total) by mouth 2 (two) times daily. 08/15/23  Yes Delma Freeze, FNP  Spacer/Aero-Hold Chamber Bags MISC 1 Units by Does not apply route once for 1 dose. 08/21/22   Pilar Jarvis, MD    Physical Exam: Vitals:   11/07/23 5784 11/07/23 0656 11/07/23 0657 11/07/23 0713  BP:      Pulse: (!) 59 83 79   Resp: 14 17 15    Temp:      TempSrc:      SpO2: 96% 96% 97%  Weight:    (!) 216.4 kg  Height:    5' 7.5" (1.715 m)   Physical Exam Constitutional:      Appearance: He is obese.  HENT:     Head: Normocephalic and atraumatic.     Nose: Nose normal.     Mouth/Throat:     Mouth: Mucous membranes are moist.  Eyes:     Pupils: Pupils are equal, round, and reactive to light.  Cardiovascular:     Rate and Rhythm: Normal rate and regular rhythm.  Pulmonary:     Effort: Pulmonary effort is normal.  Abdominal:     General: Bowel sounds are normal.  Musculoskeletal:     Right lower leg: Edema present.     Left lower leg: Edema present.  Skin:    General: Skin is warm.  Neurological:     General: No focal deficit present.   Psychiatric:        Mood and Affect: Mood normal.     Data Reviewed:  There are no new results to review at this time.  CT Angio Chest PE W and/or Wo Contrast CLINICAL DATA:  Positive D-dimer. Palpitations and shortness of breath.  EXAM: CT ANGIOGRAPHY CHEST WITH CONTRAST  TECHNIQUE: Multidetector CT imaging of the chest was performed using the standard protocol during bolus administration of intravenous contrast. Multiplanar CT image reconstructions and MIPs were obtained to evaluate the vascular anatomy.  RADIATION DOSE REDUCTION: This exam was performed according to the departmental dose-optimization program which includes automated exposure control, adjustment of the mA and/or kV according to patient size and/or use of iterative reconstruction technique.  CONTRAST:  OMNIPAQUE IOHEXOL 350 MG/ML SOLN  COMPARISON:  CT angiogram chest 08/21/2022  FINDINGS: Cardiovascular: Satisfactory opacification of the pulmonary arteries to the segmental level. No evidence of pulmonary embolism. Normal heart size. No pericardial effusion.  Mediastinum/Nodes: No enlarged mediastinal, hilar, or axillary lymph nodes. Thyroid gland, trachea, and esophagus demonstrate no significant findings.  Lungs/Pleura: Again seen are areas of air trapping throughout both lungs. There is no lung consolidation, pleural effusion or pneumothorax. Trachea and central airways are patent.  Upper Abdomen: No acute abnormality.  Musculoskeletal: Degenerative changes affect the spine.  Review of the MIP images confirms the above findings.  IMPRESSION: 1. No evidence for pulmonary embolism. 2. Stable areas of air trapping throughout both lungs. Findings are nonspecific but can be seen in the setting of small airways disease.  Electronically Signed   By: Darliss Cheney M.D.   On: 11/07/2023 03:56 DG Chest Port 1 View CLINICAL DATA:  Shortness of breath  EXAM: PORTABLE CHEST 1  VIEW  COMPARISON:  Chest x-ray 12/14/2022  FINDINGS: Heart is enlarged. There is no focal lung infiltrate, pleural effusion or pneumothorax. No definite fracture seen.  IMPRESSION: Cardiomegaly. No acute pulmonary process.  Electronically Signed   By: Darliss Cheney M.D.   On: 11/07/2023 00:40  Lab Results  Component Value Date   WBC 10.1 11/07/2023   HGB 14.9 11/07/2023   HCT 45.7 11/07/2023   MCV 86.2 11/07/2023   PLT 347 11/07/2023   Last metabolic panel Lab Results  Component Value Date   GLUCOSE 180 (H) 11/07/2023   NA 135 11/07/2023   K 3.4 (L) 11/07/2023   CL 97 (L) 11/07/2023   CO2 26 11/07/2023   BUN 21 (H) 11/07/2023   CREATININE 0.57 (L) 11/07/2023   GFRNONAA >60 11/07/2023   CALCIUM 8.6 (L) 11/07/2023   PROT 6.5 11/07/2023   ALBUMIN  3.1 (L) 11/07/2023   BILITOT 0.6 11/07/2023   ALKPHOS 53 11/07/2023   AST 28 11/07/2023   ALT 34 11/07/2023   ANIONGAP 12 11/07/2023    Assessment and Plan: * Atrial fibrillation with rapid ventricular response (HCC) New onset atrial fibrillation w/ HR into 140s  Trop WNL Started on dilt gtt in ER  CHADSVASc Score around 2  Defer full dose AC to cardiology evaluation  2D ECHO  BMI 74 which is a major confounder  Discussed weight loss and diet/lifestyle modification    Chronic heart failure with preserved ejection fraction (HFpEF) (HCC) 2D ECHO 08/2022 EF 65-70%  Appears euvolemic with w/ BMI 74  Body habitus confounder to volume status  Wt today 216.4 kg  Continue home regimen Follow up cardiology recommendations    Morbid obesity with BMI of 60.0-69.9, adult (HCC) BMI 74 today    Hypertension BP stable  Titrate home regimen        Advance Care Planning:   Code Status: Full Code   Consults: Cardiology   Family Communication: No family at the bedside   Severity of Illness: The appropriate patient status for this patient is INPATIENT. Inpatient status is judged to be reasonable and necessary in  order to provide the required intensity of service to ensure the patient's safety. The patient's presenting symptoms, physical exam findings, and initial radiographic and laboratory data in the context of their chronic comorbidities is felt to place them at high risk for further clinical deterioration. Furthermore, it is not anticipated that the patient will be medically stable for discharge from the hospital within 2 midnights of admission.   * I certify that at the point of admission it is my clinical judgment that the patient will require inpatient hospital care spanning beyond 2 midnights from the point of admission due to high intensity of service, high risk for further deterioration and high frequency of surveillance required.*  Author: Floydene Flock, MD 11/07/2023 7:54 AM  For on call review [email or web address].

## 2023-11-08 DIAGNOSIS — Z79899 Other long term (current) drug therapy: Secondary | ICD-10-CM | POA: Diagnosis not present

## 2023-11-08 DIAGNOSIS — I110 Hypertensive heart disease with heart failure: Secondary | ICD-10-CM | POA: Diagnosis not present

## 2023-11-08 DIAGNOSIS — R002 Palpitations: Secondary | ICD-10-CM | POA: Diagnosis present

## 2023-11-08 DIAGNOSIS — I5032 Chronic diastolic (congestive) heart failure: Secondary | ICD-10-CM | POA: Diagnosis not present

## 2023-11-08 DIAGNOSIS — Z87891 Personal history of nicotine dependence: Secondary | ICD-10-CM | POA: Diagnosis not present

## 2023-11-08 DIAGNOSIS — I4891 Unspecified atrial fibrillation: Secondary | ICD-10-CM

## 2023-11-08 DIAGNOSIS — Z6841 Body Mass Index (BMI) 40.0 and over, adult: Secondary | ICD-10-CM | POA: Diagnosis not present

## 2023-11-08 DIAGNOSIS — Z7901 Long term (current) use of anticoagulants: Secondary | ICD-10-CM | POA: Diagnosis not present

## 2023-11-08 LAB — HEMOGLOBIN A1C
Hgb A1c MFr Bld: 6.3 % — ABNORMAL HIGH (ref 4.8–5.6)
Mean Plasma Glucose: 134.11 mg/dL

## 2023-11-08 LAB — CBC
HCT: 43.3 % (ref 39.0–52.0)
Hemoglobin: 14.1 g/dL (ref 13.0–17.0)
MCH: 28.1 pg (ref 26.0–34.0)
MCHC: 32.6 g/dL (ref 30.0–36.0)
MCV: 86.3 fL (ref 80.0–100.0)
Platelets: 314 10*3/uL (ref 150–400)
RBC: 5.02 MIL/uL (ref 4.22–5.81)
RDW: 13.8 % (ref 11.5–15.5)
WBC: 8.1 10*3/uL (ref 4.0–10.5)
nRBC: 0 % (ref 0.0–0.2)

## 2023-11-08 LAB — COMPREHENSIVE METABOLIC PANEL
ALT: 34 U/L (ref 0–44)
AST: 24 U/L (ref 15–41)
Albumin: 3.3 g/dL — ABNORMAL LOW (ref 3.5–5.0)
Alkaline Phosphatase: 56 U/L (ref 38–126)
Anion gap: 9 (ref 5–15)
BUN: 16 mg/dL (ref 6–20)
CO2: 27 mmol/L (ref 22–32)
Calcium: 8.7 mg/dL — ABNORMAL LOW (ref 8.9–10.3)
Chloride: 100 mmol/L (ref 98–111)
Creatinine, Ser: 0.62 mg/dL (ref 0.61–1.24)
GFR, Estimated: >60 mL/min (ref >60)
Glucose, Bld: 133 mg/dL — ABNORMAL HIGH (ref 70–99)
Potassium: 3.8 mmol/L (ref 3.5–5.1)
Sodium: 136 mmol/L (ref 135–145)
Total Bilirubin: 0.9 mg/dL (ref 0.0–1.2)
Total Protein: 7.1 g/dL (ref 6.5–8.1)

## 2023-11-08 LAB — HIV ANTIBODY (ROUTINE TESTING W REFLEX): HIV Screen 4th Generation wRfx: NONREACTIVE

## 2023-11-08 LAB — CBG MONITORING, ED: Glucose-Capillary: 120 mg/dL — ABNORMAL HIGH (ref 70–99)

## 2023-11-08 MED ORDER — APIXABAN 5 MG PO TABS: 5.0000 mg | ORAL_TABLET | Freq: Two times a day (BID) | ORAL | 3 refills | Status: AC

## 2023-11-08 NOTE — ED Notes (Signed)
Secure chat to Jon Billings, NP: This Pt is a 38yo male with hx of morbid obesity, HFpEF, hypertension, sleep apnea presenting with A-fib with RVR. He has been on a dilt drip since before we came in today. While sleeping pts hr remained mid 60s until the last hour or hour or so, I have been titrating the drip per the [facility]. I just turned off the drip bc we were down to 2.5mg /hr and his HR dropped to 56. I also noticed he has at some time converted to SR, I am not sure exactly when that happened. I have repeated his EKG, should be in the chart. Pt has no complaints easily arouses to verbal stimuli. Pt has no complaints at this time. HR 62, 137/83, 16RR, 96% 2.5L [place]. Any new orders?

## 2023-11-08 NOTE — Progress Notes (Signed)
       CROSS COVER NOTE  NAME: Christopher Keith MRN: [id number] DOB : 10/23/1985 ATTENDING PHYSICIAN: Andris Baumann, MD    Date of Service   11/08/2023   HPI/Events of Note   Message received from nurse Gershon Crane. This Pt is a 38yo male with hx of morbid obesity, HFpEF, hypertension, sleep apnea presenting with A-fib with RVR. He has been on a dilt drip since before we came in today. While sleeping pts hr remained mid 60s until the last hour or hour or so, I have been titrating the drip per the [facility]. I just turned off the drip bc we were down to 2.5mg /hr and his HR dropped to 56. I also noticed he has at some time converted to SR, I am not sure exactly when that happened. I have repeated his EKG, should be in the chart. Pt has no complaints easily arouses to verbal stimuli. Pt has no complaints at this time. HR 62, 137/83, 16RR, 96% 2.5L [place]. Any new orders?   Interventions   Assessment/Plan: EKG - SR 62 RBBB and LAFB (not new) Keep cardizem off  Notify provider for rhythm change or heart rate above 125        Donnie Mesa NP [facility] Cross Cover 7pm-7am - check amion for availability Pager [phone]

## 2023-11-08 NOTE — ED Notes (Signed)
Patient has breakfast tray.

## 2023-11-08 NOTE — Progress Notes (Signed)
[facility] Cardiology  SUBJECTIVE: Patient sitting on side of the bed, reports doing well, denies chest pain or shortness of breath   Vitals:   11/08/23 0630 11/08/23 0730 11/08/23 0800 11/08/23 0900  BP: 127/74 117/64 110/87 124/79  Pulse: 64 (!) 54 64 61  Resp: 14 15 15 15   Temp: 98.2 F (36.8 C)     TempSrc: Oral     SpO2: 98% 98% 98% 96%  Weight:      Height:         Intake/Output Summary (Last 24 hours) at 11/08/2023 1054 Last data filed at 11/07/2023 2150 Gross per 24 hour  Intake --  Output 675 ml  Net -675 ml      PHYSICAL EXAM  General: Well developed, well nourished, in no acute distress HEENT:  Normocephalic and atramatic Neck:  No JVD.  Lungs: Clear bilaterally to auscultation and percussion. Heart: HRRR . Normal S1 and S2 without gallops or murmurs.  Abdomen: Bowel sounds are positive, abdomen soft and non-tender  Msk:  Back normal, normal gait. Normal strength and tone for age. Extremities: No clubbing, cyanosis or edema.   Neuro: Alert and oriented X 3. Psych:  Good affect, responds appropriately   LABS: Basic Metabolic Panel: Recent Labs    11/07/23 0224 11/08/23 0607  NA 135 136  K 3.4* 3.8  CL 97* 100  CO2 26 27  GLUCOSE 180* 133*  BUN 21* 16  CREATININE 0.57* 0.62  CALCIUM 8.6* 8.7*  MG 1.9  --    Liver Function Tests: Recent Labs    11/07/23 0224 11/08/23 0607  AST 28 24  ALT 34 34  ALKPHOS 53 56  BILITOT 0.6 0.9  PROT 6.5 7.1  ALBUMIN 3.1* 3.3*   No results for input(s): "LIPASE", "AMYLASE" in the last 72 hours. CBC: Recent Labs    11/07/23 0014 11/08/23 0607  WBC 10.1 8.1  NEUTROABS 6.3  --   HGB 14.9 14.1  HCT 45.7 43.3  MCV 86.2 86.3  PLT 347 314   Cardiac Enzymes: No results for input(s): "CKTOTAL", "CKMB", "CKMBINDEX", "TROPONINI" in the last 72 hours. BNP: Invalid input(s): "POCBNP" D-Dimer: Recent Labs    11/07/23 0014  DDIMER 0.51*   Hemoglobin A1C: No results for input(s): "HGBA1C" in the last 72  hours. Fasting Lipid Panel: No results for input(s): "CHOL", "HDL", "LDLCALC", "TRIG", "CHOLHDL", "LDLDIRECT" in the last 72 hours. Thyroid Function Tests: Recent Labs    11/07/23 0014  TSH 2.346   Anemia Panel: No results for input(s): "VITAMINB12", "FOLATE", "FERRITIN", "TIBC", "IRON", "RETICCTPCT" in the last 72 hours.  CT Angio Chest PE W and/or Wo Contrast Result Date: 11/07/2023 CLINICAL DATA:  Positive D-dimer. Palpitations and shortness of breath. EXAM: CT ANGIOGRAPHY CHEST WITH CONTRAST TECHNIQUE: Multidetector CT imaging of the chest was performed using the standard protocol during bolus administration of intravenous contrast. Multiplanar CT image reconstructions and MIPs were obtained to evaluate the vascular anatomy. RADIATION DOSE REDUCTION: This exam was performed according to the departmental dose-optimization program which includes automated exposure control, adjustment of the mA and/or kV according to patient size and/or use of iterative reconstruction technique. CONTRAST:  OMNIPAQUE IOHEXOL 350 MG/ML SOLN COMPARISON:  CT angiogram chest 08/21/2022 FINDINGS: Cardiovascular: Satisfactory opacification of the pulmonary arteries to the segmental level. No evidence of pulmonary embolism. Normal heart size. No pericardial effusion. Mediastinum/Nodes: No enlarged mediastinal, hilar, or axillary lymph nodes. Thyroid gland, trachea, and esophagus demonstrate no significant findings. Lungs/Pleura: Again seen are areas of air  trapping throughout both lungs. There is no lung consolidation, pleural effusion or pneumothorax. Trachea and central airways are patent. Upper Abdomen: No acute abnormality. Musculoskeletal: Degenerative changes affect the spine. Review of the MIP images confirms the above findings. IMPRESSION: 1. No evidence for pulmonary embolism. 2. Stable areas of air trapping throughout both lungs. Findings are nonspecific but can be seen in the setting of small airways disease.  Electronically Signed   By: Darliss Cheney M.D.   On: 11/07/2023 03:56   DG Chest Port 1 View Result Date: 11/07/2023 CLINICAL DATA:  Shortness of breath EXAM: PORTABLE CHEST 1 VIEW COMPARISON:  Chest x-ray 12/14/2022 FINDINGS: Heart is enlarged. There is no focal lung infiltrate, pleural effusion or pneumothorax. No definite fracture seen. IMPRESSION: Cardiomegaly. No acute pulmonary process. Electronically Signed   By: Darliss Cheney M.D.   On: 11/07/2023 00:40     Echo LVEF 65-70% 08/14/2022  TELEMETRY: Sinus rhythm 65 bpm:  ASSESSMENT AND PLAN:  Principal Problem:   Atrial fibrillation with rapid ventricular response (HCC) Active Problems:   Hypertension   Morbid obesity with BMI of 60.0-69.9, adult (HCC)   Chronic heart failure with preserved ejection fraction (HFpEF) (HCC)   Hyperglycemia    1.  Atrial fibrillation with RVR, new onset, converted to sinus rhythm on IV Cardizem, started on Eliquis for stroke prevention.  Initial ECG revealed atrial fibrillation at a rate of 139 with underlying incomplete right branch block.  Most recent ECG reveals sinus rhythm at 62 bpm. 2.  Chronic HFpEF, BNP 10.4) LVEF 65-70% 08/14/2022, followed by Clarisa Kindred in congestive heart failure clinic, on good medical management (lisinopril, amlodipine, torsemide)  Recommendations  1.  Agree with current therapy 2.  Continue Eliquis for stroke prevention 3.  Resume home antihypertensive medications 4.  Follow-up Dr. Welton Flakes in 1 to 2 weeks   Marcina Millard, MD, PhD, [facility] 11/08/2023 10:54 AM

## 2023-11-08 NOTE — Discharge Summary (Signed)
Physician Discharge Summary   Patient: Christopher Keith MRN: [id number] DOB: 1986-05-05  Admit date:     11/06/2023  Discharge date: 11/08/23  Discharge Physician: Loyce Dys   PCP: Pcp, No   Recommendations at discharge:  Follow-up with cardiology  Discharge Diagnoses: Atrial fibrillation with rapid ventricular response (HCC) Chronic heart failure with preserved ejection fraction (HFpEF) (HCC) Morbid obesity with BMI of 60.0-69.9, adult [facility]) Hypertension  Hospital Course: Christopher Keith is a 38 y.o. male with medical history significant of morbid obesity, HFpEF, hypertension, sleep apnea presenting with A-fib with RVR.  Patient reports sudden onset of palpitations over the course of the night.  Mild chest pressure.  No orthopnea or PND.  No abdominal pain.  Chronic lower extremity swelling in the setting of venous stasis changes.  No focal hemiparesis or confusion.  No reported excessive caffeine use or illicit drug use. Presented to the ER afebrile, heart rate into the 140s, BP stable, satting well on room air. White count 10.1, hemoglobin 15, platelets 347, troponin 19-16.  Creatinine 0.57, glucose 180, D-dimer 0.51.  TSH within normal limits.  CTA of the chest negative for PE.  Grossly stable.  Patient was seen by cardiologist.  Cardiorespiratory function improved and he feels back to his normal self.  Was counseled on need for Eliquis by cardiologist and has agreed.  Patient is therefore being discharged today to follow-up with cardiologist   Consultants: Cardiology Procedures performed: None Disposition: Home Diet recommendation:  Cardiac diet DISCHARGE MEDICATION: Allergies as of 11/08/2023   No Known Allergies      Medication List     TAKE these medications    albuterol 108 (90 Base) MCG/ACT inhaler Commonly known as: VENTOLIN HFA Inhale 2 puffs into the lungs every 6 (six) hours as needed for wheezing or shortness of breath.   amLODipine 10 MG tablet Commonly known as:  NORVASC Take 1 tablet (10 mg total) by mouth daily.   apixaban 5 MG Tabs tablet Commonly known as: ELIQUIS Take 1 tablet (5 mg total) by mouth 2 (two) times daily.   dapagliflozin propanediol 10 MG Tabs tablet Commonly known as: Farxiga Take 1 tablet (10 mg total) by mouth daily before breakfast.   lisinopril 20 MG tablet Commonly known as: ZESTRIL Take 1 tablet (20 mg total) by mouth daily.   potassium chloride SA 20 MEQ tablet Commonly known as: KLOR-CON M Take 1 tablet (20 mEq total) by mouth daily.   Spacer/Aero-Hold Chamber Bags Misc 1 Units by Does not apply route once for 1 dose.   torsemide 20 MG tablet Commonly known as: DEMADEX Take 2 tablets (40 mg total) by mouth 2 (two) times daily.        Discharge Exam: Filed Weights   11/07/23 0713  Weight: (!) 216.4 kg    Appearance: He is obese.  HENT:     Head: Normocephalic and atraumatic.     Nose: Nose normal.     Mouth/Throat:     Mouth: Mucous membranes are moist.  Eyes:     Pupils: Pupils are equal, round, and reactive to light.  Cardiovascular:     Rate and Rhythm: Normal rate and regular rhythm.  Pulmonary:     Effort: Pulmonary effort is normal.  Abdominal:     General: Bowel sounds are normal.  Musculoskeletal: Lower extremity edema improved Skin:    General: Skin is warm.  Neurological:     General: No focal deficit present.  Psychiatric:  Mood and Affect: Mood normal.   Condition at discharge: good  The results of significant diagnostics from this hospitalization (including imaging, microbiology, ancillary and laboratory) are listed below for reference.   Imaging Studies: CT Angio Chest PE W and/or Wo Contrast Result Date: 11/07/2023 CLINICAL DATA:  Positive D-dimer. Palpitations and shortness of breath. EXAM: CT ANGIOGRAPHY CHEST WITH CONTRAST TECHNIQUE: Multidetector CT imaging of the chest was performed using the standard protocol during bolus administration of intravenous  contrast. Multiplanar CT image reconstructions and MIPs were obtained to evaluate the vascular anatomy. RADIATION DOSE REDUCTION: This exam was performed according to the departmental dose-optimization program which includes automated exposure control, adjustment of the mA and/or kV according to patient size and/or use of iterative reconstruction technique. CONTRAST:  OMNIPAQUE IOHEXOL 350 MG/ML SOLN COMPARISON:  CT angiogram chest 08/21/2022 FINDINGS: Cardiovascular: Satisfactory opacification of the pulmonary arteries to the segmental level. No evidence of pulmonary embolism. Normal heart size. No pericardial effusion. Mediastinum/Nodes: No enlarged mediastinal, hilar, or axillary lymph nodes. Thyroid gland, trachea, and esophagus demonstrate no significant findings. Lungs/Pleura: Again seen are areas of air trapping throughout both lungs. There is no lung consolidation, pleural effusion or pneumothorax. Trachea and central airways are patent. Upper Abdomen: No acute abnormality. Musculoskeletal: Degenerative changes affect the spine. Review of the MIP images confirms the above findings. IMPRESSION: 1. No evidence for pulmonary embolism. 2. Stable areas of air trapping throughout both lungs. Findings are nonspecific but can be seen in the setting of small airways disease. Electronically Signed   By: Darliss Cheney M.D.   On: 11/07/2023 03:56   DG Chest Port 1 View Result Date: 11/07/2023 CLINICAL DATA:  Shortness of breath EXAM: PORTABLE CHEST 1 VIEW COMPARISON:  Chest x-ray 12/14/2022 FINDINGS: Heart is enlarged. There is no focal lung infiltrate, pleural effusion or pneumothorax. No definite fracture seen. IMPRESSION: Cardiomegaly. No acute pulmonary process. Electronically Signed   By: Darliss Cheney M.D.   On: 11/07/2023 00:40    Microbiology: Results for orders placed or performed during the hospital encounter of 12/14/22  Resp panel by RT-PCR (RSV, Flu A&B, Covid) Anterior Nasal Swab     Status:  None   Collection Time: 12/14/22  3:22 PM   Specimen: Anterior Nasal Swab  Result Value Ref Range Status   SARS Coronavirus 2 by RT PCR NEGATIVE NEGATIVE Final    Comment: (NOTE) SARS-CoV-2 target nucleic acids are NOT DETECTED.  The SARS-CoV-2 RNA is generally detectable in upper respiratory specimens during the acute phase of infection. The lowest concentration of SARS-CoV-2 viral copies this assay can detect is 138 copies/mL. A negative result does not preclude SARS-Cov-2 infection and should not be used as the sole basis for treatment or other patient management decisions. A negative result may occur with  improper specimen collection/handling, submission of specimen other than nasopharyngeal swab, presence of viral mutation(s) within the areas targeted by this assay, and inadequate number of viral copies(<138 copies/mL). A negative result must be combined with clinical observations, patient history, and epidemiological information. The expected result is Negative.  Fact Sheet for Patients:  BloggerCourse.com  Fact Sheet for Healthcare Providers:  SeriousBroker.it  This test is no t yet approved or cleared by the [place] FDA and  has been authorized for detection and/or diagnosis of SARS-CoV-2 by FDA under an Emergency Use Authorization (EUA). This EUA will remain  in effect (meaning this test can be used) for the duration of the COVID-19 declaration under Section 564(b)(1) of the Act,  21 U.S.C.section 360bbb-3(b)(1), unless the authorization is terminated  or revoked sooner.       Influenza A by PCR NEGATIVE NEGATIVE Final   Influenza B by PCR NEGATIVE NEGATIVE Final    Comment: (NOTE) The Xpert Xpress SARS-CoV-2/FLU/RSV plus assay is intended as an aid in the diagnosis of influenza from Nasopharyngeal swab specimens and should not be used as a sole basis for treatment. Nasal washings and aspirates are unacceptable  for Xpert Xpress SARS-CoV-2/FLU/RSV testing.  Fact Sheet for Patients: BloggerCourse.com  Fact Sheet for Healthcare Providers: SeriousBroker.it  This test is not yet approved or cleared by the [place] FDA and has been authorized for detection and/or diagnosis of SARS-CoV-2 by FDA under an Emergency Use Authorization (EUA). This EUA will remain in effect (meaning this test can be used) for the duration of the COVID-19 declaration under Section 564(b)(1) of the Act, 21 U.S.C. section 360bbb-3(b)(1), unless the authorization is terminated or revoked.     Resp Syncytial Virus by PCR NEGATIVE NEGATIVE Final    Comment: (NOTE) Fact Sheet for Patients: BloggerCourse.com  Fact Sheet for Healthcare Providers: SeriousBroker.it  This test is not yet approved or cleared by the [place] FDA and has been authorized for detection and/or diagnosis of SARS-CoV-2 by FDA under an Emergency Use Authorization (EUA). This EUA will remain in effect (meaning this test can be used) for the duration of the COVID-19 declaration under Section 564(b)(1) of the Act, 21 U.S.C. section 360bbb-3(b)(1), unless the authorization is terminated or revoked.  Performed at [facility], [address] [place], [place] [address]     Labs: CBC: Recent Labs  Lab 11/07/23 0014 11/08/23 0607  WBC 10.1 8.1  NEUTROABS 6.3  --   HGB 14.9 14.1  HCT 45.7 43.3  MCV 86.2 86.3  PLT 347 314   Basic Metabolic Panel: Recent Labs  Lab 11/07/23 0224 11/08/23 0607  NA 135 136  K 3.4* 3.8  CL 97* 100  CO2 26 27  GLUCOSE 180* 133*  BUN 21* 16  CREATININE 0.57* 0.62  CALCIUM 8.6* 8.7*  MG 1.9  --    Liver Function Tests: Recent Labs  Lab 11/07/23 0224 11/08/23 0607  AST 28 24  ALT 34 34  ALKPHOS 53 56  BILITOT 0.6 0.9  PROT 6.5 7.1  ALBUMIN 3.1* 3.3*   CBG: Recent Labs  Lab  11/07/23 1116 11/07/23 1706 11/07/23 2324 11/08/23 0756  GLUCAP 146* 136* 116* 120*    Discharge time spent:  .  Signed: Loyce Dys, MD [facility] 11/08/2023

## 2023-11-08 NOTE — ED Notes (Signed)
Patient being seen by pharmacy to go over Eliquis

## 2023-11-08 NOTE — ED Notes (Signed)
 Patient ate lunch.

## 2024-08-11 ENCOUNTER — Other Ambulatory Visit: Payer: Self-pay | Admitting: Family
